# Patient Record
Sex: Male | Born: 1955 | Race: White | Hispanic: No | Marital: Married | Smoking: Former smoker
Health system: Southern US, Community
[De-identification: ages and names within clinical notes are randomized; demographics above are authoritative.]

## PROBLEM LIST (undated history)

## (undated) DIAGNOSIS — I10 Essential (primary) hypertension: Secondary | ICD-10-CM

## (undated) DIAGNOSIS — O99330 Smoking (tobacco) complicating pregnancy, unspecified trimester: Secondary | ICD-10-CM

## (undated) DIAGNOSIS — Z9911 Dependence on respirator [ventilator] status: Secondary | ICD-10-CM

## (undated) DIAGNOSIS — J9610 Chronic respiratory failure, unspecified whether with hypoxia or hypercapnia: Secondary | ICD-10-CM

## (undated) DIAGNOSIS — J151 Pneumonia due to Pseudomonas: Secondary | ICD-10-CM

## (undated) DIAGNOSIS — F32A Depression, unspecified: Secondary | ICD-10-CM

## (undated) DIAGNOSIS — M549 Dorsalgia, unspecified: Secondary | ICD-10-CM

## (undated) DIAGNOSIS — F329 Major depressive disorder, single episode, unspecified: Secondary | ICD-10-CM

## (undated) DIAGNOSIS — F419 Anxiety disorder, unspecified: Secondary | ICD-10-CM

## (undated) DIAGNOSIS — J449 Chronic obstructive pulmonary disease, unspecified: Secondary | ICD-10-CM

## (undated) DIAGNOSIS — K567 Ileus, unspecified: Secondary | ICD-10-CM

---
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
## 2019-10-20 ENCOUNTER — Emergency Department (HOSPITAL_COMMUNITY): Payer: Medicare Other

## 2019-10-20 ENCOUNTER — Inpatient Hospital Stay (HOSPITAL_COMMUNITY)
Admission: EM | Admit: 2019-10-20 | Discharge: 2019-12-02 | DRG: 853 | Disposition: E | Payer: Medicare Other | Attending: Student in an Organized Health Care Education/Training Program | Admitting: Student in an Organized Health Care Education/Training Program

## 2019-10-20 DIAGNOSIS — A419 Sepsis, unspecified organism: Principal | ICD-10-CM | POA: Diagnosis present

## 2019-10-20 DIAGNOSIS — I358 Other nonrheumatic aortic valve disorders: Secondary | ICD-10-CM | POA: Diagnosis present

## 2019-10-20 DIAGNOSIS — I480 Paroxysmal atrial fibrillation: Secondary | ICD-10-CM | POA: Diagnosis present

## 2019-10-20 DIAGNOSIS — I9589 Other hypotension: Secondary | ICD-10-CM | POA: Diagnosis present

## 2019-10-20 DIAGNOSIS — E871 Hypo-osmolality and hyponatremia: Secondary | ICD-10-CM | POA: Diagnosis present

## 2019-10-20 DIAGNOSIS — Z930 Tracheostomy status: Secondary | ICD-10-CM | POA: Diagnosis not present

## 2019-10-20 DIAGNOSIS — K8020 Calculus of gallbladder without cholecystitis without obstruction: Secondary | ICD-10-CM | POA: Diagnosis present

## 2019-10-20 DIAGNOSIS — J9620 Acute and chronic respiratory failure, unspecified whether with hypoxia or hypercapnia: Secondary | ICD-10-CM | POA: Diagnosis not present

## 2019-10-20 DIAGNOSIS — J9601 Acute respiratory failure with hypoxia: Secondary | ICD-10-CM

## 2019-10-20 DIAGNOSIS — J156 Pneumonia due to other aerobic Gram-negative bacteria: Secondary | ICD-10-CM | POA: Diagnosis not present

## 2019-10-20 DIAGNOSIS — J9611 Chronic respiratory failure with hypoxia: Secondary | ICD-10-CM | POA: Diagnosis not present

## 2019-10-20 DIAGNOSIS — I959 Hypotension, unspecified: Secondary | ICD-10-CM | POA: Diagnosis not present

## 2019-10-20 DIAGNOSIS — X58XXXA Exposure to other specified factors, initial encounter: Secondary | ICD-10-CM | POA: Diagnosis not present

## 2019-10-20 DIAGNOSIS — Z801 Family history of malignant neoplasm of trachea, bronchus and lung: Secondary | ICD-10-CM

## 2019-10-20 DIAGNOSIS — F3289 Other specified depressive episodes: Secondary | ICD-10-CM | POA: Diagnosis present

## 2019-10-20 DIAGNOSIS — E875 Hyperkalemia: Secondary | ICD-10-CM | POA: Diagnosis not present

## 2019-10-20 DIAGNOSIS — I493 Ventricular premature depolarization: Secondary | ICD-10-CM | POA: Diagnosis present

## 2019-10-20 DIAGNOSIS — R451 Restlessness and agitation: Secondary | ICD-10-CM | POA: Diagnosis not present

## 2019-10-20 DIAGNOSIS — E785 Hyperlipidemia, unspecified: Secondary | ICD-10-CM | POA: Diagnosis present

## 2019-10-20 DIAGNOSIS — E872 Acidosis, unspecified: Secondary | ICD-10-CM

## 2019-10-20 DIAGNOSIS — Z8701 Personal history of pneumonia (recurrent): Secondary | ICD-10-CM

## 2019-10-20 DIAGNOSIS — Z931 Gastrostomy status: Secondary | ICD-10-CM | POA: Diagnosis not present

## 2019-10-20 DIAGNOSIS — I700 Atherosclerosis of aorta: Secondary | ICD-10-CM | POA: Diagnosis present

## 2019-10-20 DIAGNOSIS — I10 Essential (primary) hypertension: Secondary | ICD-10-CM | POA: Diagnosis present

## 2019-10-20 DIAGNOSIS — K56609 Unspecified intestinal obstruction, unspecified as to partial versus complete obstruction: Secondary | ICD-10-CM | POA: Diagnosis not present

## 2019-10-20 DIAGNOSIS — J9809 Other diseases of bronchus, not elsewhere classified: Secondary | ICD-10-CM | POA: Diagnosis not present

## 2019-10-20 DIAGNOSIS — S80211A Abrasion, right knee, initial encounter: Secondary | ICD-10-CM | POA: Diagnosis not present

## 2019-10-20 DIAGNOSIS — E876 Hypokalemia: Secondary | ICD-10-CM | POA: Diagnosis present

## 2019-10-20 DIAGNOSIS — T17890A Other foreign object in other parts of respiratory tract causing asphyxiation, initial encounter: Secondary | ICD-10-CM | POA: Diagnosis not present

## 2019-10-20 DIAGNOSIS — E861 Hypovolemia: Secondary | ICD-10-CM | POA: Diagnosis present

## 2019-10-20 DIAGNOSIS — R140 Abdominal distension (gaseous): Secondary | ICD-10-CM | POA: Diagnosis present

## 2019-10-20 DIAGNOSIS — J9811 Atelectasis: Secondary | ICD-10-CM | POA: Diagnosis present

## 2019-10-20 DIAGNOSIS — K56600 Partial intestinal obstruction, unspecified as to cause: Secondary | ICD-10-CM | POA: Diagnosis not present

## 2019-10-20 DIAGNOSIS — Z9889 Other specified postprocedural states: Secondary | ICD-10-CM

## 2019-10-20 DIAGNOSIS — G8929 Other chronic pain: Secondary | ICD-10-CM | POA: Diagnosis present

## 2019-10-20 DIAGNOSIS — Z20822 Contact with and (suspected) exposure to covid-19: Secondary | ICD-10-CM | POA: Diagnosis not present

## 2019-10-20 DIAGNOSIS — Z9911 Dependence on respirator [ventilator] status: Secondary | ICD-10-CM | POA: Diagnosis not present

## 2019-10-20 DIAGNOSIS — F329 Major depressive disorder, single episode, unspecified: Secondary | ICD-10-CM | POA: Diagnosis not present

## 2019-10-20 DIAGNOSIS — J95851 Ventilator associated pneumonia: Secondary | ICD-10-CM | POA: Diagnosis not present

## 2019-10-20 DIAGNOSIS — E870 Hyperosmolality and hypernatremia: Secondary | ICD-10-CM | POA: Diagnosis not present

## 2019-10-20 DIAGNOSIS — J9690 Respiratory failure, unspecified, unspecified whether with hypoxia or hypercapnia: Secondary | ICD-10-CM | POA: Diagnosis not present

## 2019-10-20 DIAGNOSIS — S80219A Abrasion, unspecified knee, initial encounter: Secondary | ICD-10-CM | POA: Diagnosis not present

## 2019-10-20 DIAGNOSIS — E162 Hypoglycemia, unspecified: Secondary | ICD-10-CM | POA: Diagnosis present

## 2019-10-20 DIAGNOSIS — J440 Chronic obstructive pulmonary disease with acute lower respiratory infection: Secondary | ICD-10-CM | POA: Diagnosis not present

## 2019-10-20 DIAGNOSIS — J9622 Acute and chronic respiratory failure with hypercapnia: Secondary | ICD-10-CM | POA: Diagnosis present

## 2019-10-20 DIAGNOSIS — Z515 Encounter for palliative care: Secondary | ICD-10-CM | POA: Diagnosis not present

## 2019-10-20 DIAGNOSIS — Y848 Other medical procedures as the cause of abnormal reaction of the patient, or of later complication, without mention of misadventure at the time of the procedure: Secondary | ICD-10-CM | POA: Diagnosis present

## 2019-10-20 DIAGNOSIS — J690 Pneumonitis due to inhalation of food and vomit: Secondary | ICD-10-CM | POA: Diagnosis not present

## 2019-10-20 DIAGNOSIS — I4891 Unspecified atrial fibrillation: Secondary | ICD-10-CM | POA: Diagnosis not present

## 2019-10-20 DIAGNOSIS — I878 Other specified disorders of veins: Secondary | ICD-10-CM | POA: Diagnosis present

## 2019-10-20 DIAGNOSIS — J189 Pneumonia, unspecified organism: Secondary | ICD-10-CM

## 2019-10-20 DIAGNOSIS — J9600 Acute respiratory failure, unspecified whether with hypoxia or hypercapnia: Secondary | ICD-10-CM

## 2019-10-20 DIAGNOSIS — E860 Dehydration: Secondary | ICD-10-CM | POA: Diagnosis present

## 2019-10-20 DIAGNOSIS — R000 Tachycardia, unspecified: Secondary | ICD-10-CM | POA: Diagnosis not present

## 2019-10-20 DIAGNOSIS — L89151 Pressure ulcer of sacral region, stage 1: Secondary | ICD-10-CM | POA: Diagnosis not present

## 2019-10-20 DIAGNOSIS — M545 Low back pain: Secondary | ICD-10-CM | POA: Diagnosis present

## 2019-10-20 DIAGNOSIS — J9621 Acute and chronic respiratory failure with hypoxia: Secondary | ICD-10-CM | POA: Diagnosis not present

## 2019-10-20 DIAGNOSIS — F419 Anxiety disorder, unspecified: Secondary | ICD-10-CM | POA: Diagnosis not present

## 2019-10-20 DIAGNOSIS — K567 Ileus, unspecified: Secondary | ICD-10-CM

## 2019-10-20 DIAGNOSIS — Z8249 Family history of ischemic heart disease and other diseases of the circulatory system: Secondary | ICD-10-CM

## 2019-10-20 DIAGNOSIS — R0602 Shortness of breath: Secondary | ICD-10-CM

## 2019-10-20 DIAGNOSIS — J449 Chronic obstructive pulmonary disease, unspecified: Secondary | ICD-10-CM | POA: Diagnosis not present

## 2019-10-20 HISTORY — DX: Ileus, unspecified: K56.7

## 2019-10-20 HISTORY — DX: Essential (primary) hypertension: I10

## 2019-10-20 HISTORY — DX: Depression, unspecified: F32.A

## 2019-10-20 HISTORY — DX: Chronic obstructive pulmonary disease, unspecified: J44.9

## 2019-10-20 HISTORY — DX: Smoking (tobacco) complicating pregnancy, unspecified trimester: O99.330

## 2019-10-20 HISTORY — DX: Chronic respiratory failure, unspecified whether with hypoxia or hypercapnia: J96.10

## 2019-10-20 HISTORY — DX: Dependence on respirator (ventilator) status: Z99.11

## 2019-10-20 HISTORY — DX: Dorsalgia, unspecified: M54.9

## 2019-10-20 HISTORY — DX: Pneumonia due to Pseudomonas: J15.1

## 2019-10-20 HISTORY — DX: Anxiety disorder, unspecified: F41.9

## 2019-10-20 LAB — POCT I-STAT 7, (LYTES, BLD GAS, ICA,H+H)
Acid-Base Excess: 6 mmol/L — ABNORMAL HIGH (ref 0.0–2.0)
Bicarbonate: 29.1 mmol/L — ABNORMAL HIGH (ref 20.0–28.0)
Calcium, Ion: 1.11 mmol/L — ABNORMAL LOW (ref 1.15–1.40)
HCT: 42 % (ref 39.0–52.0)
Hemoglobin: 14.3 g/dL (ref 13.0–17.0)
O2 Saturation: 93 %
Patient temperature: 98.6
Potassium: 2.8 mmol/L — ABNORMAL LOW (ref 3.5–5.1)
Sodium: 123 mmol/L — ABNORMAL LOW (ref 135–145)
TCO2: 30 mmol/L (ref 22–32)
pCO2 arterial: 36.4 mmHg (ref 32.0–48.0)
pH, Arterial: 7.511 — ABNORMAL HIGH (ref 7.350–7.450)
pO2, Arterial: 59 mmHg — ABNORMAL LOW (ref 83.0–108.0)

## 2019-10-20 LAB — CBC WITH DIFFERENTIAL/PLATELET
Abs Immature Granulocytes: 0.06 10*3/uL (ref 0.00–0.07)
Basophils Absolute: 0.1 10*3/uL (ref 0.0–0.1)
Basophils Relative: 1 %
Eosinophils Absolute: 0.2 10*3/uL (ref 0.0–0.5)
Eosinophils Relative: 2 %
HCT: 43.5 % (ref 39.0–52.0)
Hemoglobin: 14.6 g/dL (ref 13.0–17.0)
Immature Granulocytes: 1 %
Lymphocytes Relative: 22 %
Lymphs Abs: 2.3 10*3/uL (ref 0.7–4.0)
MCH: 31.1 pg (ref 26.0–34.0)
MCHC: 33.6 g/dL (ref 30.0–36.0)
MCV: 92.8 fL (ref 80.0–100.0)
Monocytes Absolute: 1.2 10*3/uL — ABNORMAL HIGH (ref 0.1–1.0)
Monocytes Relative: 11 %
Neutro Abs: 6.6 10*3/uL (ref 1.7–7.7)
Neutrophils Relative %: 63 %
Platelets: 359 10*3/uL (ref 150–400)
RBC: 4.69 MIL/uL (ref 4.22–5.81)
RDW: 13 % (ref 11.5–15.5)
WBC: 10.3 10*3/uL (ref 4.0–10.5)
nRBC: 0 % (ref 0.0–0.2)

## 2019-10-20 LAB — COMPREHENSIVE METABOLIC PANEL
ALT: 29 U/L (ref 0–44)
AST: 27 U/L (ref 15–41)
Albumin: 2.9 g/dL — ABNORMAL LOW (ref 3.5–5.0)
Alkaline Phosphatase: 126 U/L (ref 38–126)
Anion gap: 16 — ABNORMAL HIGH (ref 5–15)
BUN: 48 mg/dL — ABNORMAL HIGH (ref 8–23)
CO2: 29 mmol/L (ref 22–32)
Calcium: 8.9 mg/dL (ref 8.9–10.3)
Chloride: 80 mmol/L — ABNORMAL LOW (ref 98–111)
Creatinine, Ser: 0.8 mg/dL (ref 0.61–1.24)
GFR calc Af Amer: >60 mL/min (ref >60)
GFR calc non Af Amer: >60 mL/min (ref >60)
Glucose, Bld: 53 mg/dL — ABNORMAL LOW (ref 70–99)
Potassium: 3.2 mmol/L — ABNORMAL LOW (ref 3.5–5.1)
Sodium: 125 mmol/L — ABNORMAL LOW (ref 135–145)
Total Bilirubin: 1.6 mg/dL — ABNORMAL HIGH (ref 0.3–1.2)
Total Protein: 7.7 g/dL (ref 6.5–8.1)

## 2019-10-20 LAB — BRAIN NATRIURETIC PEPTIDE: B Natriuretic Peptide: 403 pg/mL — ABNORMAL HIGH (ref 0.0–100.0)

## 2019-10-20 LAB — URINALYSIS, ROUTINE W REFLEX MICROSCOPIC
Bilirubin Urine: NEGATIVE
Glucose, UA: NEGATIVE mg/dL
Hgb urine dipstick: NEGATIVE
Ketones, ur: NEGATIVE mg/dL
Leukocytes,Ua: NEGATIVE
Nitrite: NEGATIVE
Protein, ur: NEGATIVE mg/dL
Specific Gravity, Urine: 1.006 (ref 1.005–1.030)
pH: 6 (ref 5.0–8.0)

## 2019-10-20 LAB — PROTIME-INR
INR: 1.1 (ref 0.8–1.2)
Prothrombin Time: 14.4 seconds (ref 11.4–15.2)

## 2019-10-20 LAB — LACTIC ACID, PLASMA
Lactic Acid, Venous: 4.2 mmol/L (ref 0.5–1.9)
Lactic Acid, Venous: 4.4 mmol/L (ref 0.5–1.9)

## 2019-10-20 LAB — TROPONIN I (HIGH SENSITIVITY): Troponin I (High Sensitivity): 20 ng/L — ABNORMAL HIGH (ref <18)

## 2019-10-20 LAB — CBG MONITORING, ED
Glucose-Capillary: 65 mg/dL — ABNORMAL LOW (ref 70–99)
Glucose-Capillary: 112 mg/dL — ABNORMAL HIGH (ref 70–99)

## 2019-10-20 LAB — APTT: aPTT: 35 seconds (ref 24–36)

## 2019-10-20 LAB — MAGNESIUM: Magnesium: 1.5 mg/dL — ABNORMAL LOW (ref 1.7–2.4)

## 2019-10-20 MED ORDER — LACTATED RINGERS IV BOLUS
1000.0000 mL | Freq: Once | INTRAVENOUS | Status: AC
  Administered 2019-10-20: 1000 mL via INTRAVENOUS

## 2019-10-20 MED ORDER — SODIUM CHLORIDE 0.9 % IV SOLN
2.0000 g | Freq: Three times a day (TID) | INTRAVENOUS | Status: DC
  Administered 2019-10-21 – 2019-10-24 (×10): 2 g via INTRAVENOUS
  Filled 2019-10-20 (×12): qty 2

## 2019-10-20 MED ORDER — SODIUM CHLORIDE 0.9 % IV SOLN
2.0000 g | Freq: Once | INTRAVENOUS | Status: AC
  Administered 2019-10-20: 2 g via INTRAVENOUS
  Filled 2019-10-20: qty 2

## 2019-10-20 MED ORDER — MAGNESIUM SULFATE 2 GM/50ML IV SOLN
2.0000 g | Freq: Once | INTRAVENOUS | Status: AC
  Administered 2019-10-20: 2 g via INTRAVENOUS
  Filled 2019-10-20: qty 50

## 2019-10-20 MED ORDER — FENTANYL CITRATE (PF) 100 MCG/2ML IJ SOLN
50.0000 ug | Freq: Once | INTRAMUSCULAR | Status: AC
  Administered 2019-10-20: 50 ug via INTRAVENOUS
  Filled 2019-10-20: qty 2

## 2019-10-20 MED ORDER — POTASSIUM CHLORIDE 10 MEQ/100ML IV SOLN
10.0000 meq | Freq: Once | INTRAVENOUS | Status: AC
  Administered 2019-10-20: 10 meq via INTRAVENOUS
  Filled 2019-10-20: qty 100

## 2019-10-20 MED ORDER — VANCOMYCIN HCL IN DEXTROSE 1-5 GM/200ML-% IV SOLN
1000.0000 mg | Freq: Once | INTRAVENOUS | Status: AC
  Administered 2019-10-20: 1000 mg via INTRAVENOUS
  Filled 2019-10-20: qty 200

## 2019-10-20 MED ORDER — VANCOMYCIN HCL 1250 MG/250ML IV SOLN
1250.0000 mg | Freq: Two times a day (BID) | INTRAVENOUS | Status: DC
  Administered 2019-10-21 – 2019-10-22 (×3): 1250 mg via INTRAVENOUS
  Filled 2019-10-20 (×6): qty 250

## 2019-10-20 MED ORDER — METRONIDAZOLE IN NACL 5-0.79 MG/ML-% IV SOLN
500.0000 mg | Freq: Once | INTRAVENOUS | Status: AC
  Administered 2019-10-20: 500 mg via INTRAVENOUS
  Filled 2019-10-20: qty 100

## 2019-10-20 MED ORDER — NOREPINEPHRINE 4 MG/250ML-% IV SOLN: 0.0000 ug/min | INTRAVENOUS | Status: DC

## 2019-10-20 MED ORDER — DEXTROSE 50 % IV SOLN
50.0000 mL | Freq: Once | INTRAVENOUS | Status: AC
  Administered 2019-10-20: 50 mL via INTRAVENOUS
  Filled 2019-10-20: qty 50

## 2019-10-20 NOTE — ED Triage Notes (Signed)
Pt arrives via [facility] from [facility] with levophed already started due to hypotension of 70 systolic. Per [facility], pt has been hypotensive all day despite 1L NS bolus that was given. Pt transferred to [facility] from [facility] after failing bipap for hypercapnia and hypoxia. Hx COPD. Pt was intubated, extubated, intubated and then trach with vent dependence. New onset afib rvr yesterday and given metoprolol to try to correct. Pt also with + ileus, NG tube placed.  HR 120 afib 94% vent 110/63 on levophed

## 2019-10-20 NOTE — Consult Note (Signed)
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  This improved with IV fluids.  Imaging right lower lobe consolidation.  Admit to progressive care with PCCM consult for vent management  History of present illness   Brady [name] is a 64 year old male who had Pseudomonas/Serratia pneumonia at [facility] in February 2021 and required tracheostomy.  He was transferred to [facility] and remains vent dependent.  Patient apparently developed an ileus over the last several days and then atrial fibrillation today.  He was given metoprolol (unclear dose) and developed hypotension.  He was given 1 L IV fluids, started on Levophed and transferred to the emergency department.   In the emergency department, he has not required any further vasopressors.  CT chest shows complete collapse of the right lower lobe with right upper lobe consolidation concerning for aspiration.  He was covered with with broad-spectrum antibiotics with vancomycin, cefepime and Flagyl.  CT abdomen pelvis shows ileus.  His overall status was stable, therefore internal medicine consulted for admission and PCCM consulted for vent management.  Covid-19 results are pending  Past Medical History  Spinal stenosis, hepatitis C, hypertension, chronic respiratory failure trach and vent dependent  Significant Hospital Events   3/19-admit to progressive care  Consults:  PCCM  Procedures:    Significant Diagnostic Tests:  3/19 CT abdomen pelvis>>Mildly dilated fluid-filled loops of proximal and mid small bowel with transition to decompressed distal and terminal ileum suggestive  of low-grade bowel obstruction 3/19 CT chest>>Complete collapse of the RLL secondary to aspirated debris within the RLL bronchus. Consolidation in the posterior segments of the RUL concerning for aspiration.Cholelithiasis with questionable gallbladder wall thickening.   Micro Data:  3/19 SARS-Covid-2>> pending 3/19 urine culture>>  3/19 blood cultures x2>>  Antimicrobials:  Cefepime 3/19- Flagyl 3/19- Vancomycin 3/19-  Interim history/subjective:  Patient's blood pressure improved, awake and alert, remains in atrial fibrillation without respiratory distress  Objective   Blood pressure 116/86, pulse (!) 113, temperature 98.8 F (37.1 C), resp. rate (!) 22, height 5\' 8"  (1.727 m), weight 103 kg, SpO2 93 %.    Vent Mode: PRVC FiO2 (%):  [35 %] 35 % Set Rate:  [12 bmp] 12 bmp Vt Set:  [500 mL] 500 mL PEEP:  [5 cmH20] 5 cmH20  No intake or output data in the 24 hours ending 11-06-19 2354 Filed Weights   11-06-19 2125  Weight: 103 kg   General: Chronically ill-appearing male in no acute distress HEENT: MM pink/moist, trach in place Neuro: Awake and responsive CV: s1s2 regular rate and tachycardic, no m/r/g PULM: Decreased air movement right upper and lower lobes GI: soft, bsx4 active  Extremities: warm/dry, no edema  Skin: no rashes or lesions   Resolved Hospital Problem list   Hypotension  Assessment & Plan:    Right lower lobe collapse, chronic trach and vent dependent -Likely mucous plugging and aspiration event, history of Serratia and Pseudomonas pneumonia -ABG 7.5/36.4/59/29.1 on baseline vent setting and 35% FiO2 P: -Agree with broad antibiotic coverage with Vanco and cefepime and blood cultures -Obtain respiratory culture, would likely benefit from bronchoscopy, will attempt to complete overnight -Pulmonary toilet -Increase FiO2 to 50% and repeat ABG  in the morning -titrate Vent setting to maintain SpO2 greater than or equal to 90%. -HOB elevated 30  degrees. -Plateau pressures less than 30 cm H20.  -Follow chest x-ray, ABG prn.   -Bronchial hygiene and RT/bronchodilator protocol.  Atrial fibrillation -Appears to be new onset, possibly secondary to developing sepsis in the setting of aspiration pneumonia -Unclear how much metoprolol patient was given prior to hypotension before arrival P: -Management per primary team, consider echo, TSH, cards consult -CHA2DS-VASc2 score is 1  -suggest low dose Metoprolol 6.25 mg twice daily or cardizem gtt if RVR uncontrolled and BP will tolerate   Ileus -Recommend NG tube to gravity -N.p.o. -Bowel regimen  Best practice:  Diet: N.p.o. Pain/Anxiety/Delirium protocol (if indicated): N/A VAP protocol (if indicated): Head of bed 30 degrees DVT prophylaxis: Lovenox GI prophylaxis: N/A Glucose control: N/A Mobility: Bedrest Code Status: Full code Family Communication: Per primary Disposition: Progressive care  Labs   CBC: Recent Labs  Lab 10/05/2019 1902 10/19/2019 1916  WBC  --  10.3  NEUTROABS  --  6.6  HGB 14.3 14.6  HCT 42.0 43.5  MCV  --  92.8  PLT  --  359    Basic Metabolic Panel: Recent Labs  Lab 10/18/2019 1902 10/16/2019 1916  NA 123* 125*  K 2.8* 3.2*  CL  --  80*  CO2  --  29  GLUCOSE  --  53*  BUN  --  48*  CREATININE  --  0.80  CALCIUM  --  8.9  MG  --  1.5*   GFR: Estimated Creatinine Clearance: 108.5 mL/min (by C-G formula based on SCr of 0.8 mg/dL). Recent Labs  Lab 10/07/2019 1916 10/09/2019 2112  WBC 10.3  --   LATICACIDVEN 4.2* 4.4*    Liver Function Tests: Recent Labs  Lab 10/30/2019 1916  AST 27  ALT 29  ALKPHOS 126  BILITOT 1.6*  PROT 7.7  ALBUMIN 2.9*   No results for input(s): LIPASE, AMYLASE in the last 168 hours. No results for input(s): AMMONIA in the last 168 hours.  ABG    Component Value Date/Time   PHART 7.511 (H) 10/13/2019 1902   PCO2ART 36.4 10/29/2019 1902   PO2ART 59.0 (L) 10/27/2019 1902   HCO3 29.1 (H) 10/21/2019 1902    TCO2 30 10/27/2019 1902   O2SAT 93.0 10/22/2019 1902     Coagulation Profile: Recent Labs  Lab 10/19/2019 1916  INR 1.1    Cardiac Enzymes: No results for input(s): CKTOTAL, CKMB, CKMBINDEX, TROPONINI in the last 168 hours.  HbA1C: No results found for: HGBA1C  CBG: Recent Labs  Lab 10/23/2019 2017 10/23/2019 2132  GLUCAP 65* 112*    Review of Systems:   Unable to obtain secondary to no Passy Marc Morgans  Past Medical History  He,  has no past medical history on file.   Surgical History     Social History      Family History   His family history is not on file.   Allergies Not on File   Home Medications  Prior to Admission medications   Not on File     Critical care time: 50 minutes     CRITICAL CARE Performed by: Darcella Gasman Denyce Harr   Total critical care time: 50 minutes  Critical care time was exclusive of separately billable procedures and treating other patients.  Critical care was necessary to treat or prevent imminent or life-threatening deterioration.  Critical care was time spent personally by me on the following  activities: development of treatment plan with patient and/or surrogate as well as nursing, discussions with consultants, evaluation of patient's response to treatment, examination of patient, obtaining history from patient or surrogate, ordering and performing treatments and interventions, ordering and review of laboratory studies, ordering and review of radiographic studies, pulse oximetry and re-evaluation of patient's condition.  Otilio Carpen Katherleen Folkes, PA-C

## 2019-10-20 NOTE — ED Provider Notes (Signed)
[facility] EMERGENCY DEPARTMENT Provider Note   CSN: [id number] Arrival date & time: 10/27/2019  1832  LEVEL 5 CAVEAT - NONVERBAL/TRACH  History Chief Complaint  Patient presents with  . Hypotension  . Atrial Fibrillation    Brady [name] is a 64 y.o. male.  HPI 64 year old male presents with atrial fibrillation from [facility].  Patient new to [facility] over the last month or so.  Over the last couple days he has developed an ileus and currently has his PEG tube and an NG set up to suction.  Apparently a lot of fluid has come out.  The patient developed A. fib yesterday according to discharge summary sent from [facility].  Was given metoprolol and fluids.  Developed hypotension that did not come up with fluids and so [facility] was called to transfer to the ER.  They placed on Levophed with good response.  He is vent dependent as he intermittently has apnea.  Further history is pretty limited.  No past medical history on file.  Patient Active Problem List   Diagnosis Date Noted  . Hypotension 10/19/2019         No family history on file.  Social History   Tobacco Use  . Smoking status: Not on file  Substance Use Topics  . Alcohol use: Not on file  . Drug use: Not on file    Home Medications Prior to Admission medications   Not on File    Allergies    Patient has no allergy information on record.  Review of Systems   Review of Systems  Unable to perform ROS: Patient nonverbal    Physical Exam Updated Vital Signs BP 120/84   Pulse (!) 130   Temp 98.8 F (37.1 C)   Resp (!) 22   Ht 5\' 8"  (1.727 m)   Wt 103 kg   SpO2 94%   BMI 34.53 kg/m   Physical Exam Vitals and nursing note reviewed.  Constitutional:      Appearance: He is well-developed.  HENT:     Head: Normocephalic and atraumatic.     Right Ear: External ear normal.     Left Ear: External ear normal.     Nose: Nose normal.  Eyes:     General:        Right eye: No discharge.         Left eye: No discharge.  Neck:     Comments: Tracheostomy in place Cardiovascular:     Rate and Rhythm: Tachycardia present. Rhythm irregular.     Heart sounds: Normal heart sounds.  Pulmonary:     Effort: Pulmonary effort is normal.     Breath sounds: Normal breath sounds.  Abdominal:     Palpations: Abdomen is soft.     Tenderness: There is no abdominal tenderness.  Musculoskeletal:     Cervical back: Neck supple.  Skin:    General: Skin is warm and dry.  Neurological:     Mental Status: He is alert.  Psychiatric:        Mood and Affect: Mood is not anxious.     ED Results / Procedures / Treatments   Labs (all labs ordered are listed, but only abnormal results are displayed) Labs Reviewed  LACTIC ACID, PLASMA - Abnormal; Notable for the following components:      Result Value   Lactic Acid, Venous 4.2 (*)    All other components within normal limits  LACTIC ACID, PLASMA - Abnormal; Notable for the following  components:   Lactic Acid, Venous 4.4 (*)    All other components within normal limits  COMPREHENSIVE METABOLIC PANEL - Abnormal; Notable for the following components:   Sodium 125 (*)    Potassium 3.2 (*)    Chloride 80 (*)    Glucose, Bld 53 (*)    BUN 48 (*)    Albumin 2.9 (*)    Total Bilirubin 1.6 (*)    Anion gap 16 (*)    All other components within normal limits  CBC WITH DIFFERENTIAL/PLATELET - Abnormal; Notable for the following components:   Monocytes Absolute 1.2 (*)    All other components within normal limits  MAGNESIUM - Abnormal; Notable for the following components:   Magnesium 1.5 (*)    All other components within normal limits  BRAIN NATRIURETIC PEPTIDE - Abnormal; Notable for the following components:   B Natriuretic Peptide 403.0 (*)    All other components within normal limits  POCT I-STAT 7, (LYTES, BLD GAS, ICA,H+H) - Abnormal; Notable for the following components:   pH, Arterial 7.511 (*)    pO2, Arterial 59.0 (*)     Bicarbonate 29.1 (*)    Acid-Base Excess 6.0 (*)    Sodium 123 (*)    Potassium 2.8 (*)    Calcium, Ion 1.11 (*)    All other components within normal limits  CBG MONITORING, ED - Abnormal; Notable for the following components:   Glucose-Capillary 65 (*)    All other components within normal limits  CBG MONITORING, ED - Abnormal; Notable for the following components:   Glucose-Capillary 112 (*)    All other components within normal limits  TROPONIN I (HIGH SENSITIVITY) - Abnormal; Notable for the following components:   Troponin I (High Sensitivity) 20 (*)    All other components within normal limits  CULTURE, BLOOD (ROUTINE X 2)  CULTURE, BLOOD (ROUTINE X 2)  URINE CULTURE  SARS CORONAVIRUS 2 (TAT 6-24 HRS)  APTT  PROTIME-INR  URINALYSIS, ROUTINE W REFLEX MICROSCOPIC  I-STAT ARTERIAL BLOOD GAS, ED  TROPONIN I (HIGH SENSITIVITY)    EKG EKG Interpretation  Date/Time:  Friday November 14, 2019 18:34:33 EDT Ventricular Rate:  115 PR Interval:    QRS Duration: 108 QT Interval:  342 QTC Calculation: 473 R Axis:   23 Text Interpretation: Atrial fibrillation Probable anteroseptal infarct, old Borderline repolarization abnormality No old tracing to compare Confirmed by Pricilla Loveless [phone]) on 11-14-2019 7:05:34 PM   Radiology CT ABDOMEN PELVIS WO CONTRAST  Result Date: 2019-11-14 CLINICAL DATA:  Nausea vomiting EXAM: CT ABDOMEN AND PELVIS WITHOUT CONTRAST TECHNIQUE: Multidetector CT imaging of the abdomen and pelvis was performed following the standard protocol without IV contrast. COMPARISON:  Chest x-ray 11/14/19 FINDINGS: Lower chest: Lung bases demonstrate consolidation within the right lower lobe. Normal heart size. Coronary vascular calcification Hepatobiliary: No focal hepatic abnormality. Slightly distended gallbladder with layering stones or sludge. No biliary dilatation Pancreas: Atrophic.  No inflammatory change Spleen: Normal in size without focal abnormality.  Adrenals/Urinary Tract: Adrenal glands are normal. Negative for hydronephrosis. The bladder is unremarkable Stomach/Bowel: Esophageal tube tip at the proximal duodenum. Gastrostomy tube in the distal stomach. Mildly dilated fluid-filled small bowel with gradual transition to decompressed distal small bowel and terminal ileum in the right lower quadrant of the abdomen, series 3, image number 44. No bowel wall thickening. Moderate stool in the colon. Negative appendix. Vascular/Lymphatic: Extensive aortic atherosclerosis. No aneurysm. No significant adenopathy Reproductive: Prostate is unremarkable. Other: Negative for free air or  free fluid. Musculoskeletal: Fixating screw at L5. Chronic appearing deformity at L3 with posterior wedging of the vertebral body. Advanced degenerative changes throughout the lumbar spine. Ghost tracks within the pedicles and vertebra from L1 to S1 consistent with prior surgical hardware and posterior decompression. Probable scar tissue within the posterior spinal region. Suspected 2 by 3.9 cm fluid collection within the posterior paraspinal soft tissues. IMPRESSION: 1. Consolidation in the right lower lobe which may be secondary to atelectasis, pneumonia, or aspiration. Central obstructing process is also possible as this is incompletely visualized. Consider dedicated chest CT for further evaluation. 2. Mildly dilated fluid-filled loops of proximal and mid small bowel with transition to decompressed distal and terminal ileum suggestive of low-grade bowel obstruction, less likely ileus. 3. Evidence of prior lumbar spine surgery and hardware removal. Soft tissue thickening within the posterior paraspinal soft tissues presumably due to postsurgical change; there is a 2 x 3.9 cm nonspecific fluid collection centrally within the soft tissue changes 4. Gallstones and or sludge. Electronically Signed   By: Jasmine PangKim  Fujinaga M.D.   On: 10/28/2019 20:31   CT Chest Wo Contrast  Result Date:  10/21/2019 CLINICAL DATA:  Shortness of breath.  Respiratory failure. EXAM: CT CHEST WITHOUT CONTRAST TECHNIQUE: Multidetector CT imaging of the chest was performed following the standard protocol without IV contrast. COMPARISON:  None. FINDINGS: Cardiovascular: The heart size is normal. Aortic calcifications are noted without evidence for thoracic aortic aneurysm. Advanced coronary artery calcifications are noted. There is no significant pericardial effusion. Mediastinum/Nodes: --No mediastinal or hilar lymphadenopathy. --No axillary lymphadenopathy. --No supraclavicular lymphadenopathy. --Normal thyroid gland. --there is an enteric tube that extends through the patient's esophagus and terminates in the region of the gastric pylorus. Lungs/Pleura: There is a tracheostomy tube in place that terminates above the carina. There is no pneumothorax. There is some atelectasis at the left lung base. There is essentially complete collapse of the right lower lobe which appears to be secondary to extensive endobronchial debris and plugging of the right lower lobe bronchus. There is some attenuation of the right middle lobe bronchus with persistent adequate aeration of the the right middle lobe. There is mild consolidation in the posterior segments of the right upper lobe, likely representing aspiration. Upper Abdomen: There is cholelithiasis with questionable gallbladder wall thickening. A gastrostomy tube is noted. Musculoskeletal: There is an old healed proximal right humerus fracture. There are advanced degenerative changes of the left glenohumeral joint. Review of the MIP images confirms the above findings. IMPRESSION: 1. Complete collapse of the right lower lobe secondary to aspirated debris within the right lower lobe bronchus. 2. Consolidation in the posterior segments of the right upper lobe concerning for aspiration. 3. Cholelithiasis with questionable gallbladder wall thickening. Correlation with ultrasound is  recommended. 4.  Aortic Atherosclerosis (ICD10-I70.0). Electronically Signed   By: Katherine Mantlehristopher  Green M.D.   On: 10/25/2019 22:45   DG Chest Port 1 View  Result Date: 10/29/2019 CLINICAL DATA:  Hypotension EXAM: PORTABLE CHEST 1 VIEW COMPARISON:  None. FINDINGS: The tracheostomy tube terminates above the carina. The tip of the enteric tube is not fully visualized on this study. The patient is rotated which significantly limits evaluation. There is suggestion of a right perihilar and right lower lung zone airspace opacity. There is no pneumothorax. There is mild vascular congestion. The cardiac silhouette appears to be at least mildly enlarged. Advanced degenerative changes are noted of the glenohumeral joints. IMPRESSION: 1. Limited study secondary to patient positioning. 2. Lines and tubes as  above. The tip of the enteric tube is not reliably identified on this study. 3. Possible right lower lobe and right perihilar airspace opacities. These would be better evaluated with a repeat radiograph with improved patient positioning. Hypotension Electronically Signed   By: Katherine Mantle M.D.   On: 11/03/2019 19:20    Procedures .Critical Care Performed by: Pricilla Loveless, MD Authorized by: Pricilla Loveless, MD   Critical care provider statement:    Critical care time (minutes):  45   Critical care time was exclusive of:  Separately billable procedures and treating other patients   Critical care was necessary to treat or prevent imminent or life-threatening deterioration of the following conditions:  Shock and dehydration   Critical care was time spent personally by me on the following activities:  Discussions with consultants, evaluation of patient's response to treatment, examination of patient, ordering and performing treatments and interventions, ordering and review of laboratory studies, ordering and review of radiographic studies, pulse oximetry, re-evaluation of patient's condition, obtaining  history from patient or surrogate and review of old charts   (including critical care time)  Medications Ordered in ED Medications  norepinephrine (LEVOPHED) 4mg  in premix infusion (0 mcg/min Intravenous Not Given 11/03/2019 2046)  ceFEPIme (MAXIPIME) 2 g in sodium chloride 0.9 % 100 mL IVPB (has no administration in time range)  vancomycin (VANCOREADY) IVPB 1250 mg/250 mL (has no administration in time range)  lactated ringers bolus 1,000 mL (0 mLs Intravenous Stopped 03-Nov-2019 2018)  ceFEPIme (MAXIPIME) 2 g in sodium chloride 0.9 % 100 mL IVPB (0 g Intravenous Stopped 11-03-19 2052)  metroNIDAZOLE (FLAGYL) IVPB 500 mg (0 mg Intravenous Stopped 2019-11-03 2018)  vancomycin (VANCOCIN) IVPB 1000 mg/200 mL premix (0 mg Intravenous Stopped 2019/11/03 2031)  potassium chloride 10 mEq in 100 mL IVPB (0 mEq Intravenous Stopped 11-03-19 2039)  vancomycin (VANCOCIN) IVPB 1000 mg/200 mL premix (0 mg Intravenous Stopped 11/03/2019 2130)  fentaNYL (SUBLIMAZE) injection 50 mcg (50 mcg Intravenous Given 11/03/19 2023)  magnesium sulfate IVPB 2 g 50 mL (0 g Intravenous Stopped 2019/11/03 2046)  lactated ringers bolus 1,000 mL (0 mLs Intravenous Stopped 11-03-19 2132)  dextrose 50 % solution 50 mL (50 mLs Intravenous Given November 03, 2019 2027)  potassium chloride 10 mEq in 100 mL IVPB (0 mEq Intravenous Stopped 03-Nov-2019 2234)  fentaNYL (SUBLIMAZE) injection 50 mcg (50 mcg Intravenous Given November 03, 2019 2223)  lactated ringers bolus 1,000 mL (0 mLs Intravenous Stopped Nov 03, 2019 2332)    ED Course  I have reviewed the triage vital signs and the nursing notes.  Pertinent labs & imaging results that were available during my care of the patient were reviewed by me and considered in my medical decision making (see chart for details).  Clinical Course as of Oct 20 17  Fri 2019-11-03  2110 D/w Dr. Oct 22, 2019. Pulm can manage vent, but otherwise would be fine for stepdown. Ask for more fluid (need weight - go to 30 cc/kg) and admission to  medicine. Feels it's probably more volume loss from ileus/SBO rather than sepsis.   [SG]    Clinical Course User Index [SG] Warrick Parisian, MD   MDM Rules/Calculators/A&P                      CT shows SBO. Given IVF. Given hypotension and unclear cause at initial onset, he was treated per sepsis protocol with broad antibiotics. When weight was placed he was given 3rd liter IVF. Quickly weaned of levophed prior to any  significant fluid administration.  CT chest added on given the abnormal chest findings.  I think he should keep the antibiotics given the possible pneumonia.  Otherwise, he is no longer hypotensive.  While he does have A. fib and this appears to be new, I do not think this is driving his hypotension and is likely a response to his hypokalemia and dehydration.  Internal medicine teaching service consulted and they will admit. Final Clinical Impression(s) / ED Diagnoses Final diagnoses:  Atrial fibrillation with RVR (HCC)  Lactic acidosis  Small bowel obstruction [facility])    Rx / DC Orders ED Discharge Orders    None       Sherwood Gambler, MD 10/21/19 [phone]

## 2019-10-20 NOTE — ED Notes (Signed)
Patient requested position change and to be adjusted in the bed. Multiple attempts made but patient remains unsatisfied. Had the secretary, Geraldine Contras order a bed with mattress for wounds.

## 2019-10-20 NOTE — ED Notes (Signed)
Admitting MD at bedside evaluating patient

## 2019-10-20 NOTE — Progress Notes (Signed)
Pharmacy Antibiotic Note  Brady [name] is a 64 y.o. male admitted on 10-22-19 with sepsis. Pharmacy has been consulted for vancomycin and cefepime dosing.  Pt is hypotensive and bradycardic on levophed from [facility].  WBC 10.3, Tmax 98.8, lactate 4.2  Plan: Vancomycin 2000mg  IV x1 loading dose then 1250mg  IV Q12h Goal trough 15-20 mcg/mL Goal AUC 400-550 Expected AUC: 533 SCr used: 0.8 Cefepime 2g IV Q8h F/u clinical progress, c/s, de-escalation, and LOT    Temp (24hrs), Avg:98.8 F (37.1 C), Min:98.8 F (37.1 C), Max:98.8 F (37.1 C)  Recent Labs  Lab 10/22/19 1916  WBC 10.3  CREATININE 0.80  LATICACIDVEN 4.2*    CrCl cannot be calculated (Unknown ideal weight.).    Not on File  Antimicrobials this admission: 3/19 vanc >>  3/19 cefepime >>  3/19 flagyl x1 >>  Dose adjustments this admission: N/A  Microbiology results: 3/19 BCx: sent 3/19 UCx: sent   Thank you for allowing pharmacy to be a part of this patient's care.  4/19, PharmD PGY1 Ambulatory Care Pharmacy Resident 22-Oct-2019 8:34 PM

## 2019-10-20 NOTE — ED Notes (Signed)
Troponin and BNP in process but haven't been clicked off

## 2019-10-20 NOTE — Progress Notes (Signed)
Transported to CT and back with RN at bedside. No complications.

## 2019-10-21 ENCOUNTER — Inpatient Hospital Stay (HOSPITAL_COMMUNITY): Payer: Medicare Other

## 2019-10-21 DIAGNOSIS — K56609 Unspecified intestinal obstruction, unspecified as to partial versus complete obstruction: Secondary | ICD-10-CM

## 2019-10-21 DIAGNOSIS — F329 Major depressive disorder, single episode, unspecified: Secondary | ICD-10-CM

## 2019-10-21 DIAGNOSIS — E872 Acidosis, unspecified: Secondary | ICD-10-CM

## 2019-10-21 DIAGNOSIS — J95851 Ventilator associated pneumonia: Secondary | ICD-10-CM

## 2019-10-21 DIAGNOSIS — E785 Hyperlipidemia, unspecified: Secondary | ICD-10-CM

## 2019-10-21 DIAGNOSIS — M545 Low back pain: Secondary | ICD-10-CM

## 2019-10-21 DIAGNOSIS — Z9911 Dependence on respirator [ventilator] status: Secondary | ICD-10-CM

## 2019-10-21 DIAGNOSIS — E861 Hypovolemia: Secondary | ICD-10-CM

## 2019-10-21 DIAGNOSIS — E876 Hypokalemia: Secondary | ICD-10-CM

## 2019-10-21 DIAGNOSIS — I10 Essential (primary) hypertension: Secondary | ICD-10-CM

## 2019-10-21 DIAGNOSIS — G8929 Other chronic pain: Secondary | ICD-10-CM

## 2019-10-21 DIAGNOSIS — J9811 Atelectasis: Secondary | ICD-10-CM

## 2019-10-21 DIAGNOSIS — Z87891 Personal history of nicotine dependence: Secondary | ICD-10-CM

## 2019-10-21 DIAGNOSIS — R196 Halitosis: Secondary | ICD-10-CM

## 2019-10-21 DIAGNOSIS — A419 Sepsis, unspecified organism: Principal | ICD-10-CM

## 2019-10-21 DIAGNOSIS — J9620 Acute and chronic respiratory failure, unspecified whether with hypoxia or hypercapnia: Secondary | ICD-10-CM

## 2019-10-21 DIAGNOSIS — K029 Dental caries, unspecified: Secondary | ICD-10-CM

## 2019-10-21 DIAGNOSIS — I4891 Unspecified atrial fibrillation: Secondary | ICD-10-CM

## 2019-10-21 DIAGNOSIS — E871 Hypo-osmolality and hyponatremia: Secondary | ICD-10-CM

## 2019-10-21 DIAGNOSIS — Z931 Gastrostomy status: Secondary | ICD-10-CM

## 2019-10-21 DIAGNOSIS — J449 Chronic obstructive pulmonary disease, unspecified: Secondary | ICD-10-CM

## 2019-10-21 DIAGNOSIS — E162 Hypoglycemia, unspecified: Secondary | ICD-10-CM

## 2019-10-21 LAB — BASIC METABOLIC PANEL
Anion gap: 17 — ABNORMAL HIGH (ref 5–15)
Anion gap: 11 (ref 5–15)
Anion gap: 15 (ref 5–15)
BUN: 33 mg/dL — ABNORMAL HIGH (ref 8–23)
BUN: 25 mg/dL — ABNORMAL HIGH (ref 8–23)
BUN: 24 mg/dL — ABNORMAL HIGH (ref 8–23)
CO2: 27 mmol/L (ref 22–32)
CO2: 32 mmol/L (ref 22–32)
CO2: 30 mmol/L (ref 22–32)
Calcium: 8.9 mg/dL (ref 8.9–10.3)
Calcium: 8.5 mg/dL — ABNORMAL LOW (ref 8.9–10.3)
Calcium: 8.7 mg/dL — ABNORMAL LOW (ref 8.9–10.3)
Chloride: 85 mmol/L — ABNORMAL LOW (ref 98–111)
Chloride: 90 mmol/L — ABNORMAL LOW (ref 98–111)
Chloride: 89 mmol/L — ABNORMAL LOW (ref 98–111)
Creatinine, Ser: 0.55 mg/dL — ABNORMAL LOW (ref 0.61–1.24)
Creatinine, Ser: 0.46 mg/dL — ABNORMAL LOW (ref 0.61–1.24)
Creatinine, Ser: 0.38 mg/dL — ABNORMAL LOW (ref 0.61–1.24)
GFR calc Af Amer: >60 mL/min (ref >60)
GFR calc Af Amer: >60 mL/min (ref >60)
GFR calc Af Amer: >60 mL/min (ref >60)
GFR calc non Af Amer: >60 mL/min (ref >60)
GFR calc non Af Amer: >60 mL/min (ref >60)
GFR calc non Af Amer: >60 mL/min (ref >60)
Glucose, Bld: 89 mg/dL (ref 70–99)
Glucose, Bld: 88 mg/dL (ref 70–99)
Glucose, Bld: 92 mg/dL (ref 70–99)
Potassium: 3.5 mmol/L (ref 3.5–5.1)
Potassium: 3.2 mmol/L — ABNORMAL LOW (ref 3.5–5.1)
Potassium: 3.1 mmol/L — ABNORMAL LOW (ref 3.5–5.1)
Sodium: 129 mmol/L — ABNORMAL LOW (ref 135–145)
Sodium: 133 mmol/L — ABNORMAL LOW (ref 135–145)
Sodium: 134 mmol/L — ABNORMAL LOW (ref 135–145)

## 2019-10-21 LAB — POCT I-STAT 7, (LYTES, BLD GAS, ICA,H+H)
Acid-Base Excess: 7 mmol/L — ABNORMAL HIGH (ref 0.0–2.0)
Bicarbonate: 30.5 mmol/L — ABNORMAL HIGH (ref 20.0–28.0)
Calcium, Ion: 1.14 mmol/L — ABNORMAL LOW (ref 1.15–1.40)
HCT: 39 % (ref 39.0–52.0)
Hemoglobin: 13.3 g/dL (ref 13.0–17.0)
O2 Saturation: 96 %
Patient temperature: 98.8
Potassium: 2.6 mmol/L — CL (ref 3.5–5.1)
Sodium: 129 mmol/L — ABNORMAL LOW (ref 135–145)
TCO2: 32 mmol/L (ref 22–32)
pCO2 arterial: 38.7 mmHg (ref 32.0–48.0)
pH, Arterial: 7.505 — ABNORMAL HIGH (ref 7.350–7.450)
pO2, Arterial: 77 mmHg — ABNORMAL LOW (ref 83.0–108.0)

## 2019-10-21 LAB — SODIUM, URINE, RANDOM: Sodium, Ur: <10 mmol/L

## 2019-10-21 LAB — TROPONIN I (HIGH SENSITIVITY): Troponin I (High Sensitivity): 18 ng/L — ABNORMAL HIGH (ref <18)

## 2019-10-21 LAB — MAGNESIUM: Magnesium: 1.7 mg/dL (ref 1.7–2.4)

## 2019-10-21 LAB — GLUCOSE, CAPILLARY
Glucose-Capillary: 81 mg/dL (ref 70–99)
Glucose-Capillary: 78 mg/dL (ref 70–99)

## 2019-10-21 LAB — SARS CORONAVIRUS 2 (TAT 6-24 HRS): SARS Coronavirus 2: NEGATIVE

## 2019-10-21 LAB — MRSA PCR SCREENING: MRSA by PCR: NEGATIVE

## 2019-10-21 LAB — OSMOLALITY: Osmolality: 277 mOsm/kg (ref 275–295)

## 2019-10-21 LAB — LACTIC ACID, PLASMA
Lactic Acid, Venous: 2.6 mmol/L (ref 0.5–1.9)
Lactic Acid, Venous: 2.5 mmol/L (ref 0.5–1.9)

## 2019-10-21 LAB — OSMOLALITY, URINE: Osmolality, Ur: 228 mOsm/kg — ABNORMAL LOW (ref 300–900)

## 2019-10-21 MED ORDER — FENTANYL CITRATE (PF) 100 MCG/2ML IJ SOLN
50.0000 ug | INTRAMUSCULAR | Status: DC | PRN
  Administered 2019-10-21 – 2019-10-22 (×2): 50 ug via INTRAVENOUS
  Filled 2019-10-21 (×5): qty 2

## 2019-10-21 MED ORDER — METOPROLOL SUCCINATE ER 25 MG PO TB24: 25.0000 mg | ORAL_TABLET | Freq: Every day | ORAL | Status: DC

## 2019-10-21 MED ORDER — FENTANYL CITRATE (PF) 100 MCG/2ML IJ SOLN: 50.0000 ug | INTRAMUSCULAR | Status: DC | PRN

## 2019-10-21 MED ORDER — FENTANYL CITRATE (PF) 100 MCG/2ML IJ SOLN
50.0000 ug | INTRAMUSCULAR | Status: DC | PRN
  Administered 2019-10-22 – 2019-10-23 (×20): 100 ug via INTRAVENOUS
  Filled 2019-10-21 (×18): qty 2

## 2019-10-21 MED ORDER — DEXTROSE IN LACTATED RINGERS 5 % IV SOLN: INTRAVENOUS | Status: DC

## 2019-10-21 MED ORDER — DEXTROSE 5 % IV SOLN: INTRAVENOUS | Status: AC

## 2019-10-21 MED ORDER — POTASSIUM CHLORIDE 10 MEQ/50ML IV SOLN
INTRAVENOUS | Status: AC
  Filled 2019-10-21: qty 50

## 2019-10-21 MED ORDER — CHLORHEXIDINE GLUCONATE CLOTH 2 % EX PADS
6.0000 | MEDICATED_PAD | Freq: Every day | CUTANEOUS | Status: DC
  Administered 2019-10-21 – 2019-10-23 (×2): 6 via TOPICAL

## 2019-10-21 MED ORDER — MIDAZOLAM HCL 2 MG/2ML IJ SOLN: 1.0000 mg | INTRAMUSCULAR | Status: DC | PRN

## 2019-10-21 MED ORDER — MAGNESIUM SULFATE IN D5W 1-5 GM/100ML-% IV SOLN
1.0000 g | Freq: Once | INTRAVENOUS | Status: AC
  Administered 2019-10-21: 1 g via INTRAVENOUS
  Filled 2019-10-21: qty 100

## 2019-10-21 MED ORDER — POTASSIUM CHLORIDE 10 MEQ/100ML IV SOLN
10.0000 meq | INTRAVENOUS | Status: AC
  Administered 2019-10-21 (×5): 10 meq via INTRAVENOUS
  Filled 2019-10-21 (×4): qty 100

## 2019-10-21 MED ORDER — METOPROLOL TARTRATE 5 MG/5ML IV SOLN
5.0000 mg | Freq: Once | INTRAVENOUS | Status: AC
  Administered 2019-10-21: 5 mg via INTRAVENOUS
  Filled 2019-10-21: qty 5

## 2019-10-21 MED ORDER — DOCUSATE SODIUM 50 MG/5ML PO LIQD
100.0000 mg | Freq: Two times a day (BID) | ORAL | Status: DC | PRN
  Administered 2019-11-12 (×2): 100 mg
  Filled 2019-10-21 (×2): qty 10

## 2019-10-21 MED ORDER — LACTATED RINGERS IV SOLN: INTRAVENOUS | Status: AC

## 2019-10-21 MED ORDER — FENTANYL CITRATE (PF) 100 MCG/2ML IJ SOLN
INTRAMUSCULAR | Status: AC
  Administered 2019-10-21: 100 ug
  Filled 2019-10-21: qty 2

## 2019-10-21 MED ORDER — DEXMEDETOMIDINE HCL IN NACL 400 MCG/100ML IV SOLN
0.0000 ug/kg/h | INTRAVENOUS | Status: DC
  Administered 2019-10-21: 0.2 ug/kg/h via INTRAVENOUS
  Filled 2019-10-21: qty 100

## 2019-10-21 MED ORDER — MIDAZOLAM HCL 2 MG/2ML IJ SOLN: 2.0000 mg | INTRAMUSCULAR | Status: DC | PRN

## 2019-10-21 MED ORDER — DIATRIZOATE MEGLUMINE & SODIUM 66-10 % PO SOLN
90.0000 mL | Freq: Once | ORAL | Status: AC
  Administered 2019-10-21: 90 mL via NASOGASTRIC
  Filled 2019-10-21: qty 90

## 2019-10-21 MED ORDER — MIDAZOLAM HCL 2 MG/2ML IJ SOLN
INTRAMUSCULAR | Status: AC
  Administered 2019-10-21: 0.5 mg
  Filled 2019-10-21: qty 2

## 2019-10-21 MED ORDER — LIDOCAINE HCL (PF) 2 % IJ SOLN
5.0000 mL | Freq: Once | INTRAMUSCULAR | Status: AC
  Administered 2019-10-21: 5 mL
  Filled 2019-10-21: qty 10

## 2019-10-21 MED ORDER — LACTATED RINGERS IV SOLN: INTRAVENOUS | Status: DC

## 2019-10-21 MED ORDER — NOREPINEPHRINE 4 MG/250ML-% IV SOLN
INTRAVENOUS | Status: AC
  Filled 2019-10-21: qty 250

## 2019-10-21 MED ORDER — FUROSEMIDE 10 MG/ML IJ SOLN
20.0000 mg | Freq: Once | INTRAMUSCULAR | Status: AC
  Administered 2019-10-21: 20 mg via INTRAVENOUS
  Filled 2019-10-21: qty 2

## 2019-10-21 MED ORDER — POTASSIUM CHLORIDE 10 MEQ/100ML IV SOLN
INTRAVENOUS | Status: AC
  Filled 2019-10-21: qty 100

## 2019-10-21 NOTE — ED Notes (Signed)
NG placed back to low intermittent suction

## 2019-10-21 NOTE — Progress Notes (Signed)
   Subjective:   Brady [name] states he is feeling fine, just tired. He denies abdominal pain. He states he is passing gas. He also endorses bowel movement but patient's nurse is not aware of BM overnight. He denies shortness of breath or cough and feels that he is breathing better after lavage.   Objective:  Vital signs in last 24 hours: Vitals:   10/21/19 1245 10/21/19 1330 10/21/19 1345 10/21/19 1400  BP: 119/63 (!) 103/55 104/65 (!) 104/57  Pulse: 86 82  89  Resp:      Temp:      SpO2: 100% 100%  100%  Weight:      Height:       Constitution: NAD, appears stated age HENT: NG tube and trach, Brady [name] Cardio: irregular rate & rhythm, no m/r/g, no LE edema  Respiratory: rhonchi bilaterally, trach in place on vent Abdominal: NTTP, soft, distended, +BS, peg tube in place MSK: moving all extremities Neuro: normal affect, a&ox3 Skin: peg site without erythema or purulent drainage  Assessment/Plan:  Active Problems:   Hypotension  64yo male w PMH vent dependent respiratory failure, HLD, atypical depression, COPD, chronic low back pain, and new PEG tube placement 10/05/2019 presenting from [facility] with SBO and new onset atrial fibrillation.    Small Bowel Obstruction Hypokalemia  CT findings suggestive of low grade bowel obstruction. NG tube placed and general surgery consulted.   - undergoing gastrograffin today  - otherwise SBO protocol with NGT to low intermittent wall suction and PEG to gravity - goal K > 4 and Mg > 2 - repleting   Hypovolemic Hyponatremia  Symptoms and urine studies consistent with hypovolemic hyponatremia initially requiring 3L of LR. Na 129 > 133.   - cont. LR 100 cc/hr - repeat afternoon Na  New Onset Atrial Fibrillation with RVR Rate controlled after 5mg  IV metop. Cont. Telemetry. Hold po metop for now with soft blood pressure.   Aspiration Pneumonia Vent Dependent Respiratory Failure   Underwent bronchoscopy and lavage with PCCM this morning with  improvement in respiratory status.  - cont. Vanc/cefepime - f/u post bronch cell count, culture, AFB, fungus  Metabolic Acidosis Likely secondary to dehydration. Improved with IVF.    VTE: SCDs IVF: LR Diet: NPO  Code: full   Dispo: Anticipated discharge pending clinical improvement.   , DO 10/21/2019, 2:34 PM Pager: [phone]

## 2019-10-21 NOTE — Progress Notes (Signed)
  Date: 10/21/2019  Patient name: [name]  Medical record number: [id number]  Date of birth: [birth year]        I have seen and evaluated this patient and I have discussed the plan of care with the house staff. Please see Dr. Al Decant note for complete details. I concur with her findings and plan.    Inez Catalina, MD 10/21/2019, 7:33 PM

## 2019-10-21 NOTE — Progress Notes (Signed)
Third LA came down to 2.6. Patient received orders per Code Sepsis protocol.

## 2019-10-21 NOTE — H&P (Addendum)
Date: 2019/11/02               Patient Name:  Brady [name] MRN: [id number]  DOB: [birth year] Age / Sex: 64 y.o., male   PCP: Lum Keas, Lovell Sheehan, MD         Medical Service: Internal Medicine Teaching Service         Attending Physician: Dr. Inez Catalina, MD    First Contact: Dr. Yetta Barre Pager: [phone]  Second Contact: Dr. Aviva Kluver Pager: [phone]       After Hours (After 5p/  First Contact Pager: [phone]  weekends / holidays): Second Contact Pager: [phone]   Chief Complaint: Abdominal Distension   History of Present Illness: Mr. Brady [name] is a 64 y/o male, with a PMH respiratoy failure with vent dependence, HTN, HLD, atypical depression, COPD, and chronic low back pain, who presents to the [facility] after concerns for an ileus from his LTAC, [facility]. Patient states that he feels comfortable at this time, but does state that his abdomen feels full, but is unable to pass gas or stool. He states that he was brought here because [facility] was worried about his abdominal distension, but did state that there was a change in his heart rhythm when he was being transferred. He states that he additionally has not had nutrition through his PEG tube or NGT for a week. Patient denies headaches, vision changes, nausea, vomiting, chest pain, palpitations, abdominal pain, or leg swelling. He does endorse back pain that is chronic in nature.  During the patient's ED course it was found that he had a low grade bowel obstruction vs ileus, lactic acidosis of 4.2->4.4, hypomagnesia of 1.5, hypokalemia of 3.2, hypoglycemia (65). He was treated with metoprolol for his A. Fib, NG tube placed.    Meds: No outpatient medications have been marked as taking for the 2019/11/02 encounter [facility] Encounter).     Allergies: Allergies as of 11-02-2019  . (Not on File)   No past medical history on file.  Family History: noncontributory   Social History:  Patient is married - Nonsmoker - Denies  ETOH use - Denies illicit drugs  Review of Systems: A complete ROS was negative except as per HPI.  Physical Exam: Blood pressure 127/79, pulse (!) 109, temperature 98.8 F (37.1 C), resp. rate (!) 24, height 5\' 8"  (1.727 m), weight 103 kg, SpO2 98 %. Physical Exam Vitals and nursing note reviewed.  Constitutional:      General: He is not in acute distress.    Appearance: He is obese. He is not ill-appearing or toxic-appearing.     Comments: Patient laying bed, able to answer questions, no acute distress.   HENT:     Head: Normocephalic and atraumatic.     Mouth/Throat:     Mouth: Mucous membranes are moist.     Comments: Multiple dental carries, halitosis  Tracheostomy intact.  Eyes:     General:        Right eye: No discharge.        Left eye: No discharge.     Conjunctiva/sclera: Conjunctivae normal.  Cardiovascular:     Rate and Rhythm: Regular rhythm. Tachycardia present.     Pulses: Normal pulses.     Heart sounds: Normal heart sounds. No murmur. No friction rub. No gallop.   Pulmonary:     Breath sounds: No wheezing, rhonchi or rales.     Comments: tachypneic Abdominal:     Palpations: Abdomen is soft.  Tenderness: There is abdominal tenderness. There is no guarding.     Comments: Diminished bowel sounds in all quadrants.  Peg tube in place with no signs of purulence, erythema, or erosion.  Tenderness to palpation in the LLQ and LUQ  Musculoskeletal:     Right lower leg: No edema.     Left lower leg: No edema.  Skin:    General: Skin is warm and dry.  Neurological:     Mental Status: He is alert and oriented to person, place, and time.  Psychiatric:        Behavior: Behavior normal.     EKG: personally reviewed my interpretation is Atrial Fibrillation   CXR: personally reviewed my interpretation is right lower lobe and R perihilar airspace opacities. Limited due to patient positioning.   CT Chest WO Contrast:  IMPRESSION: 1. Complete collapse of the  right lower lobe secondary to aspirated debris within the right lower lobe bronchus. 2. Consolidation in the posterior segments of the right upper lobe concerning for aspiration. 3. Cholelithiasis with questionable gallbladder wall thickening. Correlation with ultrasound is recommended. 4.  Aortic Atherosclerosis (ICD10-I70.0).  CT Abdomen Pelvis w/o Contrast:  1. Consolidation in the right lower lobe which may be secondary to atelectasis, pneumonia, or aspiration. Central obstructing process is also possible as this is incompletely visualized. Consider dedicated chest CT for further evaluation. 2. Mildly dilated fluid-filled loops of proximal and mid small bowel with transition to decompressed distal and terminal ileum suggestive of low-grade bowel obstruction, less likely ileus. 3. Evidence of prior lumbar spine surgery and hardware removal. Soft tissue thickening within the posterior paraspinal soft tissues presumably due to postsurgical change; there is a 2 x 3.9 cm nonspecific fluid collection centrally within the soft tissue changes 4. Gallstones and or sludge.  Assessment & Plan by Problem: Active Problems:   Hypotension Mr. Brady [name] is a 64 y/o male with a PMH of vent dependence due to respiratory failure, HLD,  atypical depression, COPD, and chronic low back pain, who presents to [facility] with Atrial fibrillation and a small bowel obstruction.   Patient presents to [facility] with a small bowel obstruction. Patient was recently placed with a PEG tube on 10/05/2019 and has not used his tube for the past week. While uncommon, recent PEG tube placement can lead to ileus formation, but his imaging seems to point more towards small bowel obstruction. While this may be the case for Mr. Brady [name], malignancies may also be considered in the differential.   Patient also comes to the ED with hypotension and A. Fibrillation. Patient has not been receiving nutrition, and was subsequently given 3L  of LR with resolution of his hypotension. Subsequently, patient found to have new onset A. Fib. Patient does have risk factors associated with A. Fib, including age over >62, COPD, and recent pneumonia. He additionally has electrolyte abnormalities like hypokalemia which can contribute to his A. Fib.   Small Bowel Obstruction:  Patient with recent PEG tube placement, presenting to the ED with SBO.  - NG Tube placed - Trending Lactic Acid - LR infusion 100 mL/hr - NPO for bowel rest - Correct electrolyte abnormalities as they occur - Blood Cultures  Hyponatremia:  Likely 2/2 to hypovolemia - Q4H BMP - LR infusion - Urine studies ordered.   Atrial Fibrillation:  CHADVASC: 1 - Metoprolol 25 oral   - Replenish Potassium  - Continue Telemetry   Aspiration Pneumonia: Findings concerning for Aspiration pneumonia on CT scan   - CT  imaging with concerns for aspiration pneumonia - Respiratory Culture - Continue Cefepime  - Continue Vancomycin 2g IV - Pulmonary Toilet   Vent Dependent Respiratory Failure:  - PCCM following for vent management.   Dispo: Admit patient to Inpatient with expected length of stay greater than 2 midnights.  Signed: Maudie Mercury, MD 2019-11-02, 10:25 PM

## 2019-10-21 NOTE — ED Notes (Signed)
Patient transferred to a hospital airbed for comfort , tracheostomy intact connected to ventilator ,  NGT/Peg tube intact , IV sites unremarkable , stool/urinary incontinence care provided , complete linen changed .

## 2019-10-21 NOTE — Progress Notes (Signed)
RT note: RT and RN transported vent patient from ED to 2H bed 16. Vital signs stable through out.

## 2019-10-21 NOTE — Consult Note (Addendum)
Brady [name] 1956/02/10  [id number].    Requesting MD: Dr. Sande Brothers Chief Complaint/Reason for Consult: SBO  HPI: Brady [name] is a 64 y.o. male with a history of HTN, HLD, COPD, chronic lower back pain, respiratory failure with vent dependent who presented to [facility] from [facility], for abdominal distention and new A. Fib.  Patient reports that over the last 1 week his abdomen has felt full and distended.  He has intermittently been able to pass flatus and did have a small BM yesterday morning. He denies any current nausea or abdominal pain.  He does have a PEG tube present that he states was placed approximately 3 weeks ago.  This is currently clamped.  He also has a NG tube in place with small amount of bilious output.  He denies any other previous abdominal surgeries then PEG as mentioned above.  During work-up in the ED he was found to have ileus versus low-grade bowel obstruction with dilated proximal/mid small bowel with transition to decompressed distal/terminal ileum.  Patient was also noted to have new onset A. fib that was treated with metoprolol.  He was found to be severely dehydrated requiring several boluses of fluid.  His CT chest showed right mainstem occlusion for which she underwent a bronchoscopy earlier this morning for.  General surgery was asked to see for SBO.  ROS: Review of Systems  Constitutional: Negative for chills and fever.  Respiratory: Positive for shortness of breath.   Cardiovascular: Negative for chest pain.  Gastrointestinal: Positive for constipation. Negative for abdominal pain, blood in stool, nausea and vomiting.  All other systems reviewed and are negative.   No family history on file.  No past medical history on file.  Surgical Hx  Hx of back surgery PEG tube placement   Social History:  has no history on file for tobacco, alcohol, and drug.  Allergies: Not on File  (Not in a hospital admission)  Vent Mode: PRVC FiO2 (%):  [35  %-50 %] 50 % Set Rate:  [12 bmp] 12 bmp Vt Set:  [500 mL] 500 mL PEEP:  [5 cmH20] 5 cmH20 Plateau Pressure:  [12 cmH20-22 cmH20] 12 cmH20   Physical Exam: Blood pressure 103/64, pulse 92, temperature 98.8 F (37.1 C), resp. rate (!) 21, height 5\' 8"  (1.727 m), weight 103 kg, SpO2 100 %. General: WD white male who is laying in bed in NAD HEENT: head is normocephalic, atraumatic.  Sclera are noninjected.  PERRL.  Ears and nose without any masses or lesions.  Mouth is pink and moist. Dentition poor. NGT in place with 100-200cc bilious output in cannister.  Heart: Irregular rate with rhythm. No obvious murmurs, gallops, or rubs noted.  Palpable pedal pulses bilaterally  Lungs: Distant breath sounds on the right. Clear breath sounds on left. Trach present on vent with settings as noted above.  Respiratory effort nonlabored Abd: Soft, NT/ND, +BS, no masses, hernias, or organomegaly. PEG tube in place and clamped.  MS: no BUE/BLE edema, calves soft and nontender Skin: warm and dry with no masses, lesions, or rashes Psych: A&Ox4 with an appropriate affect Neuro: cranial nerves grossly intact, moves upper extremities. Limited movement of lower extremities he reports from prior back surgery that is chronic, able to mouth words, though process intact   Results for orders placed or performed during the hospital encounter of 11/09/2019 (from the past 48 hour(s))  Blood Culture (routine x 2)     Status: None (Preliminary result)   Collection  Time: 16-Nov-2019  7:00 PM   Specimen: BLOOD RIGHT HAND  Result Value Ref Range   Specimen Description BLOOD RIGHT HAND    Special Requests      BOTTLES DRAWN AEROBIC AND ANAEROBIC Blood Culture results may not be optimal due to an inadequate volume of blood received in culture bottles   Culture      NO GROWTH < 12 HOURS Performed at [facility], [address] [place], [place] [address]    Report Status PENDING   I-STAT 7, (LYTES, BLD GAS, ICA, H+H)      Status: Abnormal   Collection Time: 11/16/2019  7:02 PM  Result Value Ref Range   pH, Arterial 7.511 (H) 7.350 - 7.450   pCO2 arterial 36.4 32.0 - 48.0 mmHg   pO2, Arterial 59.0 (L) 83.0 - 108.0 mmHg   Bicarbonate 29.1 (H) 20.0 - 28.0 mmol/L   TCO2 30 22 - 32 mmol/L   O2 Saturation 93.0 %   Acid-Base Excess 6.0 (H) 0.0 - 2.0 mmol/L   Sodium 123 (L) 135 - 145 mmol/L   Potassium 2.8 (L) 3.5 - 5.1 mmol/L   Calcium, Ion 1.11 (L) 1.15 - 1.40 mmol/L   HCT 42.0 39.0 - 52.0 %   Hemoglobin 14.3 13.0 - 17.0 g/dL   Patient temperature 43.3 F    Collection site RADIAL, ALLEN'S TEST ACCEPTABLE    Drawn by Operator    Sample type ARTERIAL   Lactic acid, plasma     Status: Abnormal   Collection Time: 11-16-19  7:16 PM  Result Value Ref Range   Lactic Acid, Venous 4.2 (HH) 0.5 - 1.9 mmol/L    Comment: CRITICAL RESULT CALLED TO, READ BACK BY AND VERIFIED WITHJudene Companion 1955 2019-11-16 D BRADLEY Performed at [facility], [address], [place], [place] [address]   Comprehensive metabolic panel     Status: Abnormal   Collection Time: November 16, 2019  7:16 PM  Result Value Ref Range   Sodium 125 (L) 135 - 145 mmol/L   Potassium 3.2 (L) 3.5 - 5.1 mmol/L   Chloride 80 (L) 98 - 111 mmol/L   CO2 29 22 - 32 mmol/L   Glucose, Bld 53 (L) 70 - 99 mg/dL    Comment: Glucose reference range applies only to samples taken after fasting for at least 8 hours.   BUN 48 (H) 8 - 23 mg/dL   Creatinine, Ser 8.41 0.61 - 1.24 mg/dL   Calcium 8.9 8.9 - 66.0 mg/dL   Total Protein 7.7 6.5 - 8.1 g/dL   Albumin 2.9 (L) 3.5 - 5.0 g/dL   AST 27 15 - 41 U/L   ALT 29 0 - 44 U/L   Alkaline Phosphatase 126 38 - 126 U/L   Total Bilirubin 1.6 (H) 0.3 - 1.2 mg/dL   GFR calc non Af Amer >60 >60 mL/min   GFR calc Af Amer >60 >60 mL/min   Anion gap 16 (H) 5 - 15    Comment: Performed at [facility], [address] [place], [place] [address]  CBC WITH DIFFERENTIAL     Status: Abnormal   Collection Time: November 16, 2019   7:16 PM  Result Value Ref Range   WBC 10.3 4.0 - 10.5 K/uL   RBC 4.69 4.22 - 5.81 MIL/uL   Hemoglobin 14.6 13.0 - 17.0 g/dL   HCT 01.0 93.2 - 35.5 %   MCV 92.8 80.0 - 100.0 fL   MCH 31.1 26.0 - 34.0  pg   MCHC 33.6 30.0 - 36.0 g/dL   RDW 16.1 09.6 - 04.5 %   Platelets 359 150 - 400 K/uL   nRBC 0.0 0.0 - 0.2 %   Neutrophils Relative % 63 %   Neutro Abs 6.6 1.7 - 7.7 K/uL   Lymphocytes Relative 22 %   Lymphs Abs 2.3 0.7 - 4.0 K/uL   Monocytes Relative 11 %   Monocytes Absolute 1.2 (H) 0.1 - 1.0 K/uL   Eosinophils Relative 2 %   Eosinophils Absolute 0.2 0.0 - 0.5 K/uL   Basophils Relative 1 %   Basophils Absolute 0.1 0.0 - 0.1 K/uL   Immature Granulocytes 1 %   Abs Immature Granulocytes 0.06 0.00 - 0.07 K/uL    Comment: Performed at [facility], [address] [place], [place] [address]  APTT     Status: None   Collection Time: 10-31-2019  7:16 PM  Result Value Ref Range   aPTT 35 24 - 36 seconds    Comment: Performed at [facility], [address], [place], [place] [address]  Protime-INR     Status: None   Collection Time: 2019/10/31  7:16 PM  Result Value Ref Range   Prothrombin Time 14.4 11.4 - 15.2 seconds   INR 1.1 0.8 - 1.2    Comment: (NOTE) INR goal varies based on device and disease states. Performed at [facility], [address], [place], [place] [address]   Magnesium     Status: Abnormal   Collection Time: October 31, 2019  7:16 PM  Result Value Ref Range   Magnesium 1.5 (L) 1.7 - 2.4 mg/dL    Comment: Performed at [facility], [address] [place], [place] [address]  Blood Culture (routine x 2)     Status: None (Preliminary result)   Collection Time: 10/31/19  7:17 PM   Specimen: BLOOD  Result Value Ref Range   Specimen Description BLOOD LEFT ANTECUBITAL    Special Requests      BOTTLES DRAWN AEROBIC AND ANAEROBIC Blood Culture adequate volume   Culture      NO GROWTH < 12 HOURS Performed at [facility], [address],  [place], [place] [address]    Report Status PENDING   SARS CORONAVIRUS 2 (TAT 6-24 HRS)     Status: None   Collection Time: 10-31-19  8:14 PM  Result Value Ref Range   SARS Coronavirus 2 NEGATIVE NEGATIVE    Comment: (NOTE) SARS-CoV-2 target nucleic acids are NOT DETECTED. The SARS-CoV-2 RNA is generally detectable in upper and lower respiratory specimens during the acute phase of infection. Negative results do not preclude SARS-CoV-2 infection, do not rule out co-infections with other pathogens, and should not be used as the sole basis for treatment or other patient management decisions. Negative results must be combined with clinical observations, patient history, and epidemiological information. The expected result is Negative. Fact Sheet for Patients: HairSlick.no Fact Sheet for Healthcare Providers: quierodirigir.com This test is not yet approved or cleared by the [place] FDA and  has been authorized for detection and/or diagnosis of SARS-CoV-2 by FDA under an Emergency Use Authorization (EUA). This EUA will remain  in effect (meaning this test can be used) for the duration of the COVID-19 declaration under Section 56 4(b)(1) of the Act, 21 U.S.C. section 360bbb-3(b)(1), unless the authorization is terminated or revoked sooner. Performed at [facility], [address] [place], [place] [address]  CBG monitoring, ED     Status: Abnormal   Collection Time: 12-Nov-2019  8:17 PM  Result Value Ref Range   Glucose-Capillary 65 (L) 70 - 99 mg/dL    Comment: Glucose reference range applies only to samples taken after fasting for at least 8 hours.  Lactic acid, plasma     Status: Abnormal   Collection Time: 12-Nov-2019  9:12 PM  Result Value Ref Range   Lactic Acid, Venous 4.4 (HH) 0.5 - 1.9 mmol/L    Comment: CRITICAL VALUE NOTED.  VALUE IS CONSISTENT WITH PREVIOUSLY REPORTED AND CALLED VALUE. Performed at [facility], [address], [place], [place] [address]   CBG monitoring, ED     Status: Abnormal   Collection Time: Nov 12, 2019  9:32 PM  Result Value Ref Range   Glucose-Capillary 112 (H) 70 - 99 mg/dL    Comment: Glucose reference range applies only to samples taken after fasting for at least 8 hours.  Urinalysis, Routine w reflex microscopic     Status: None   Collection Time: Nov 12, 2019  9:33 PM  Result Value Ref Range   Color, Urine YELLOW YELLOW   APPearance CLEAR CLEAR   Specific Gravity, Urine 1.006 1.005 - 1.030   pH 6.0 5.0 - 8.0   Glucose, UA NEGATIVE NEGATIVE mg/dL   Hgb urine dipstick NEGATIVE NEGATIVE   Bilirubin Urine NEGATIVE NEGATIVE   Ketones, ur NEGATIVE NEGATIVE mg/dL   Protein, ur NEGATIVE NEGATIVE mg/dL   Nitrite NEGATIVE NEGATIVE   Leukocytes,Ua NEGATIVE NEGATIVE    Comment: Performed at [facility], [address], [place], [place] [address]  Troponin I (High Sensitivity)     Status: Abnormal   Collection Time: 12-Nov-2019 10:35 PM  Result Value Ref Range   Troponin I (High Sensitivity) 20 (H) <18 ng/L    Comment: (NOTE) Elevated high sensitivity troponin I (hsTnI) values and significant  changes across serial measurements may suggest ACS but many other  chronic and acute conditions are known to elevate hsTnI results.  Refer to the "Links" section for chest pain algorithms and additional  guidance. Performed at [facility], [address] [place], [place] [address]   Brain natriuretic peptide     Status: Abnormal   Collection Time: 12-Nov-2019 10:35 PM  Result Value Ref Range   B Natriuretic Peptide 403.0 (H) 0.0 - 100.0 pg/mL    Comment: Performed at [facility] Lab, [address] [place], [place] [address]  Troponin I (High Sensitivity)     Status: Abnormal   Collection Time: 10/21/19 12:18 AM  Result Value Ref Range   Troponin I (High Sensitivity) 18 (H) <18 ng/L    Comment: (NOTE) Elevated high sensitivity troponin I (hsTnI) values and  significant  changes across serial measurements may suggest ACS but many other  chronic and acute conditions are known to elevate hsTnI results.  Refer to the "Links" section for chest pain algorithms and additional  guidance. Performed at [facility], [address], [place], [place] [address]   Lactic acid, plasma     Status: Abnormal   Collection Time: 10/21/19  1:25 AM  Result Value Ref Range   Lactic Acid, Venous 2.6 (HH) 0.5 - 1.9 mmol/L    Comment: CRITICAL VALUE NOTED.  VALUE IS CONSISTENT WITH PREVIOUSLY REPORTED AND CALLED VALUE. Performed at [facility], [address] [place], [place] [address]   Sodium, urine, random     Status: None  Collection Time: 10/21/19  2:46 AM  Result Value Ref Range   Sodium, Ur <10 mmol/L    Comment: REPEATED TO VERIFY Performed at [facility], [address] [place], [place] [address]   Basic metabolic panel     Status: Abnormal   Collection Time: 10/21/19  3:01 AM  Result Value Ref Range   Sodium 129 (L) 135 - 145 mmol/L   Potassium 3.5 3.5 - 5.1 mmol/L   Chloride 85 (L) 98 - 111 mmol/L   CO2 27 22 - 32 mmol/L   Glucose, Bld 89 70 - 99 mg/dL    Comment: Glucose reference range applies only to samples taken after fasting for at least 8 hours.   BUN 33 (H) 8 - 23 mg/dL   Creatinine, Ser 6.040.55 (L) 0.61 - 1.24 mg/dL   Calcium 8.9 8.9 - 54.010.3 mg/dL   GFR calc non Af Amer >60 >60 mL/min   GFR calc Af Amer >60 >60 mL/min   Anion gap 17 (H) 5 - 15    Comment: Performed at [facility], [address] [place], [place] [address]  Lactic acid, plasma     Status: Abnormal   Collection Time: 10/21/19  4:17 AM  Result Value Ref Range   Lactic Acid, Venous 2.5 (HH) 0.5 - 1.9 mmol/L    Comment: CRITICAL VALUE NOTED.  VALUE IS CONSISTENT WITH PREVIOUSLY REPORTED AND CALLED VALUE. Performed at [facility], [address] [place], [place] [address]   I-STAT 7, (LYTES, BLD GAS, ICA, H+H)     Status: Abnormal    Collection Time: 10/21/19  5:19 AM  Result Value Ref Range   pH, Arterial 7.505 (H) 7.350 - 7.450   pCO2 arterial 38.7 32.0 - 48.0 mmHg   pO2, Arterial 77.0 (L) 83.0 - 108.0 mmHg   Bicarbonate 30.5 (H) 20.0 - 28.0 mmol/L   TCO2 32 22 - 32 mmol/L   O2 Saturation 96.0 %   Acid-Base Excess 7.0 (H) 0.0 - 2.0 mmol/L   Sodium 129 (L) 135 - 145 mmol/L   Potassium 2.6 (LL) 3.5 - 5.1 mmol/L   Calcium, Ion 1.14 (L) 1.15 - 1.40 mmol/L   HCT 39.0 39.0 - 52.0 %   Hemoglobin 13.3 13.0 - 17.0 g/dL   Patient temperature 95.698.8 F    Collection site RADIAL, ALLEN'S TEST ACCEPTABLE    Drawn by RT    Sample type ARTERIAL    Comment NOTIFIED PHYSICIAN    CT ABDOMEN PELVIS WO CONTRAST  Result Date: 10/11/2019 CLINICAL DATA:  Nausea vomiting EXAM: CT ABDOMEN AND PELVIS WITHOUT CONTRAST TECHNIQUE: Multidetector CT imaging of the abdomen and pelvis was performed following the standard protocol without IV contrast. COMPARISON:  Chest x-ray 10/11/2019 FINDINGS: Lower chest: Lung bases demonstrate consolidation within the right lower lobe. Normal heart size. Coronary vascular calcification Hepatobiliary: No focal hepatic abnormality. Slightly distended gallbladder with layering stones or sludge. No biliary dilatation Pancreas: Atrophic.  No inflammatory change Spleen: Normal in size without focal abnormality. Adrenals/Urinary Tract: Adrenal glands are normal. Negative for hydronephrosis. The bladder is unremarkable Stomach/Bowel: Esophageal tube tip at the proximal duodenum. Gastrostomy tube in the distal stomach. Mildly dilated fluid-filled small bowel with gradual transition to decompressed distal small bowel and terminal ileum in the right lower quadrant of the abdomen, series 3, image number 44. No bowel wall thickening. Moderate stool in the colon. Negative appendix. Vascular/Lymphatic: Extensive aortic atherosclerosis. No aneurysm. No significant adenopathy Reproductive: Prostate is unremarkable.  Other: Negative for  free air or free fluid. Musculoskeletal: Fixating screw at L5. Chronic appearing deformity at L3 with posterior wedging of the vertebral body. Advanced degenerative changes throughout the lumbar spine. Ghost tracks within the pedicles and vertebra from L1 to S1 consistent with prior surgical hardware and posterior decompression. Probable scar tissue within the posterior spinal region. Suspected 2 by 3.9 cm fluid collection within the posterior paraspinal soft tissues. IMPRESSION: 1. Consolidation in the right lower lobe which may be secondary to atelectasis, pneumonia, or aspiration. Central obstructing process is also possible as this is incompletely visualized. Consider dedicated chest CT for further evaluation. 2. Mildly dilated fluid-filled loops of proximal and mid small bowel with transition to decompressed distal and terminal ileum suggestive of low-grade bowel obstruction, less likely ileus. 3. Evidence of prior lumbar spine surgery and hardware removal. Soft tissue thickening within the posterior paraspinal soft tissues presumably due to postsurgical change; there is a 2 x 3.9 cm nonspecific fluid collection centrally within the soft tissue changes 4. Gallstones and or sludge. Electronically Signed   By: Jasmine Pang M.D.   On: 2019/10/21 20:31   CT Chest Wo Contrast  Result Date: 10-21-2019 CLINICAL DATA:  Shortness of breath.  Respiratory failure. EXAM: CT CHEST WITHOUT CONTRAST TECHNIQUE: Multidetector CT imaging of the chest was performed following the standard protocol without IV contrast. COMPARISON:  None. FINDINGS: Cardiovascular: The heart size is normal. Aortic calcifications are noted without evidence for thoracic aortic aneurysm. Advanced coronary artery calcifications are noted. There is no significant pericardial effusion. Mediastinum/Nodes: --No mediastinal or hilar lymphadenopathy. --No axillary lymphadenopathy. --No supraclavicular lymphadenopathy. --Normal thyroid gland. --there is an  enteric tube that extends through the patient's esophagus and terminates in the region of the gastric pylorus. Lungs/Pleura: There is a tracheostomy tube in place that terminates above the carina. There is no pneumothorax. There is some atelectasis at the left lung base. There is essentially complete collapse of the right lower lobe which appears to be secondary to extensive endobronchial debris and plugging of the right lower lobe bronchus. There is some attenuation of the right middle lobe bronchus with persistent adequate aeration of the the right middle lobe. There is mild consolidation in the posterior segments of the right upper lobe, likely representing aspiration. Upper Abdomen: There is cholelithiasis with questionable gallbladder wall thickening. A gastrostomy tube is noted. Musculoskeletal: There is an old healed proximal right humerus fracture. There are advanced degenerative changes of the left glenohumeral joint. Review of the MIP images confirms the above findings. IMPRESSION: 1. Complete collapse of the right lower lobe secondary to aspirated debris within the right lower lobe bronchus. 2. Consolidation in the posterior segments of the right upper lobe concerning for aspiration. 3. Cholelithiasis with questionable gallbladder wall thickening. Correlation with ultrasound is recommended. 4.  Aortic Atherosclerosis (ICD10-I70.0). Electronically Signed   By: Katherine Mantle M.D.   On: October 21, 2019 22:45   DG CHEST PORT 1 VIEW  Result Date: 10/21/2019 CLINICAL DATA:  Status post bronchoscopy. EXAM: PORTABLE CHEST 1 VIEW COMPARISON:  CT chest 10-21-2019, chest radiograph 10/21/2019 FINDINGS: Unchanged position of a tracheostomy tube. An enteric tube passes below the level of the left hemidiaphragm with tip excluded from the field of view. Overlying cardiac monitoring leads. Cardiomediastinal silhouette unchanged. Persistent opacity within the right mid to lower lung field which may reflect a  combination of previously demonstrated right lower lobe collapse and right upper lobe consolidation. The left lung is clear. No evidence of pneumothorax. IMPRESSION:  Persistent opacity throughout the right mid to lower lung field which may reflect combination of previously demonstrated right lower lobe collapse and right upper lobe consolidation. Support apparatus as described. Electronically Signed   By: Kellie Simmering DO   On: 10/21/2019 07:10   DG Chest Port 1 View  Result Date: 11/04/19 CLINICAL DATA:  Hypotension EXAM: PORTABLE CHEST 1 VIEW COMPARISON:  None. FINDINGS: The tracheostomy tube terminates above the carina. The tip of the enteric tube is not fully visualized on this study. The patient is rotated which significantly limits evaluation. There is suggestion of a right perihilar and right lower lung zone airspace opacity. There is no pneumothorax. There is mild vascular congestion. The cardiac silhouette appears to be at least mildly enlarged. Advanced degenerative changes are noted of the glenohumeral joints. IMPRESSION: 1. Limited study secondary to patient positioning. 2. Lines and tubes as above. The tip of the enteric tube is not reliably identified on this study. 3. Possible right lower lobe and right perihilar airspace opacities. These would be better evaluated with a repeat radiograph with improved patient positioning. Hypotension Electronically Signed   By: Constance Holster M.D.   On: November 04, 2019 19:20    Anti-infectives (From admission, onward)   Start     Dose/Rate Route Frequency Ordered Stop   10/21/19 0800  vancomycin (VANCOREADY) IVPB 1250 mg/250 mL     1,250 mg 166.7 mL/hr over 90 Minutes Intravenous Every 12 hours Nov 04, 2019 2033     10/21/19 0430  ceFEPIme (MAXIPIME) 2 g in sodium chloride 0.9 % 100 mL IVPB     2 g 200 mL/hr over 30 Minutes Intravenous Every 8 hours 11-04-2019 2033     04-Nov-2019 2030  vancomycin (VANCOCIN) IVPB 1000 mg/200 mL premix     1,000 mg 200 mL/hr  over 60 Minutes Intravenous  Once 11/04/2019 1921 11/04/2019 2130   Nov 04, 2019 1845  ceFEPIme (MAXIPIME) 2 g in sodium chloride 0.9 % 100 mL IVPB     2 g 200 mL/hr over 30 Minutes Intravenous  Once 04-Nov-2019 1842 2019/11/04 2052   11/04/2019 1845  metroNIDAZOLE (FLAGYL) IVPB 500 mg     500 mg 100 mL/hr over 60 Minutes Intravenous  Once November 04, 2019 1842 11-04-19 2018   November 04, 2019 1845  vancomycin (VANCOCIN) IVPB 1000 mg/200 mL premix     1,000 mg 200 mL/hr over 60 Minutes Intravenous  Once 11/04/19 1842 2019/11/04 2031       Assessment/Plan Hyponatremia Hypokalemia A fib Aspiration PNA w/ right mainstem occlusion s/p Bronch VDRF Gallstones - No RUQ tenderness  SBO - CT w/ mildly dilated fluid-filled loops of proximal and mid small bowel with transition to decompressed distal and terminal ileum suggestive of low-grade bowel obstruction - PEG and NGT appear in good position on CT - Will start SBO protocol. Spoke with RN about this. 2 hours after gastrografin administration, place NGT back to LIWS and PEG tube to gravity.  - Keep K > 4 and Mg > 2 for bowel function - We will follow along   FEN - NPO, IVF VTE - SCDs, Per IM/CCM ID -Per IM/CCM  Jillyn Ledger, [facility] Surgery 10/21/2019, 8:19 AM Please see Amion for pager number during day hours 7:00am-4:30pm

## 2019-10-21 NOTE — Procedures (Signed)
Bronchoscopy Procedure Note Renji Berwick [id number] 03/04/1956  Procedure: Bronchoscopy Indications: Diagnostic evaluation of the airways, Obtain specimens for culture and/or other diagnostic studies, Remove secretions and right main bronchus occlusion  Procedure Details Consent: Risks of procedure as well as the alternatives and risks of each were explained to the (patient/caregiver).  Consent for procedure obtained. Time Out: Verified patient identification, verified procedure, site/side was marked, verified correct patient position, special equipment/implants available, medications/allergies/relevent history reviewed, required imaging and test results available.  Performed  In preparation for procedure, patient was given 100% FiO2, bronchoscope lubricated and 60 mg lidocaine given via trach. Sedation: fentanyl and versed  Airway entered and the following bronchi were examined: RUL, RML, RLL, LUL, LLL and Bronchi. There were chronic bronchitic changes noted primarily over lower lobe mucosa.  Procedures performed: BAL in RML with 30 cc saline instilled and 15cc cloudy secretions returned, therapeutic aspiration of thick, purulent secretions and 9m x 559mchipped tooth suctioned from RLL   Bronchoscope removed, Patient placed back on 100% FiO2 at conclusion of procedure.    Evaluation Hemodynamic Status: BP stable throughout; O2 sats: stable throughout Patient's Current Condition: stable Specimens:  Sent  Complications: No apparent complications Patient did tolerate procedure well.   NaMaryjane Hurter/20/2021

## 2019-10-21 NOTE — ED Notes (Signed)
NG clamped AT 1000

## 2019-10-22 ENCOUNTER — Inpatient Hospital Stay (HOSPITAL_COMMUNITY): Payer: Medicare Other

## 2019-10-22 DIAGNOSIS — Z9889 Other specified postprocedural states: Secondary | ICD-10-CM

## 2019-10-22 LAB — COMPREHENSIVE METABOLIC PANEL
ALT: 26 U/L (ref 0–44)
AST: 20 U/L (ref 15–41)
Albumin: 2.5 g/dL — ABNORMAL LOW (ref 3.5–5.0)
Alkaline Phosphatase: 107 U/L (ref 38–126)
Anion gap: 12 (ref 5–15)
BUN: 19 mg/dL (ref 8–23)
CO2: 27 mmol/L (ref 22–32)
Calcium: 8.5 mg/dL — ABNORMAL LOW (ref 8.9–10.3)
Chloride: 95 mmol/L — ABNORMAL LOW (ref 98–111)
Creatinine, Ser: 0.32 mg/dL — ABNORMAL LOW (ref 0.61–1.24)
GFR calc Af Amer: >60 mL/min (ref >60)
GFR calc non Af Amer: >60 mL/min (ref >60)
Glucose, Bld: 97 mg/dL (ref 70–99)
Potassium: 3.4 mmol/L — ABNORMAL LOW (ref 3.5–5.1)
Sodium: 134 mmol/L — ABNORMAL LOW (ref 135–145)
Total Bilirubin: 1.7 mg/dL — ABNORMAL HIGH (ref 0.3–1.2)
Total Protein: 6.5 g/dL (ref 6.5–8.1)

## 2019-10-22 LAB — BASIC METABOLIC PANEL
Anion gap: 10 (ref 5–15)
BUN: 13 mg/dL (ref 8–23)
CO2: 27 mmol/L (ref 22–32)
Calcium: 8.5 mg/dL — ABNORMAL LOW (ref 8.9–10.3)
Chloride: 95 mmol/L — ABNORMAL LOW (ref 98–111)
Creatinine, Ser: 0.33 mg/dL — ABNORMAL LOW (ref 0.61–1.24)
GFR calc Af Amer: >60 mL/min (ref >60)
GFR calc non Af Amer: >60 mL/min (ref >60)
Glucose, Bld: 102 mg/dL — ABNORMAL HIGH (ref 70–99)
Potassium: 3.2 mmol/L — ABNORMAL LOW (ref 3.5–5.1)
Sodium: 132 mmol/L — ABNORMAL LOW (ref 135–145)

## 2019-10-22 LAB — CBC WITH DIFFERENTIAL/PLATELET
Abs Immature Granulocytes: 0.1 10*3/uL — ABNORMAL HIGH (ref 0.00–0.07)
Basophils Absolute: 0.1 10*3/uL (ref 0.0–0.1)
Basophils Relative: 1 %
Eosinophils Absolute: 0.3 10*3/uL (ref 0.0–0.5)
Eosinophils Relative: 2 %
HCT: 39.7 % (ref 39.0–52.0)
Hemoglobin: 13.4 g/dL (ref 13.0–17.0)
Immature Granulocytes: 1 %
Lymphocytes Relative: 13 %
Lymphs Abs: 1.8 10*3/uL (ref 0.7–4.0)
MCH: 31.5 pg (ref 26.0–34.0)
MCHC: 33.8 g/dL (ref 30.0–36.0)
MCV: 93.2 fL (ref 80.0–100.0)
Monocytes Absolute: 1.2 10*3/uL — ABNORMAL HIGH (ref 0.1–1.0)
Monocytes Relative: 9 %
Neutro Abs: 10.1 10*3/uL — ABNORMAL HIGH (ref 1.7–7.7)
Neutrophils Relative %: 74 %
Platelets: 266 10*3/uL (ref 150–400)
RBC: 4.26 MIL/uL (ref 4.22–5.81)
RDW: 12.8 % (ref 11.5–15.5)
WBC: 13.6 10*3/uL — ABNORMAL HIGH (ref 4.0–10.5)
nRBC: 0 % (ref 0.0–0.2)

## 2019-10-22 LAB — GLUCOSE, CAPILLARY
Glucose-Capillary: 88 mg/dL (ref 70–99)
Glucose-Capillary: 90 mg/dL (ref 70–99)

## 2019-10-22 LAB — MAGNESIUM: Magnesium: 1.8 mg/dL (ref 1.7–2.4)

## 2019-10-22 MED ORDER — SODIUM CHLORIDE 0.9 % IV BOLUS
500.0000 mL | Freq: Once | INTRAVENOUS | Status: AC
  Administered 2019-10-22: 500 mL via INTRAVENOUS

## 2019-10-22 MED ORDER — DEXTROSE-NACL 5-0.9 % IV SOLN: INTRAVENOUS | Status: DC

## 2019-10-22 MED ORDER — POTASSIUM CHLORIDE 10 MEQ/50ML IV SOLN
INTRAVENOUS | Status: AC
  Administered 2019-10-22: 10 meq via INTRAVENOUS
  Filled 2019-10-22: qty 50

## 2019-10-22 MED ORDER — POTASSIUM CHLORIDE 10 MEQ/50ML IV SOLN
10.0000 meq | INTRAVENOUS | Status: AC
  Administered 2019-10-22: 10 meq via INTRAVENOUS
  Filled 2019-10-22 (×2): qty 50

## 2019-10-22 MED ORDER — ORAL CARE MOUTH RINSE
15.0000 mL | OROMUCOSAL | Status: DC
  Administered 2019-10-22 – 2019-11-19 (×174): 15 mL via OROMUCOSAL

## 2019-10-22 MED ORDER — CHLORHEXIDINE GLUCONATE 0.12% ORAL RINSE (MEDLINE KIT)
15.0000 mL | Freq: Two times a day (BID) | OROMUCOSAL | Status: DC
  Administered 2019-10-22 – 2019-11-18 (×49): 15 mL via OROMUCOSAL

## 2019-10-22 MED ORDER — MAGNESIUM SULFATE 2 GM/50ML IV SOLN
2.0000 g | Freq: Once | INTRAVENOUS | Status: AC
  Administered 2019-10-22: 2 g via INTRAVENOUS
  Filled 2019-10-22: qty 50

## 2019-10-22 NOTE — Progress Notes (Signed)
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  This improved with IV fluids.  Imaging right lower lobe consolidation.  Admit to progressive care with PCCM consult for vent management  History of present illness   Brady [name] is a 64 year old male who had Pseudomonas/Serratia pneumonia at [facility] in February 2021 and required tracheostomy.  He was transferred to [facility] and remains vent dependent.  Patient apparently developed an ileus over the last several days and then atrial fibrillation today.  He was given metoprolol (unclear dose) and developed hypotension.  He was given 1 L IV fluids, started on Levophed and transferred to the emergency department.   In the emergency department, he has not required any further vasopressors.  CT chest shows complete collapse of the right lower lobe with right upper lobe consolidation concerning for aspiration.  He was covered with with broad-spectrum antibiotics with vancomycin, cefepime and Flagyl.  CT abdomen pelvis shows ileus.  His overall status was stable, therefore internal medicine consulted for admission and PCCM consulted for vent management.  Covid-19 results are pending  Past Medical History  Spinal stenosis, hepatitis C, hypertension, chronic respiratory failure trach and vent dependent  Significant Hospital Events   3/19-admit to progressive care  Consults:  PCCM  Procedures:    Significant Diagnostic Tests:  3/19 CT abdomen pelvis>>Mildly dilated fluid-filled loops of proximal and mid small bowel with transition to decompressed distal and terminal ileum suggestive  of low-grade bowel obstruction 3/19 CT chest>>Complete collapse of the RLL secondary to aspirated debris within the RLL bronchus. Consolidation in the posterior segments of the RUL concerning for aspiration.Cholelithiasis with questionable gallbladder wall thickening.   Micro Data:  3/19 SARS-Covid-2>> pending 3/19 urine culture>>  3/19 blood cultures x2>>  Antimicrobials:  Cefepime 3/19- Flagyl 3/19- Vancomycin 3/19-  Interim history/subjective:  Patient's blood pressure improved, awake and alert, remains in atrial fibrillation without respiratory distress  Objective   Blood pressure 106/62, pulse 84, temperature 98.9 F (37.2 C), temperature source Oral, resp. rate (!) 28, height _0  (1.727 m), weight 130.6 kg, SpO2 97 %.    Vent Mode: PRVC FiO2 (%):  [40 %-50 %] 40 % Set Rate:  [12 bmp-28 bmp] 28 bmp Vt Set:  [500 mL] 500 mL PEEP:  [5 cmH20] 5 cmH20 Plateau Pressure:  [15 cmH20-19 cmH20] 15 cmH20   Intake/Output Summary (Last 24 hours) at 10/22/2019 1226 Last data filed at 10/22/2019 1000 Gross per 24 hour  Intake 3568.67 ml  Output 1236 ml  Net 2332.67 ml   Filed Weights   10/06/2019 2125 10/22/19 0500  Weight: 103 kg 130.6 kg   General: Chronically ill-appearing male in no acute distress HEENT: MM pink/moist, trach in place Neuro: Awake and responsive CV: s1s2 regular rate and tachycardic, no m/r/g PULM: Decreased air movement right upper and lower lobes GI: soft, bsx4 active  Extremities: warm/dry, no edema  Skin: no rashes or lesions   Resolved Hospital Problem list   Hypotension  Assessment & Plan:    Right lower lobe collapse, chronic trach and vent dependent -Likely mucous plugging and aspiration event, history of Serratia and Pseudomonas pneumonia -ABG 7.5/36.4/59/29.1 on baseline vent setting and  35% FiO2 P: -Agree with broad antibiotic coverage with Vanco and cefepime and blood cultures -follow up bronch with BAL cx;  -Pulmonary toilet; will  transition to chest PT, BID lavage -FiO2 at 50% and sat acceptable -titrate Vent setting to maintain SpO2 greater than or equal to 90%. -HOB elevated 30 degrees. -Plateau pressures less than 30 cm H20.  -Follow chest x-ray, ABG prn. CXR today (3/21) pending.  -Bronchial hygiene and RT/bronchodilator protocol.  Atrial fibrillation -Appears to be new onset, possibly secondary to developing sepsis in the setting of aspiration pneumonia -Unclear how much metoprolol patient was given prior to hypotension before arrival P: -Management per primary team, consider echo, TSH, cards consult -CHA2DS-VASc2 score is 1  -suggest low dose Metoprolol 6.25 mg twice daily or cardizem gtt if RVR uncontrolled and BP will tolerate   Ileus -Recommend NG tube to gravity -N.p.o. -Bowel regimen  Best practice:  Diet: N.p.o. Pain/Anxiety/Delirium protocol (if indicated): N/A VAP protocol (if indicated): Head of bed 30 degrees DVT prophylaxis: Lovenox GI prophylaxis: N/A Glucose control: N/A Mobility: Bedrest Code Status: Full code Family Communication: Per primary Disposition: Progressive care  Labs   CBC: Recent Labs  Lab 10/19/2019 1902 10/31/2019 1916 10/21/19 0519 10/22/19 0048  WBC  --  10.3  --  13.6*  NEUTROABS  --  6.6  --  10.1*  HGB 14.3 14.6 13.3 13.4  HCT 42.0 43.5 39.0 39.7  MCV  --  92.8  --  93.2  PLT  --  359  --  440    Basic Metabolic Panel: Recent Labs  Lab 10/15/2019 1916 10/04/2019 1916 10/21/19 0301 10/21/19 0519 10/21/19 0956 10/21/19 1614 10/22/19 0048  NA 125*   < > 129* 129* 133* 134* 134*  K 3.2*   < > 3.5 2.6* 3.2* 3.1* 3.4*  CL 80*  --  85*  --  90* 89* 95*  CO2 29  --  27  --  32 30 27  GLUCOSE 53*  --  89  --  88 92 97  BUN 48*  --  33*  --  25* 24* 19  CREATININE 0.80  --  0.55*  --  0.46* 0.38* 0.32*  CALCIUM 8.9  --  8.9  --  8.5* 8.7* 8.5*  MG 1.5*  --   --   --   --  1.7 1.8   < > = values in this interval not displayed.   GFR: Estimated  Creatinine Clearance: 123.1 mL/min (A) (by C-G formula based on SCr of 0.32 mg/dL (L)). Recent Labs  Lab 10/27/2019 1916 10/21/2019 2112 10/21/19 0125 10/21/19 0417 10/22/19 0048  WBC 10.3  --   --   --  13.6*  LATICACIDVEN 4.2* 4.4* 2.6* 2.5*  --     Liver Function Tests: Recent Labs  Lab 10/04/2019 1916 10/22/19 0048  AST 27 20  ALT 29 26  ALKPHOS 126 107  BILITOT 1.6* 1.7*  PROT 7.7 6.5  ALBUMIN 2.9* 2.5*   No results for input(s): LIPASE, AMYLASE in the last 168 hours. No results for input(s): AMMONIA in the last 168 hours.  ABG    Component Value Date/Time   PHART 7.505 (H) 10/21/2019 0519   PCO2ART 38.7 10/21/2019 0519   PO2ART 77.0 (L) 10/21/2019 0519   HCO3 30.5 (H) 10/21/2019 0519   TCO2 32 10/21/2019 0519   O2SAT 96.0 10/21/2019 0519     Coagulation Profile: Recent Labs  Lab 10/29/2019 1916  INR 1.1    Cardiac  Enzymes: No results for input(s): CKTOTAL, CKMB, CKMBINDEX, TROPONINI in the last 168 hours.  HbA1C: No results found for: HGBA1C  CBG: Recent Labs  Lab 10/05/2019 2017 10/25/2019 2132 10/21/19 1945 10/21/19 2313 10/22/19 0302  GLUCAP 65* 112* 81 78 88    Review of Systems:   Unable to obtain secondary to no [employer]  Past Medical History  He,  has no past medical history on file.   Surgical History     Social History      Family History   His family history is not on file.   Allergies Not on File   Home Medications  Prior to Admission medications   Not on File     CRITICAL CARE Performed by: Bonna Gains   Total critical care time: 32 minutes  Critical care time was exclusive of separately billable procedures and treating other patients.  Critical care was necessary to treat or prevent imminent or life-threatening deterioration.  Critical care was time spent personally by me on the following activities: development of treatment plan with patient and/or surrogate as well as nursing, discussions with  consultants, evaluation of patient's response to treatment, examination of patient, obtaining history from patient or surrogate, ordering and performing treatments and interventions, ordering and review of laboratory studies, ordering and review of radiographic studies, pulse oximetry and re-evaluation of patient's condition.  Bonna Gains, MD  10/22/19 12:34 PM

## 2019-10-22 NOTE — Progress Notes (Signed)
eLink Physician-Brief Progress Note Patient Name: Saket Hellstrom DOB: 06-13-1956 MRN: [id number]   Date of Service  10/22/2019  HPI/Events of Note  K+ 3.4, Mg 1.8  eICU Interventions  Adult electrolyte replacement protocol ordered for K+ and Mg.        Thomasene Lot Avyana Puffenbarger 10/22/2019, 4:01 AM

## 2019-10-22 NOTE — Progress Notes (Signed)
X-ray from today reviewed.  Increased aeration of the right upper lobes.  Still persistent atelectasis, or collapse of the right lower lobes.  Bronch Cultures pending.  Coverage for presumptive Serratia in place from pneumonia in Jan, urine micro now.  Instituting more aggressive chest PT, etc.   Gwynne Edinger MD, PhD. 4:47 PM   .

## 2019-10-22 NOTE — Progress Notes (Signed)
   Subjective:  Pt seen at the bedside this morning. Awake and alert. Denies abdominal pain. Endorsing bowel movements.   Objective:  Vital signs in last 24 hours: Vitals:   10/22/19 0400 10/22/19 0500 10/22/19 0600 10/22/19 0700  BP: 114/70 (!) 91/57 (!) 79/54 (!) 81/51  Pulse: 89 76 68 (!) 55  Resp: 18 (!) 22 15 14   Temp: 98.6 F (37 C)     TempSrc: Oral     SpO2: 94% 91% 97% 95%  Weight:  130.6 kg    Height:       Physical Exam Vitals and nursing note reviewed.  Constitutional:      Comments: Chronically ill appearing, trach in place.  Abdominal:     Comments: + bowel sounds, non tender, non distended, dull to percussion  Neurological:     Mental Status: He is alert. Mental status is at baseline.     Comments: Mouthing words over his trach    Assessment/Plan:  Active Problems:   Hypotension   Lactic acidosis   Small bowel obstruction (HCC)   VAP (ventilator-associated pneumonia) (HCC)   Acute on chronic respiratory failure (HCC)  64yo male w PMH vent dependent respiratory failure, HLD, atypical depression, COPD, chronic low back pain, and new PEG tube placement 10/05/2019 presenting from [facility] with SBO and new onset atrial fibrillation.    Small Bowel Obstruction Hypokalemia  CT findings suggestive of low grade bowel obstruction.  - abdominal x-ray today with contrast now in colon, persistent mildly dilated loops of small bowel seen in L abdomen (possible partial bowel obstruction) General surgery consulted - pt having BM and contrast in colon - remove NG today - clamp PEG, possible trickle tube feeds tomorrow - goal K > 4 and Mg > 2  Aspiration Pneumonia Vent Dependent Respiratory Failure   Underwent bronchoscopy and lavage on 3/20 with improvement in respiratory status. - respiratory culture with rare serratia marcescens - will d/c vanc and continue cefepime - continue to follow up cell count, AFB, and fungus culture - O2 100% on 40% FiO2 - CXR today  with persistent RLL collapse, slight improved aeration of RUL, and clear left lung - PCCM following  New Onset Atrial Fibrillation with RVR Likely secondary to aspiration pneumonia and SBO. HR controlled in 80s today. Rate controlled after 5mg  IV metop. BP remains soft with SBP 90s. - CHA2DS- VACs2 of 1  Hypovolemic Hyponatremia - improving Symptoms and urine studies consistent with hypovolemic hyponatremia. Na 125 on admission. Na 125 > 129 > 133 > 134 - Na 134 this AM - 500cc NS bolus  Prior to Admission Living Arrangement: [facility] Anticipated Discharge Location: ?[facility] Barriers to Discharge: pt expressing wishes to not return to his prior facility, will consult TOC for placement Dispo: Anticipated discharge pending clinical course  4/20, MD 10/22/2019, 7:18 AM Pager: [phone]

## 2019-10-22 NOTE — Progress Notes (Signed)
Spoke with pts wife [place]. Updated on pt status. All questions answered.

## 2019-10-22 NOTE — Progress Notes (Signed)
Patient transported from 2H16 to 2H02 without complications.

## 2019-10-22 NOTE — Plan of Care (Signed)
  Problem: Education: Goal: Knowledge of General Education information will improve Description: Including pain rating scale, medication(s)/side effects and non-pharmacologic comfort measures Outcome: Progressing   Problem: Clinical Measurements: Goal: Ability to maintain clinical measurements within normal limits will improve Outcome: Progressing Goal: Will remain free from infection Outcome: Progressing Goal: Diagnostic test results will improve Outcome: Progressing Goal: Respiratory complications will improve Outcome: Progressing Goal: Cardiovascular complication will be avoided Outcome: Progressing   Problem: Coping: Goal: Level of anxiety will decrease Outcome: Progressing   Problem: Elimination: Goal: Will not experience complications related to bowel motility Outcome: Progressing Goal: Will not experience complications related to urinary retention Outcome: Progressing   Problem: Pain Managment: Goal: General experience of comfort will improve Outcome: Progressing   Problem: Safety: Goal: Ability to remain free from injury will improve Outcome: Progressing   Problem: Skin Integrity: Goal: Risk for impaired skin integrity will decrease Outcome: Progressing   

## 2019-10-22 NOTE — Progress Notes (Signed)
Subjective: CC: Doing well. He reports no abdominal pain or nausea. He notes 3 BM's in the last 24 hours. Contrast noted in colon on xray this am.   Objective: Vital signs in last 24 hours: Temp:  [97 F (36.1 C)-98.6 F (37 C)] 97 F (36.1 C) (03/21 0730) Pulse Rate:  [29-96] 76 (03/21 0900) Resp:  [14-30] 20 (03/21 0900) BP: (78-119)/(34-98) 97/60 (03/21 0900) SpO2:  [89 %-100 %] 97 % (03/21 0900) FiO2 (%):  [40 %-50 %] 40 % (03/21 0834) Weight:  [130.6 kg] 130.6 kg (03/21 0500) Last BM Date: 10/21/19  Intake/Output from previous day: 03/20 0701 - 03/21 0700 In: 2767.5 [I.V.:1163.7; IV Piggyback:1603.8] Out: 811 [Urine:410; Stool:401] Intake/Output this shift: Total I/O In: 569.1 [I.V.:114.2; IV Piggyback:454.9] Out: -   PE: Gen: Awake and alert, on vent HEENT: NGT in place with minimal bilious output  Abd: Soft, ND, NT, +BS, PEG tube in place   Lab Results:  Recent Labs    10/22/2019 1916 10/15/2019 1916 10/21/19 0519 10/22/19 0048  WBC 10.3  --   --  13.6*  HGB 14.6   < > 13.3 13.4  HCT 43.5   < > 39.0 39.7  PLT 359  --   --  266   < > = values in this interval not displayed.   BMET Recent Labs    10/21/19 1614 10/22/19 0048  NA 134* 134*  K 3.1* 3.4*  CL 89* 95*  CO2 30 27  GLUCOSE 92 97  BUN 24* 19  CREATININE 0.38* 0.32*  CALCIUM 8.7* 8.5*   PT/INR Recent Labs    10/09/2019 1916  LABPROT 14.4  INR 1.1   CMP     Component Value Date/Time   NA 134 (L) 10/22/2019 0048   K 3.4 (L) 10/22/2019 0048   CL 95 (L) 10/22/2019 0048   CO2 27 10/22/2019 0048   GLUCOSE 97 10/22/2019 0048   BUN 19 10/22/2019 0048   CREATININE 0.32 (L) 10/22/2019 0048   CALCIUM 8.5 (L) 10/22/2019 0048   PROT 6.5 10/22/2019 0048   ALBUMIN 2.5 (L) 10/22/2019 0048   AST 20 10/22/2019 0048   ALT 26 10/22/2019 0048   ALKPHOS 107 10/22/2019 0048   BILITOT 1.7 (H) 10/22/2019 0048   GFRNONAA >60 10/22/2019 0048   GFRAA >60 10/22/2019 0048   Lipase  No results  found for: LIPASE     Studies/Results: CT ABDOMEN PELVIS WO CONTRAST  Result Date: 10/08/2019 CLINICAL DATA:  Nausea vomiting EXAM: CT ABDOMEN AND PELVIS WITHOUT CONTRAST TECHNIQUE: Multidetector CT imaging of the abdomen and pelvis was performed following the standard protocol without IV contrast. COMPARISON:  Chest x-ray 10/29/2019 FINDINGS: Lower chest: Lung bases demonstrate consolidation within the right lower lobe. Normal heart size. Coronary vascular calcification Hepatobiliary: No focal hepatic abnormality. Slightly distended gallbladder with layering stones or sludge. No biliary dilatation Pancreas: Atrophic.  No inflammatory change Spleen: Normal in size without focal abnormality. Adrenals/Urinary Tract: Adrenal glands are normal. Negative for hydronephrosis. The bladder is unremarkable Stomach/Bowel: Esophageal tube tip at the proximal duodenum. Gastrostomy tube in the distal stomach. Mildly dilated fluid-filled small bowel with gradual transition to decompressed distal small bowel and terminal ileum in the right lower quadrant of the abdomen, series 3, image number 44. No bowel wall thickening. Moderate stool in the colon. Negative appendix. Vascular/Lymphatic: Extensive aortic atherosclerosis. No aneurysm. No significant adenopathy Reproductive: Prostate is unremarkable. Other: Negative for free air or free fluid. Musculoskeletal: Fixating  screw at L5. Chronic appearing deformity at L3 with posterior wedging of the vertebral body. Advanced degenerative changes throughout the lumbar spine. Ghost tracks within the pedicles and vertebra from L1 to S1 consistent with prior surgical hardware and posterior decompression. Probable scar tissue within the posterior spinal region. Suspected 2 by 3.9 cm fluid collection within the posterior paraspinal soft tissues. IMPRESSION: 1. Consolidation in the right lower lobe which may be secondary to atelectasis, pneumonia, or aspiration. Central obstructing  process is also possible as this is incompletely visualized. Consider dedicated chest CT for further evaluation. 2. Mildly dilated fluid-filled loops of proximal and mid small bowel with transition to decompressed distal and terminal ileum suggestive of low-grade bowel obstruction, less likely ileus. 3. Evidence of prior lumbar spine surgery and hardware removal. Soft tissue thickening within the posterior paraspinal soft tissues presumably due to postsurgical change; there is a 2 x 3.9 cm nonspecific fluid collection centrally within the soft tissue changes 4. Gallstones and or sludge. Electronically Signed   By: Donavan Foil M.D.   On: 2019-11-01 20:31   CT Chest Wo Contrast  Result Date: 11-01-19 CLINICAL DATA:  Shortness of breath.  Respiratory failure. EXAM: CT CHEST WITHOUT CONTRAST TECHNIQUE: Multidetector CT imaging of the chest was performed following the standard protocol without IV contrast. COMPARISON:  None. FINDINGS: Cardiovascular: The heart size is normal. Aortic calcifications are noted without evidence for thoracic aortic aneurysm. Advanced coronary artery calcifications are noted. There is no significant pericardial effusion. Mediastinum/Nodes: --No mediastinal or hilar lymphadenopathy. --No axillary lymphadenopathy. --No supraclavicular lymphadenopathy. --Normal thyroid gland. --there is an enteric tube that extends through the patient's esophagus and terminates in the region of the gastric pylorus. Lungs/Pleura: There is a tracheostomy tube in place that terminates above the carina. There is no pneumothorax. There is some atelectasis at the left lung base. There is essentially complete collapse of the right lower lobe which appears to be secondary to extensive endobronchial debris and plugging of the right lower lobe bronchus. There is some attenuation of the right middle lobe bronchus with persistent adequate aeration of the the right middle lobe. There is mild consolidation in the  posterior segments of the right upper lobe, likely representing aspiration. Upper Abdomen: There is cholelithiasis with questionable gallbladder wall thickening. A gastrostomy tube is noted. Musculoskeletal: There is an old healed proximal right humerus fracture. There are advanced degenerative changes of the left glenohumeral joint. Review of the MIP images confirms the above findings. IMPRESSION: 1. Complete collapse of the right lower lobe secondary to aspirated debris within the right lower lobe bronchus. 2. Consolidation in the posterior segments of the right upper lobe concerning for aspiration. 3. Cholelithiasis with questionable gallbladder wall thickening. Correlation with ultrasound is recommended. 4.  Aortic Atherosclerosis (ICD10-I70.0). Electronically Signed   By: Constance Holster M.D.   On: 01-Nov-2019 22:45   DG CHEST PORT 1 VIEW  Result Date: 10/21/2019 CLINICAL DATA:  Status post bronchoscopy. EXAM: PORTABLE CHEST 1 VIEW COMPARISON:  CT chest Nov 01, 2019, chest radiograph 2019/11/01 FINDINGS: Unchanged position of a tracheostomy tube. An enteric tube passes below the level of the left hemidiaphragm with tip excluded from the field of view. Overlying cardiac monitoring leads. Cardiomediastinal silhouette unchanged. Persistent opacity within the right mid to lower lung field which may reflect a combination of previously demonstrated right lower lobe collapse and right upper lobe consolidation. The left lung is clear. No evidence of pneumothorax. IMPRESSION: Persistent opacity throughout the right mid to lower lung field which  may reflect combination of previously demonstrated right lower lobe collapse and right upper lobe consolidation. Support apparatus as described. Electronically Signed   By: Jackey Loge DO   On: 10/21/2019 07:10   DG Chest Port 1 View  Result Date: 11/04/2019 CLINICAL DATA:  Hypotension EXAM: PORTABLE CHEST 1 VIEW COMPARISON:  None. FINDINGS: The tracheostomy tube  terminates above the carina. The tip of the enteric tube is not fully visualized on this study. The patient is rotated which significantly limits evaluation. There is suggestion of a right perihilar and right lower lung zone airspace opacity. There is no pneumothorax. There is mild vascular congestion. The cardiac silhouette appears to be at least mildly enlarged. Advanced degenerative changes are noted of the glenohumeral joints. IMPRESSION: 1. Limited study secondary to patient positioning. 2. Lines and tubes as above. The tip of the enteric tube is not reliably identified on this study. 3. Possible right lower lobe and right perihilar airspace opacities. These would be better evaluated with a repeat radiograph with improved patient positioning. Hypotension Electronically Signed   By: Katherine Mantle M.D.   On: 11/04/2019 19:20   DG Abd Portable 1V-Small Bowel Obstruction Protocol-24 hr delay  Result Date: 10/22/2019 CLINICAL DATA:  Small bowel obstruction.  24 hour delay. EXAM: PORTABLE ABDOMEN - 1 VIEW COMPARISON:  October 21, 2019 FINDINGS: There is now contrast in the colon to the level the sigmoid colon. Contrast persists in the left-sided loop of small bowel measuring 3.4 cm. A few other mildly prominent air-filled loops of small bowel are identified. No other acute interval changes. IMPRESSION: 1. Contrast is now seen in the colon to the level the sigmoid colon. Persistent mildly dilated loops of small bowel are seen, particularly in the left abdomen, worrisome for partial small bowel obstruction. Electronically Signed   By: Gerome Sam III M.D   On: 10/22/2019 07:22   DG Abd Portable 1V-Small Bowel Obstruction Protocol-initial, 8 hr delay  Result Date: 10/21/2019 CLINICAL DATA:  Bowel obstruction. EXAM: PORTABLE ABDOMEN - 1 VIEW COMPARISON:  CT dated 11-04-2019 FINDINGS: The enteric tube projects over the gastric pylorus. There is a small amount of oral contrast remaining in the stomach.  The majority of the patient's oral contrast remains within the small bowel with a small amount of oral contrast likely residing within the colon. There are persistent dilated loops of small bowel measuring up to approximately 4.7 cm. IMPRESSION: 1. The majority of the oral contrast remains within the small bowel. There is likely a small amount of oral contrast within the colon. 2. Persistent dilated loops of small bowel. 3. Enteric tube as above. Electronically Signed   By: Katherine Mantle M.D.   On: 10/21/2019 18:18    Anti-infectives: Anti-infectives (From admission, onward)   Start     Dose/Rate Route Frequency Ordered Stop   10/21/19 0800  vancomycin (VANCOREADY) IVPB 1250 mg/250 mL     1,250 mg 166.7 mL/hr over 90 Minutes Intravenous Every 12 hours Nov 04, 2019 2033     10/21/19 0430  ceFEPIme (MAXIPIME) 2 g in sodium chloride 0.9 % 100 mL IVPB     2 g 200 mL/hr over 30 Minutes Intravenous Every 8 hours 2019/11/04 2033     11/04/19 2030  vancomycin (VANCOCIN) IVPB 1000 mg/200 mL premix     1,000 mg 200 mL/hr over 60 Minutes Intravenous  Once 11-04-2019 1921 2019/11/04 2130   11-04-19 1845  ceFEPIme (MAXIPIME) 2 g in sodium chloride 0.9 % 100 mL IVPB  2 g 200 mL/hr over 30 Minutes Intravenous  Once 10/05/2019 1842 10/27/2019 2052   10/13/2019 1845  metroNIDAZOLE (FLAGYL) IVPB 500 mg     500 mg 100 mL/hr over 60 Minutes Intravenous  Once 10/17/2019 1842 11/01/2019 2018   10/12/2019 1845  vancomycin (VANCOCIN) IVPB 1000 mg/200 mL premix     1,000 mg 200 mL/hr over 60 Minutes Intravenous  Once 10/25/2019 1842 10/08/2019 2031       Assessment/Plan A fib Aspiration PNA w/ right mainstem occlusion s/p Bronch VDRF Gallstones - No RUQ tenderness  SBO - CT w/ mildly dilated fluid-filled loops of proximal and mid small bowel with transition to decompressed distal and terminal ileum suggestive of low-grade bowel obstruction - PEG and NGT appear in good position on CT - Started on SBO protocol 3/20.  Contrast noted in colon on xray this AM. He has had 3 BM's, minimal NGt output and is without nausea. Clamp NGT and PEG. Possible removal of NGT later today and start of trickle tube feeds tomorrow.  - Keep K > 4 and Mg > 2 for bowel function - We will follow along   FEN - NPO, IVF, clamp NGT and PEG VTE - SCDs, Per IM/CCM ID -Per IM/CCM    LOS: 2 days    Jacinto Halim , [facility] 10/22/2019, 9:46 AM Please see Amion for pager number during day hours 7:00am-4:30pm

## 2019-10-22 NOTE — Progress Notes (Signed)
pts wife at beside visiting.

## 2019-10-22 NOTE — Progress Notes (Signed)
  Date: 10/22/2019  Patient name: Brady [name]  Medical record number: [id number]  Date of birth: 01/11/1956   This patient's plan of care was discussed with the house staff. Please see Dr. Yetta Barre' note for complete details. I concur with her findings.   Inez Catalina, MD 10/22/2019, 8:39 PM

## 2019-10-23 ENCOUNTER — Inpatient Hospital Stay (HOSPITAL_COMMUNITY): Payer: Medicare Other

## 2019-10-23 DIAGNOSIS — J9690 Respiratory failure, unspecified, unspecified whether with hypoxia or hypercapnia: Secondary | ICD-10-CM

## 2019-10-23 DIAGNOSIS — I4891 Unspecified atrial fibrillation: Secondary | ICD-10-CM

## 2019-10-23 DIAGNOSIS — J690 Pneumonitis due to inhalation of food and vomit: Secondary | ICD-10-CM

## 2019-10-23 LAB — BASIC METABOLIC PANEL
Anion gap: 11 (ref 5–15)
BUN: 9 mg/dL (ref 8–23)
CO2: 25 mmol/L (ref 22–32)
Calcium: 8.3 mg/dL — ABNORMAL LOW (ref 8.9–10.3)
Chloride: 98 mmol/L (ref 98–111)
Creatinine, Ser: 0.33 mg/dL — ABNORMAL LOW (ref 0.61–1.24)
GFR calc Af Amer: >60 mL/min (ref >60)
GFR calc non Af Amer: >60 mL/min (ref >60)
Glucose, Bld: 98 mg/dL (ref 70–99)
Potassium: 3.1 mmol/L — ABNORMAL LOW (ref 3.5–5.1)
Sodium: 134 mmol/L — ABNORMAL LOW (ref 135–145)

## 2019-10-23 LAB — URINE CULTURE: Culture: 10000 — AB

## 2019-10-23 LAB — GLUCOSE, CAPILLARY
Glucose-Capillary: 92 mg/dL (ref 70–99)
Glucose-Capillary: 93 mg/dL (ref 70–99)
Glucose-Capillary: 95 mg/dL (ref 70–99)
Glucose-Capillary: 96 mg/dL (ref 70–99)

## 2019-10-23 LAB — CULTURE, RESPIRATORY W GRAM STAIN

## 2019-10-23 LAB — ECHOCARDIOGRAM COMPLETE
Height: 68 in
Weight: 4608 oz

## 2019-10-23 LAB — MAGNESIUM: Magnesium: 1.6 mg/dL — ABNORMAL LOW (ref 1.7–2.4)

## 2019-10-23 MED ORDER — LORAZEPAM 2 MG/ML IJ SOLN
0.5000 mg | Freq: Once | INTRAMUSCULAR | Status: AC
  Administered 2019-10-23: 0.5 mg via INTRAVENOUS
  Filled 2019-10-23: qty 1

## 2019-10-23 MED ORDER — BISACODYL 10 MG RE SUPP
10.0000 mg | Freq: Two times a day (BID) | RECTAL | Status: AC
  Administered 2019-10-23 (×2): 10 mg via RECTAL
  Filled 2019-10-23 (×2): qty 1

## 2019-10-23 MED ORDER — ATORVASTATIN CALCIUM 10 MG PO TABS
20.0000 mg | ORAL_TABLET | Freq: Every day | ORAL | Status: DC
  Administered 2019-10-23 – 2019-11-18 (×27): 20 mg
  Filled 2019-10-23 (×27): qty 2

## 2019-10-23 MED ORDER — MAGNESIUM SULFATE 4 GM/100ML IV SOLN
4.0000 g | Freq: Once | INTRAVENOUS | Status: AC
Start: 2019-10-23 — End: 2019-10-24
  Administered 2019-10-23: 18:00:00 4 g via INTRAVENOUS
  Filled 2019-10-23: qty 100

## 2019-10-23 MED ORDER — POLYETHYLENE GLYCOL 3350 17 GM/SCOOP PO POWD
1.0000 | Freq: Once | ORAL | Status: AC
  Administered 2019-10-23: 11:00:00 255 g via ORAL
  Filled 2019-10-23: qty 255

## 2019-10-23 MED ORDER — POTASSIUM CHLORIDE 10 MEQ/100ML IV SOLN
10.0000 meq | INTRAVENOUS | Status: AC
  Administered 2019-10-23 (×6): 10 meq via INTRAVENOUS
  Filled 2019-10-23 (×5): qty 100

## 2019-10-23 MED ORDER — ACETAMINOPHEN 10 MG/ML IV SOLN
1000.0000 mg | Freq: Once | INTRAVENOUS | Status: AC
  Administered 2019-10-23: 1000 mg via INTRAVENOUS
  Filled 2019-10-23: qty 100

## 2019-10-23 MED ORDER — ACETAMINOPHEN 160 MG/5ML PO SOLN
650.0000 mg | Freq: Four times a day (QID) | ORAL | Status: DC | PRN
  Administered 2019-10-24 – 2019-11-15 (×21): 650 mg
  Filled 2019-10-23 (×22): qty 20.3

## 2019-10-23 MED ORDER — VITAL HIGH PROTEIN PO LIQD
1000.0000 mL | ORAL | Status: DC
  Administered 2019-10-23: 1000 mL

## 2019-10-23 NOTE — Evaluation (Addendum)
Physical Therapy Evaluation Patient Details Name: Brady [name] MRN: [id number] DOB: Jul 21, 1956 Today's Date: 10/23/2019   History of Present Illness  Pt is a 64 yo male s/p ileus/SBO. Pt vented with trach since Jan 2021. PMHx; pt reports 12 spincal surgeries, Spinal stenosis, hepatitis C, hypertension, chronic respiratory failure trach and vent dependent.  Clinical Impression  Pt presents to PT with several of months of being bed bound at hospital and [facility]. Pt wants to go home and talks about having or getting equipment for home. Pt states he isn't going back to [facility] and wants to go home when off of the vent. Will continue to recommend LTACH/SNF at dc unless receive information that family able to care for pt at such a low level.     Follow Up Recommendations SNF;LTACH    Equipment Recommendations  Other (comment)(To be determined)    Recommendations for Other Services       Precautions / Restrictions Precautions Precautions: Fall;Other (comment) Precaution Comments: trach, peg tube Restrictions Weight Bearing Restrictions: No      Mobility  Bed Mobility Overal bed mobility: Needs Assistance             General bed mobility comments: deferred due to pain; pt's AROM, MMT tested. Pt just had bath prior to OT/PT arrival.  Transfers                 General transfer comment: deferred as pt has not been OOB at [facility] since Jan 2021.   Ambulation/Gait                Stairs            Wheelchair Mobility    Modified Rankin (Stroke Patients Only)       Balance                                             Pertinent Vitals/Pain Pain Assessment: Faces Faces Pain Scale: Hurts even more Pain Location: L LE Pain Descriptors / Indicators: Discomfort;Sore;Throbbing Pain Intervention(s): Limited activity within patient's tolerance;Monitored during session;Repositioned    Home Living Family/patient expects to be discharged to::  Unsure Living Arrangements: Spouse/significant other               Additional Comments: Pt  from [facility] since January. Pt reports ambulating with crutches prior to Jan 2021 and fell. Pt reports 12 back surgeries in lifetime and BLEs progressively weaker. Pt vent dependent since Jan 2021 at [facility].  Pt reports his spouse is available to assist, but is 5'' 100#. Pt reports a hoyer lift is being installed in home?    Prior Function Level of Independence: Needs assistance   Gait / Transfers Assistance Needed: Bedbound since Jan 2021  ADL's / Homemaking Assistance Needed: Pt's BUEs weak and pt requiring assist for all ADL. Pt unable to reach BLEs.        Hand Dominance   Dominant Hand: (ambidextrous)    Extremity/Trunk Assessment      Lower Extremity Assessment Lower Extremity Assessment: RLE deficits/detail;LLE deficits/detail RLE Deficits / Details: <2/5 strength hip/knee with assist required for all movment. 0/5 ankle LLE Deficits / Details: <2/5 strength for hip/knee. Ankle 0/5 with pt unable to tolerate touch/pressure to ankle/foot. Active assist for any movment.       Communication   Communication: Tracheostomy  Cognition Arousal/Alertness: Awake/alert Behavior During Therapy: [facility]  for tasks assessed/performed Overall Cognitive Status: Difficult to assess                                 General Comments: Pt with trach in and mouthing words for PLOF. Pt unable to express when he last walked or sat at EOB exactly.      General Comments General comments (skin integrity, edema, etc.): VSS with pt on vent    Exercises     Assessment/Plan    PT Assessment Patient needs continued PT services  PT Problem List Decreased strength;Decreased range of motion;Decreased activity tolerance;Decreased balance;Decreased mobility;Obesity;Pain       PT Treatment Interventions Functional mobility training;Therapeutic activities;Therapeutic exercise;Balance  training;Patient/family education;Neuromuscular re-education    PT Goals (Current goals can be found in the Care Plan section)  Acute Rehab PT Goals Patient Stated Goal: pt wants to go home PT Goal Formulation: With patient Time For Goal Achievement: 11/06/19 Potential to Achieve Goals: Fair    Frequency Min 2X/week   Barriers to discharge        Co-evaluation PT/OT/SLP Co-Evaluation/Treatment: Yes Reason for Co-Treatment: Complexity of the patient's impairments (multi-system involvement) PT goals addressed during session: Strengthening/ROM OT goals addressed during session: ADL's and self-care       AM-PAC PT "6 Clicks" Mobility  Outcome Measure Help needed turning from your back to your side while in a flat bed without using bedrails?: Total Help needed moving from lying on your back to sitting on the side of a flat bed without using bedrails?: Total Help needed moving to and from a bed to a chair (including a wheelchair)?: Total Help needed standing up from a chair using your arms (e.g., wheelchair or bedside chair)?: Total Help needed to walk in hospital room?: Total Help needed climbing 3-5 steps with a railing? : Total 6 Click Score: 6    End of Session   Activity Tolerance: Patient limited by pain Patient left: in bed;with call bell/phone within reach   PT Visit Diagnosis: Other abnormalities of gait and mobility (R26.89)    Time: 1250-1310 PT Time Calculation (min) (ACUTE ONLY): 20 min   Charges:   PT Evaluation $PT Eval Moderate Complexity: 1 Mod          [place] Pager [phone] Office [place] 10/23/2019, 4:24 PM

## 2019-10-23 NOTE — Progress Notes (Signed)
Pharmacy Antibiotic Note  Brady [name] is a 64 y.o. male admitted on 10/11/2019 with sepsis. Pharmacy has been consulted for vancomycin and cefepime dosing with concern for aspiration. Vancomycin off, Cr stable.  Plan: Continue cefepime 2g IV q8h  Height: 5\' 8"  (172.7 cm) Weight: 288 lb (130.6 kg)(not accurate weight) IBW/kg (Calculated) : 68.4  Temp (24hrs), Avg:98.5 F (36.9 C), Min:98 F (36.7 C), Max:98.9 F (37.2 C)  Recent Labs  Lab 10/06/2019 1916 10/28/2019 2112 10/21/19 0125 10/21/19 0301 10/21/19 0417 10/21/19 0956 10/21/19 1614 10/22/19 0048 10/22/19 1803 10/23/19 0710  WBC 10.3  --   --   --   --   --   --  13.6*  --   --   CREATININE 0.80  --   --    < >  --  0.46* 0.38* 0.32* 0.33* 0.33*  LATICACIDVEN 4.2* 4.4* 2.6*  --  2.5*  --   --   --   --   --    < > = values in this interval not displayed.    Estimated Creatinine Clearance: 123.1 mL/min (A) (by C-G formula based on SCr of 0.33 mg/dL (L)).    Not on File  Antimicrobials this admission: 3/19 vancomycin >> 3/21 3/19 cefepime >>  3/19 flagyl x1    Microbiology results: 3/20 Tracheal aspirate: rare Serratia 3/20 MRSA PCR: negative 3/19 BCx: NGTD 3/19 UCx: 10k Enterococcus faecalis  Thank you for allowing pharmacy to be a part of this patient's care.  4/19, PharmD, BCPS Clinical Pharmacist [phone] Please check AMION for all [facility] Pharmacy numbers 10/23/2019

## 2019-10-23 NOTE — Progress Notes (Signed)
   Subjective:   Pt seen at the bedside this morning. He is feeling well. No abdominal pain, nausea, or shortness of breath. Continues to have bowel movements. Explains he was constipated while at [facility] and thinks this led to his SBO.   Objective:  Vital signs in last 24 hours: Vitals:   10/23/19 0328 10/23/19 0400 10/23/19 0500 10/23/19 0600  BP:  112/62 122/64 118/68  Pulse:  88 92 96  Resp:  20 13 (!) 24  Temp:  98.6 F (37 C)    TempSrc:  Oral    SpO2: 96% 98% 99% 99%  Weight:      Height:       Constitution: NAD, supine in bed, chronically ill appearing HENT: trach in place, [place]/AT  Cardio: irregular rate & rhythm, no m/r/g  Abdominal: +BS, NTTP, soft  Assessment/Plan:  Active Problems:   Hypotension   Lactic acidosis   Small bowel obstruction (HCC)   VAP (ventilator-associated pneumonia) (HCC)   Acute on chronic respiratory failure (HCC)   S/P bronchoscopy  64yo male w PMH vent dependent respiratory failure, HLD, atypical depression, COPD, chronic low back pain, and new PEG tube placement 10/05/2019 presentingfrom [facility] with SBO and new onset atrial fibrillation.   Small Bowel Obstruction Hypokalemia  CT findings suggestive of low grade bowel obstruction. Repeat abdominal x-ray on 3/21 with contrast now in colon, persistent mildly dilated loops of small bowel seen in L abdomen (possible partial bowel obstruction). - continue aggressive bowel regimen with miralax, dulcolax, and colace  General surgery consulted - removed NG on 3/21 - begin trickle feeds - dietitian and SLP consulted - goal K > 4 and Mg > 2  Aspiration Pneumonia Vent Dependent Respiratory Failure  Underwent bronchoscopy and lavage on 3/20 with improvement in respiratory status. Respiratory culture with rare serratia marcescens. CXR on 3/21 with persistent RLL collapse, slight improved aeration of RUL, and clear left lung. - serratia sensitive to all antibiotics tested except cefazolin -  continue cefepime only - continue to follow cell count, AFB, and fungus culture  PCCM consulted - RLL collapse likely mucous plugging and aspiration event - PSV ventilation today, transition to trach collar trials if tolerates 2h  New Onset Atrial Fibrillationwith RVR Likely secondary to aspiration pneumonia and SBO. HR remains controlled. No history of a fib per faxed paperwork from [facility] - CHA2DS- VACs2 of 1 - will get TSH and echo to begin work-up  HypovolemicHyponatremia - resolved Symptoms and urine studies consistent with hypovolemic hyponatremia. Na 125 on admission.  - Na 134 this AM - will d/c D5NS infusion as pt is started on trickle feeds  Prior to Admission Living Arrangement: [facility] Anticipated Discharge Location: other LTAC vs ?[facility] vs SNF Barriers to Discharge: clinical improvement and facility options Dispo: Anticipated discharge pending clinical course  Thom Chimes, MD 10/23/2019, 6:45 AM Pager: [phone]

## 2019-10-23 NOTE — Progress Notes (Signed)
Patient refuse CPT vest at this time. Patient wants to rest he states

## 2019-10-23 NOTE — Progress Notes (Signed)
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  This improved with IV fluids.  Imaging right lower lobe consolidation.  Admit to progressive care with PCCM consult for vent management  History of present illness   Mr. Brady [name] is a 64 year old male who had Pseudomonas/Serratia pneumonia at [facility] in February 2021 and required tracheostomy.  He was transferred to [facility] and remains vent dependent.  Patient apparently developed an ileus over the last several days and then atrial fibrillation today.  He was given metoprolol (unclear dose) and developed hypotension.  He was given 1 L IV fluids, started on Levophed and transferred to the emergency department.   In the emergency department, he has not required any further vasopressors.  CT chest shows complete collapse of the right lower lobe with right upper lobe consolidation concerning for aspiration.  He was covered with with broad-spectrum antibiotics with vancomycin, cefepime and Flagyl.  CT abdomen pelvis shows ileus.  His overall status was stable, therefore internal medicine consulted for admission and PCCM consulted for vent management.  Covid-19 results are pending  Past Medical History  Spinal stenosis, hepatitis C, hypertension, chronic respiratory failure trach and vent dependent  Significant Hospital Events   3/19-admit to progressive care  Consults:  PCCM  Procedures:    Significant Diagnostic Tests:  3/19 CT abdomen pelvis>>Mildly dilated fluid-filled loops of proximal and mid small bowel with transition to decompressed distal and terminal ileum suggestive  of low-grade bowel obstruction 3/19 CT chest>>Complete collapse of the RLL secondary to aspirated debris within the RLL bronchus. Consolidation in the posterior segments of the RUL concerning for aspiration.Cholelithiasis with questionable gallbladder wall thickening.  3/19-3/20: significant stool burden on CT and KUB (personal interpretation)  Micro Data:  3/19 SARS-Covid-2>> pending 3/19 urine culture>>  3/19 blood cultures x2>>  Antimicrobials:  Cefepime 3/19- Flagyl 3/19- Vancomycin 3/19-  Interim history/subjective:  Awake and alert. Reports no bowel movements at [facility].   Objective   Blood pressure 109/63, pulse 84, temperature 98.6 F (37 C), temperature source Oral, resp. rate 13, height 5\' 8"  (1.727 m), weight 130.6 kg, SpO2 99 %.    Vent Mode: PRVC FiO2 (%):  [40 %] 40 % Set Rate:  [12 bmp-28 bmp] 12 bmp Vt Set:  [500 mL] 500 mL PEEP:  [5 cmH20] 5 cmH20 Plateau Pressure:  [10 cmH20-18 cmH20] 10 cmH20   Intake/Output Summary (Last 24 hours) at 10/23/2019 1022 Last data filed at 10/23/2019 1000 Gross per 24 hour  Intake 2215.77 ml  Output 585 ml  Net 1630.77 ml   Filed Weights   2019/10/29 2125 10/22/19 0500  Weight: 103 kg 130.6 kg   General: Chronically ill-appearing male in no acute distress HEENT: MM pink/moist, trach in place. Poor dentition.  Neuro: Awake and responsive. Legs weak.  CV: s1s2 regular rate and tachycardic, no m/r/g PULM: Decreased air movement right lower lobe GI: soft, bsx4 active  Extremities: warm/dry, no edema  Skin: no rashes or lesions   Resolved Hospital Problem list   Hypotension  Assessment & Plan:    Right lower lobe collapse, chronic trach and vent dependent Likely mucous plugging and aspiration event, history of Serratia and Pseudomonas  pneumonia Chronic ventilation may also impair LL expansion. - PSV ventilation today and transition to trach collar trials if tolerates 2h.   Atrial fibrillation, now back in  SR. -Management per primary team, consider echo, TSH, cards consult -CHA2DS-VASc2 score is 1  -suggest low dose Metoprolol 6.25 mg twice daily or cardizem gtt if RVR uncontrolled and BP will tolerate  Ileus secondary to severe constipation with overflow diarrhea.  -Trickle feeds. -Aggressive bowel regimen until clear.   Best practice:  Diet: N.p.o. Pain/Anxiety/Delirium protocol (if indicated): N/A VAP protocol (if indicated): Head of bed 30 degrees DVT prophylaxis: Lovenox GI prophylaxis: N/A Glucose control: N/A Mobility: Bedrest Code Status: Full code Family Communication: Per primary Disposition: Progressive care  Labs   CBC: Recent Labs  Lab 10/25/2019 1902 10/05/2019 1916 10/21/19 0519 10/22/19 0048  WBC  --  10.3  --  13.6*  NEUTROABS  --  6.6  --  10.1*  HGB 14.3 14.6 13.3 13.4  HCT 42.0 43.5 39.0 39.7  MCV  --  92.8  --  93.2  PLT  --  359  --  266    Basic Metabolic Panel: Recent Labs  Lab 10/27/2019 1916 10/21/19 0301 10/21/19 0956 10/21/19 1614 10/22/19 0048 10/22/19 1803 10/23/19 0710  NA 125*   < > 133* 134* 134* 132* 134*  K 3.2*   < > 3.2* 3.1* 3.4* 3.2* 3.1*  CL 80*   < > 90* 89* 95* 95* 98  CO2 29   < > 32 30 27 27 25   GLUCOSE 53*   < > 88 92 97 102* 98  BUN 48*   < > 25* 24* 19 13 9   CREATININE 0.80   < > 0.46* 0.38* 0.32* 0.33* 0.33*  CALCIUM 8.9   < > 8.5* 8.7* 8.5* 8.5* 8.3*  MG 1.5*  --   --  1.7 1.8  --   --    < > = values in this interval not displayed.   GFR: Estimated Creatinine Clearance: 123.1 mL/min (A) (by C-G formula based on SCr of 0.33 mg/dL (L)). Recent Labs  Lab 10/19/2019 1916 10/18/2019 2112 10/21/19 0125 10/21/19 0417 10/22/19 0048  WBC 10.3  --   --   --  13.6*  LATICACIDVEN 4.2* 4.4* 2.6* 2.5*  --     Liver Function Tests: Recent Labs  Lab 10/05/2019 1916 10/22/19 0048  AST 27 20  ALT 29 26  ALKPHOS 126 107  BILITOT 1.6* 1.7*  PROT 7.7 6.5  ALBUMIN 2.9* 2.5*   No results for input(s): LIPASE, AMYLASE in  the last 168 hours. No results for input(s): AMMONIA in the last 168 hours.  ABG    Component Value Date/Time   PHART 7.505 (H) 10/21/2019 0519   PCO2ART 38.7 10/21/2019 0519   PO2ART 77.0 (L) 10/21/2019 0519   HCO3 30.5 (H) 10/21/2019 0519   TCO2 32 10/21/2019 0519   O2SAT 96.0 10/21/2019 0519     Coagulation Profile: Recent Labs  Lab 10/28/2019 1916  INR 1.1    Cardiac Enzymes: No results for input(s): CKTOTAL, CKMB, CKMBINDEX, TROPONINI in the last 168 hours.  HbA1C: No results found for: HGBA1C  CBG: Recent Labs  Lab 10/21/19 1945 10/21/19 2313 10/22/19 0302 10/22/19 2334 10/23/19 0500  GLUCAP 81 78 88 90 93    10/24/19, MD  10/23/19 10:22 AM

## 2019-10-23 NOTE — Progress Notes (Signed)
Subjective: CC: Noted hypotension overnight. BP now improved and stable. Patient denies any nausea or emesis since ngt removed and peg tube clamped. He denies any abdominal distension or pain. He is passing flatus and had some loose stools yesterday.   Objective: Vital signs in last 24 hours: Temp:  [98 F (36.7 C)-98.9 F (37.2 C)] 98.6 F (37 C) (03/22 0400) Pulse Rate:  [65-103] 89 (03/22 0700) Resp:  [13-30] 22 (03/22 0700) BP: (56-122)/(39-82) 56/39 (03/22 0700) SpO2:  [90 %-100 %] 98 % (03/22 0700) FiO2 (%):  [40 %] 40 % (03/22 0400) Last BM Date: 10/22/19  Intake/Output from previous day: 03/21 0701 - 03/22 0700 In: 2491.7 [I.V.:1495.7; IV Piggyback:995.9] Out: 1010 [Urine:610; Stool:400] Intake/Output this shift: No intake/output data recorded.  Vent Mode: PRVC FiO2 (%):  [40 %] 40 % Set Rate:  [12 bmp-28 bmp] 12 bmp Vt Set:  [500 mL] 500 mL PEEP:  [5 cmH20] 5 cmH20 Plateau Pressure:  [15 cmH20-19 cmH20] 18 cmH20   PE: Gen: Awake and alert, on vent Lungs: Trached on vent with settings as above Abd: Soft, ND, NT, +BS, PEG tube in place and capped. Scar noted infra-umbilicial from anterior approach back surgery in the past that is well healed.    Lab Results:  Recent Labs    2019-11-04 1916 2019/11/04 1916 10/21/19 0519 10/22/19 0048  WBC 10.3  --   --  13.6*  HGB 14.6   < > 13.3 13.4  HCT 43.5   < > 39.0 39.7  PLT 359  --   --  266   < > = values in this interval not displayed.   BMET Recent Labs    10/22/19 1803 10/23/19 0710  NA 132* 134*  K 3.2* 3.1*  CL 95* 98  CO2 27 25  GLUCOSE 102* 98  BUN 13 9  CREATININE 0.33* 0.33*  CALCIUM 8.5* 8.3*   PT/INR Recent Labs    11-04-19 1916  LABPROT 14.4  INR 1.1   CMP     Component Value Date/Time   NA 134 (L) 10/23/2019 0710   K 3.1 (L) 10/23/2019 0710   CL 98 10/23/2019 0710   CO2 25 10/23/2019 0710   GLUCOSE 98 10/23/2019 0710   BUN 9 10/23/2019 0710   CREATININE 0.33 (L)  10/23/2019 0710   CALCIUM 8.3 (L) 10/23/2019 0710   PROT 6.5 10/22/2019 0048   ALBUMIN 2.5 (L) 10/22/2019 0048   AST 20 10/22/2019 0048   ALT 26 10/22/2019 0048   ALKPHOS 107 10/22/2019 0048   BILITOT 1.7 (H) 10/22/2019 0048   GFRNONAA >60 10/23/2019 0710   GFRAA >60 10/23/2019 0710   Lipase  No results found for: LIPASE     Studies/Results: DG CHEST PORT 1 VIEW  Result Date: 10/22/2019 CLINICAL DATA:  Pneumonia EXAM: PORTABLE CHEST 1 VIEW COMPARISON:  10/21/2019 FINDINGS: Tracheostomy tube, unchanged. Interval removal of enteric tube. Stable cardiomediastinal contours. Persistent right basilar opacity compatible with right lower lobe collapse. Slightly improved aeration of the right upper lobe. Left lung is clear. No pneumothorax. IMPRESSION: Persistent right lower lobe collapse. Slightly improved aeration of the right upper lobe. Left lung remains clear. Electronically Signed   By: Duanne Guess D.O.   On: 10/22/2019 12:50   DG Abd Portable 1V-Small Bowel Obstruction Protocol-24 hr delay  Result Date: 10/22/2019 CLINICAL DATA:  Small bowel obstruction.  24 hour delay. EXAM: PORTABLE ABDOMEN - 1 VIEW COMPARISON:  October 21, 2019 FINDINGS: There  is now contrast in the colon to the level the sigmoid colon. Contrast persists in the left-sided loop of small bowel measuring 3.4 cm. A few other mildly prominent air-filled loops of small bowel are identified. No other acute interval changes. IMPRESSION: 1. Contrast is now seen in the colon to the level the sigmoid colon. Persistent mildly dilated loops of small bowel are seen, particularly in the left abdomen, worrisome for partial small bowel obstruction. Electronically Signed   By: Dorise Bullion III M.D   On: 10/22/2019 07:22   DG Abd Portable 1V-Small Bowel Obstruction Protocol-initial, 8 hr delay  Result Date: 10/21/2019 CLINICAL DATA:  Bowel obstruction. EXAM: PORTABLE ABDOMEN - 1 VIEW COMPARISON:  CT dated October 20, 2019 FINDINGS: The  enteric tube projects over the gastric pylorus. There is a small amount of oral contrast remaining in the stomach. The majority of the patient's oral contrast remains within the small bowel with a small amount of oral contrast likely residing within the colon. There are persistent dilated loops of small bowel measuring up to approximately 4.7 cm. IMPRESSION: 1. The majority of the oral contrast remains within the small bowel. There is likely a small amount of oral contrast within the colon. 2. Persistent dilated loops of small bowel. 3. Enteric tube as above. Electronically Signed   By: Constance Holster M.D.   On: 10/21/2019 18:18    Anti-infectives: Anti-infectives (From admission, onward)   Start     Dose/Rate Route Frequency Ordered Stop   10/21/19 0800  vancomycin (VANCOREADY) IVPB 1250 mg/250 mL  Status:  Discontinued     1,250 mg 166.7 mL/hr over 90 Minutes Intravenous Every 12 hours 10/19/2019 2033 10/22/19 1301   10/21/19 0430  ceFEPIme (MAXIPIME) 2 g in sodium chloride 0.9 % 100 mL IVPB     2 g 200 mL/hr over 30 Minutes Intravenous Every 8 hours 10/25/2019 2033     10/14/2019 2030  vancomycin (VANCOCIN) IVPB 1000 mg/200 mL premix     1,000 mg 200 mL/hr over 60 Minutes Intravenous  Once 10/22/2019 1921 10/19/2019 2130   10/10/2019 1845  ceFEPIme (MAXIPIME) 2 g in sodium chloride 0.9 % 100 mL IVPB     2 g 200 mL/hr over 30 Minutes Intravenous  Once 10/14/2019 1842 10/03/2019 2052   10/23/2019 1845  metroNIDAZOLE (FLAGYL) IVPB 500 mg     500 mg 100 mL/hr over 60 Minutes Intravenous  Once 10/31/2019 1842 10/10/2019 2018   11/01/2019 1845  vancomycin (VANCOCIN) IVPB 1000 mg/200 mL premix     1,000 mg 200 mL/hr over 60 Minutes Intravenous  Once 10/28/2019 1842 10/22/2019 2031       Assessment/Plan A fib Aspiration PNA w/ right mainstem occlusion s/p Bronch VDRF Gallstones - No RUQ tenderness  SBO - Contrast noted in colon on SBO protocol xrays. Tolerated PEG clamping and NGT removal - PEG appears in  good position on CT. Okay to start trickle tube feeds today - SLP eval - Electrolytes pending on am labs. Keep K > 4 and Mg > 2 for bowel function - We will follow along  FEN -Start Trickle TF's. SLP eval VTE -SCDs, Per IM/CCM ID - Cefepime, Per IM/CCM   LOS: 3 days    Jillyn Ledger , [facility] 10/23/2019, 8:05 AM Please see Amion for pager number during day hours 7:00am-4:30pm

## 2019-10-23 NOTE — Progress Notes (Signed)
Echocardiogram 2D Echocardiogram has been performed.  Brady [name] 10/23/2019, 12:24 PM

## 2019-10-23 NOTE — Progress Notes (Signed)
Initial Nutrition Assessment  DOCUMENTATION CODES:   Morbid obesity  INTERVENTION:   Tube Feeding via PEG tube:  Begin Vital High Protein at 20 ml/hr today Goal Regimen: Vital High Protein at 65 ml/hr with Pro-Stat 30 mL BID Provides 1760 kcals, 167 g of protein and 1310 mL of free water  Recommend additional free water flush of 200 mL q 6 hours: total free water 2110 mL  NUTRITION DIAGNOSIS:   Inadequate oral intake related to inability to eat as evidenced by NPO status.   GOAL:   Patient will meet greater than or equal to 90% of their needs   MONITOR:   TF tolerance, Diet advancement, Labs, Weight trends, Skin  REASON FOR ASSESSMENT:   Consult Enteral/tube feeding initiation and management  ASSESSMENT:   64 yo male admitted with RLL collapse with aspiration and mucous plugging, ileus secondary to severe constipation. PMH includes spinal stenosis, hepatitis C, HTN, chronic respiratory failure with trach on vent support with PEG tube since 09/2019  3/19 Admitted, CT abdomen suggestive of low grade bowel obstruction 3/20 Bronch: chipped tooth (3 mm x 5 mm) removed from RLL, thick, purulent secretions removed from RML  Pt remains on vent support via trach, plan to begin TC trials  +flatus, some loose stools yesterday No nausea with NG removed and PEG tube clamped, per surgery ok to trial trickle TF today  Current weight 130.6 kg; admit weight 103 kg. No previous weight encounters. Net + 3.4 L. Non-pitting edema in b/l LE.   No pressure injuries noted per RN skin assessment  Labs: sodium 134 (L), potassium 3.1 (L) Meds: D5-NS at 75 ml/hr, KCl    Diet Order:   Diet Order            Diet NPO time specified  Diet effective now              EDUCATION NEEDS:   Not appropriate for education at this time  Skin:  Skin Assessment: Skin Integrity Issues: Skin Integrity Issues:: Other (Comment) Other: MASD to bilateral buttock  Last BM:  3/22  Height:    Ht Readings from Last 1 Encounters:  10/10/2019 5\' 8"  (1.727 m)    Weight:   Wt Readings from Last 1 Encounters:  10/22/19 130.6 kg    Ideal Body Weight:  70 kg  BMI:  Body mass index is 43.79 kg/m.  Estimated Nutritional Needs:   Kcal:  1550-1750 kcals  Protein:  140-175  Fluid:  >/= 2 L    10/24/19 MS, RDN, LDN, CNSC RD Pager Number and Weekend/On-Call After Hours Pager Located in [place]

## 2019-10-23 NOTE — Progress Notes (Signed)
Pt reports feeling full and requesting to stop tube feeds for a bit. Tube feeds paused by this RN. Abdomen soft, no complaint of abdominal pain, pt still passing gas and stool. Discussed resuming feeds with pt when he is ready to do so.

## 2019-10-23 NOTE — Evaluation (Signed)
Occupational Therapy Evaluation Patient Details Name: Brady [name] MRN: [id number] DOB: 1956/02/02 Today's Date: 10/23/2019    History of Present Illness Pt is a 64 yo male s/p ileus/SBO. Pt vented with trach since Jan 2021. PMHx; pt reports 12 spincal surgeries, Spinal stenosis, hepatitis C, hypertension, chronic respiratory failure trach and vent dependent.   Clinical Impression   Pt PTA: Pt Pt D/C from [facility] since January. Pt reports ambulating with crutches prior to Jan 2021 and fell. Pt reports 12 back surgeries in lifetime and BLEs progressively weaker. Pt with trach and vent dependent since Jan 2021 at [facility].  Pt reports his spouse is available to assist, but is 5'' 100#. Pt reports a hoyer lift is being installed in home? Pt currently severely limited by decreased ability to care for self, severe debility due to bedbound for 2 months and decreaed actiivity tolerance. Pt totalA for ADL nearly. Pt would greatly benefit from continued OT skilled services for ADL, mobility and energy conservation. OT following acutely.    Follow Up Recommendations  [facility]    Equipment Recommendations  Hospital bed(to be determined)    Recommendations for Other Services       Precautions / Restrictions Precautions Precautions: Fall;Other (comment) Precaution Comments: trach, peg tube Restrictions Weight Bearing Restrictions: No      Mobility Bed Mobility Overal bed mobility: Needs Assistance             General bed mobility comments: deferred due to pain; pt's AROM, MMT tested. Pt just had bath prior to OT/PT arrival.  Transfers                 General transfer comment: deferred as pt has not been OOB at [facility] since Jan 2021.     Balance                                           ADL either performed or assessed with clinical judgement   ADL Overall ADL's : Needs assistance/impaired Eating/Feeding: Total assistance;Bed level   Grooming:  Moderate assistance;Bed level   Upper Body Bathing: Maximal assistance;Total assistance;Bed level   Lower Body Bathing: Total assistance;Bed level   Upper Body Dressing : Maximal assistance;Bed level   Lower Body Dressing: Total assistance;Bed level   Toilet Transfer: Total assistance;+2 for physical assistance;+2 for safety/equipment Toilet Transfer Details (indicate cue type and reason): must use lift Toileting- Clothing Manipulation and Hygiene: Total assistance;Bed level       Functional mobility during ADLs: Total assistance;+2 for physical assistance General ADL Comments: Pt limited by decreased ability to care for self, severe debility due to bedbound for 2 months and decreaed actiivity tolerance.     Vision Baseline Vision/History: Wears glasses Wears Glasses: At all times Patient Visual Report: No change from baseline Vision Assessment?: No apparent visual deficits     Perception     Praxis      Pertinent Vitals/Pain Pain Assessment: Faces Faces Pain Scale: Hurts even more Pain Location: L LE Pain Descriptors / Indicators: Discomfort;Sore;Throbbing Pain Intervention(s): Monitored during session;Premedicated before session     Hand Dominance (ambidextrous)   Extremity/Trunk Assessment Upper Extremity Assessment Upper Extremity Assessment: Generalized weakness;RUE deficits/detail;LUE deficits/detail RUE Deficits / Details: 3+/5 hand squeeze;  poor mobility at shoulder RUE Coordination: decreased gross motor LUE Deficits / Details: 3+/5 hand squeeze; minimal above shoulder movements LUE Coordination: decreased gross motor  Lower Extremity Assessment Lower Extremity Assessment: Defer to PT evaluation;LLE deficits/detail LLE Deficits / Details: L hip pain       Communication Communication Communication: Tracheostomy   Cognition Arousal/Alertness: Awake/alert Behavior During Therapy: WFL for tasks assessed/performed Overall Cognitive Status: Difficult to  assess                                 General Comments: Pt with trach in and mouthing words for PLOF. Pt unable to express when he last walked or sat at EOB exactly.   General Comments  Pt on trach collar, vented; BP 97/59, O2 >95%, 91 BPM.    Exercises     Shoulder Instructions      Home Living Family/patient expects to be discharged to:: Unsure Living Arrangements: Spouse/significant other                               Additional Comments: Pt D/C from [facility] since January. Pt reports ambulating with crutches prior to Jan 2021 and fell. Pt reports 12 back surgeries in lifetime and BLEs progressively weaker. Pt vent dependent since Jan 2021 at [facility].  Pt reports his spouse is available to assist, but is 5'' 100#. Pt reports a hoyer lift is being installed in home?      Prior Functioning/Environment Level of Independence: Needs assistance  Gait / Transfers Assistance Needed: Bedbound since Jan 2021 ADL's / Homemaking Assistance Needed: Pt's BUEs weak and pt requiring assist for all ADL. Pt unable to reach BLEs. Communication / Swallowing Assistance Needed: trach          OT Problem List:        OT Treatment/Interventions: Self-care/ADL training;Therapeutic exercise;Energy conservation;Therapeutic activities;Patient/family education;Balance training;DME and/or AE instruction    OT Goals(Current goals can be found in the care plan section) Acute Rehab OT Goals Patient Stated Goal: to sit at EOB OT Goal Formulation: With patient Time For Goal Achievement: 11/06/19 Potential to Achieve Goals: Good ADL Goals Pt Will Perform Grooming: with min guard assist;sitting Pt Will Perform Upper Body Dressing: with min assist;sitting Pt/caregiver will Perform Home Exercise Program: Increased strength;Right Upper extremity;Left upper extremity;With Supervision Additional ADL Goal #1: Pt will increase to modA for bed mobility as precursor for OOB ADL  tasks. Additional ADL Goal #2: Pt will tolerate sitting EOB with maxA overall x3 mins in prep for OOB ADL.  OT Frequency: Min 2X/week   Barriers to D/C: Decreased caregiver support  decreased CG support at home       Co-evaluation PT/OT/SLP Co-Evaluation/Treatment: Yes Reason for Co-Treatment: Complexity of the patient's impairments (multi-system involvement);To address functional/ADL transfers   OT goals addressed during session: ADL's and self-care      AM-PAC OT "6 Clicks" Daily Activity     Outcome Measure Help from another person eating meals?: Total Help from another person taking care of personal grooming?: Total Help from another person toileting, which includes using toliet, bedpan, or urinal?: Total Help from another person bathing (including washing, rinsing, drying)?: Total Help from another person to put on and taking off regular upper body clothing?: A Little Help from another person to put on and taking off regular lower body clothing?: Total 6 Click Score: 8   End of Session Equipment Utilized During Treatment: Oxygen Nurse Communication: Mobility status  Activity Tolerance: Patient limited by pain;Treatment limited secondary to medical complications (Comment) Patient left:  in bed;with call bell/phone within reach  OT Visit Diagnosis: Muscle weakness (generalized) (M62.81);Pain Pain - Right/Left: Left Pain - part of body: Leg                Time: 1250-1311 OT Time Calculation (min): 21 min Charges:  OT General Charges $OT Visit: 1 Visit OT Evaluation $OT Eval Moderate Complexity: 1 Mod  Flora Lipps, OTR/L Acute Rehabilitation Services Pager: [phone] Office: [phone]   Denajah Farias C 10/23/2019, 4:02 PM

## 2019-10-23 NOTE — Progress Notes (Signed)
SLP Cancellation Note  Patient Details Name: Brady [name] MRN: [id number] DOB: 08-19-1955   Cancelled treatment:   SLP order received. Pt is currently still on the vent. Will follow up for evaluation once he has transitioned to ATC.         Edie Vallandingham L. Samson Frederic, MA CCC/SLP Acute Rehabilitation Services Office number [phone] Pager [phone]  Carolan Shiver 10/23/2019, 12:11 PM

## 2019-10-24 ENCOUNTER — Encounter (HOSPITAL_COMMUNITY): Payer: Self-pay | Admitting: Internal Medicine

## 2019-10-24 LAB — GLUCOSE, CAPILLARY
Glucose-Capillary: 73 mg/dL (ref 70–99)
Glucose-Capillary: 78 mg/dL (ref 70–99)
Glucose-Capillary: 79 mg/dL (ref 70–99)
Glucose-Capillary: 82 mg/dL (ref 70–99)
Glucose-Capillary: 97 mg/dL (ref 70–99)
Glucose-Capillary: 97 mg/dL (ref 70–99)

## 2019-10-24 LAB — BASIC METABOLIC PANEL
Anion gap: 12 (ref 5–15)
BUN: 6 mg/dL — ABNORMAL LOW (ref 8–23)
CO2: 23 mmol/L (ref 22–32)
Calcium: 8.5 mg/dL — ABNORMAL LOW (ref 8.9–10.3)
Chloride: 100 mmol/L (ref 98–111)
Creatinine, Ser: <0.3 mg/dL — ABNORMAL LOW (ref 0.61–1.24)
Glucose, Bld: 83 mg/dL (ref 70–99)
Potassium: 3.1 mmol/L — ABNORMAL LOW (ref 3.5–5.1)
Sodium: 135 mmol/L (ref 135–145)

## 2019-10-24 LAB — PHOSPHORUS: Phosphorus: 3.8 mg/dL (ref 2.5–4.6)

## 2019-10-24 LAB — MAGNESIUM: Magnesium: 1.7 mg/dL (ref 1.7–2.4)

## 2019-10-24 MED ORDER — SORBITOL 70 % SOLN
960.0000 mL | TOPICAL_OIL | Freq: Two times a day (BID) | ORAL | Status: AC
  Administered 2019-10-24: 960 mL via RECTAL
  Filled 2019-10-24 (×2): qty 473

## 2019-10-24 MED ORDER — SODIUM CHLORIDE 0.9 % IV SOLN
2.0000 g | Freq: Three times a day (TID) | INTRAVENOUS | Status: DC
  Administered 2019-10-24 – 2019-10-27 (×9): 2 g via INTRAVENOUS
  Filled 2019-10-24 (×12): qty 2

## 2019-10-24 MED ORDER — METOPROLOL TARTRATE 12.5 MG HALF TABLET: 12.5000 mg | ORAL_TABLET | Freq: Two times a day (BID) | ORAL | Status: DC

## 2019-10-24 MED ORDER — LORAZEPAM 2 MG/ML IJ SOLN
0.5000 mg | Freq: Once | INTRAMUSCULAR | Status: AC
  Administered 2019-10-24: 0.5 mg via INTRAVENOUS
  Filled 2019-10-24: qty 1

## 2019-10-24 MED ORDER — CHLORHEXIDINE GLUCONATE CLOTH 2 % EX PADS
6.0000 | MEDICATED_PAD | Freq: Every day | CUTANEOUS | Status: DC
  Administered 2019-10-24 – 2019-11-05 (×14): 6 via TOPICAL

## 2019-10-24 MED ORDER — SODIUM CHLORIDE 0.9 % IV SOLN: 1.0000 g | INTRAVENOUS | Status: DC

## 2019-10-24 MED ORDER — VITAL HIGH PROTEIN PO LIQD
1000.0000 mL | ORAL | Status: DC
  Administered 2019-10-25 – 2019-10-29 (×5): 1000 mL

## 2019-10-24 MED ORDER — BUSPIRONE HCL 10 MG PO TABS
5.0000 mg | ORAL_TABLET | Freq: Three times a day (TID) | ORAL | Status: DC
  Administered 2019-10-24 – 2019-11-18 (×78): 5 mg
  Filled 2019-10-24 (×78): qty 1

## 2019-10-24 MED ORDER — QUETIAPINE FUMARATE 25 MG PO TABS
25.0000 mg | ORAL_TABLET | Freq: Two times a day (BID) | ORAL | Status: DC
  Administered 2019-10-24 – 2019-11-17 (×49): 25 mg
  Filled 2019-10-24 (×49): qty 1

## 2019-10-24 MED ORDER — METOCLOPRAMIDE HCL 5 MG/ML IJ SOLN
10.0000 mg | Freq: Four times a day (QID) | INTRAMUSCULAR | Status: AC
  Administered 2019-10-24 – 2019-10-26 (×8): 10 mg via INTRAVENOUS
  Filled 2019-10-24 (×8): qty 2

## 2019-10-24 MED ORDER — PRO-STAT SUGAR FREE PO LIQD
30.0000 mL | Freq: Two times a day (BID) | ORAL | Status: DC
  Administered 2019-10-24 – 2019-10-30 (×14): 30 mL
  Filled 2019-10-24 (×13): qty 30

## 2019-10-24 NOTE — Progress Notes (Signed)
SLP Cancellation Note  Patient Details Name: Edu On MRN: [id number] DOB: 1955-10-20   Cancelled assessment:      Orders received for swallow assessment per Surgery.  Discussed status with Selena Batten, RT - pt with minimal air movement during ATC trials, placed back on PSV. When appropriate, pt will benefit from Passy-Muir Valve evaluation prior to moving forward with swallow assessment.  If ATC trials are not tolerated, inline PMV may be more appropriate. Have requested PMV orders from attending.  Marcianna Daily L. Samson Frederic, MA CCC/SLP Acute Rehabilitation Services Office number [phone] Pager [phone]    Blenda Mounts Laurice 10/24/2019, 10:49 AM

## 2019-10-24 NOTE — Progress Notes (Signed)
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  This improved with IV fluids.  Imaging right lower lobe consolidation.  Admit to progressive care with PCCM consult for vent management  Past Medical History  Spinal stenosis, hepatitis C, hypertension, chronic respiratory failure trach and vent dependent  Significant Hospital Events   3/19-admit to progressive care  Consults:  PCCM General surgery  Procedures:   Significant Diagnostic Tests:  3/19 CT abdomen pelvis>>Mildly dilated fluid-filled loops of proximal and mid small bowel with transition to decompressed distal and terminal ileum suggestive of low-grade bowel obstruction 3/19 CT chest>>Complete collapse of the RLL secondary to aspirated debris within the RLL bronchus. Consolidation in the posterior segments of the RUL concerning for aspiration.Cholelithiasis with questionable gallbladder wall thickening.  3/19-3/20: significant stool burden on CT and KUB (personal interpretation) 3/23> Echo: LVEF 55-60% without RWMA and with trivial mitral valve regurgitation  Micro Data:  3/19 SARS-Covid-2>> negative 3/19 urine culture>> E. Faecalis  3/19 blood cultures x2>> negative x4 days 3/20 respiratory culture >> Serratia marcescens, R-cefazolin  Antimicrobials:  Cefepime 3/19>> Flagyl 3/19 Vancomycin 3/19>3/21  Interim history/subjective:  Awake and alert. No acute distress. He notes feeling well over night  Objective   Blood pressure 116/71, pulse 84, temperature 98 F (36.7 C), resp. rate 15, height 5\' 8"  (1.727 m), weight 134.3 kg, SpO2 (!) 86 %.     Vent Mode: PRVC FiO2 (%):  [40 %] 40 % Set Rate:  [12 bmp] 12 bmp Vt Set:  [500 mL] 500 mL PEEP:  [5 cmH20] 5 cmH20 Pressure Support:  [10 cmH20] 10 cmH20 Plateau Pressure:  [10 cmH20-12 cmH20] 12 cmH20   Intake/Output Summary (Last 24 hours) at 10/24/2019 0835 Last data filed at 10/24/2019 10/26/2019 Gross per 24 hour  Intake 1598.98 ml  Output 900 ml  Net 698.98 ml   Filed Weights   10/22/2019 2125 10/22/19 0500 10/24/19 0500  Weight: 103 kg 130.6 kg 134.3 kg   General: Chronically ill-appearing male in no acute distress HEENT: MM pink/moist, trach in place. Poor dentition.  Neuro: Awake and responsive. No obvious cranial nerve deficits; sensation grossly intact in bilateral upper and lower extremities; strength 1/5 in bilateral lower extremities CV: s1s2 present, RRR, no m/r/g PULM: Decreased air movement right lower lobe GI: nondistended, nontender, soft, bsx4 active  Extremities: no edema appreciated; RLE cool to touch compared with LLE Skin: chronic venous stasis changes of bilateral lower extremities  Resolved Hospital Problem list   Hypotension  Assessment & Plan:   Right lower lobe collapse, chronic trach and vent dependent Likely mucous plugging and aspiration event, history of Serratia and Pseudomonas pneumonia. Chronic ventilation may also impair LL expansion. Patient not able to tolerate extended trach collar yesterday due to fatigue.  - PSV ventilation today  - Chest physiotherapy - Trach collar if tolerating at least 2 hours of trial  Atrial fibrillation, now back in SR. CHA2DS-VASc2 score is 1. Patient is on metoprolol 12.5mg  bid and currently in sinus rhythm. Echo w/LVEF 55-60% without any RWMA and no apparent valvular abnormalities.  -TSH pending - Continue management per primary team   Ileus secondary to severe constipation with overflow diarrhea.  Patient admitted for SBO, treated with aggressive bowel regimen. He has been tolerating trickle feeds. Patient  having BM's and passing flatus.  - Continue bowel regimen - Advance tube feeds as tolerated  Best practice:  Diet: Advance tube feeds as tolerated Pain/Anxiety/Delirium protocol (if indicated): N/A VAP protocol (if indicated): Head of bed 30 degrees DVT prophylaxis: Lovenox GI prophylaxis: N/A Glucose control: N/A Mobility: Bedrest Code Status: Full code Family Communication: Per primary Disposition: Progressive care  Labs   CBC: Recent Labs  Lab 11/01/2019 1902 11/01/2019 1916 10/21/19 0519 10/22/19 0048  WBC  --  10.3  --  13.6*  NEUTROABS  --  6.6  --  10.1*  HGB 14.3 14.6 13.3 13.4  HCT 42.0 43.5 39.0 39.7  MCV  --  92.8  --  93.2  PLT  --  359  --  811    Basic Metabolic Panel: Recent Labs  Lab 10/04/2019 1916 10/21/19 0301 10/21/19 0956 10/21/19 1614 10/22/19 0048 10/22/19 1803 10/23/19 0710  NA 125*   < > 133* 134* 134* 132* 134*  K 3.2*   < > 3.2* 3.1* 3.4* 3.2* 3.1*  CL 80*   < > 90* 89* 95* 95* 98  CO2 29   < > 32 30 27 27 25   GLUCOSE 53*   < > 88 92 97 102* 98  BUN 48*   < > 25* 24* 19 13 9   CREATININE 0.80   < > 0.46* 0.38* 0.32* 0.33* 0.33*  CALCIUM 8.9   < > 8.5* 8.7* 8.5* 8.5* 8.3*  MG 1.5*  --   --  1.7 1.8  --  1.6*   < > = values in this interval not displayed.   GFR: Estimated Creatinine Clearance: 125.1 mL/min (A) (by C-G formula based on SCr of 0.33 mg/dL (L)). Recent Labs  Lab 10/11/2019 1916 10/26/2019 2112 10/21/19 0125 10/21/19 0417 10/22/19 0048  WBC 10.3  --   --   --  13.6*  LATICACIDVEN 4.2* 4.4* 2.6* 2.5*  --     Liver Function Tests: Recent Labs  Lab 10/12/2019 1916 10/22/19 0048  AST 27 20  ALT 29 26  ALKPHOS 126 107  BILITOT 1.6* 1.7*  PROT 7.7 6.5  ALBUMIN 2.9* 2.5*   No results for input(s): LIPASE, AMYLASE in the last 168 hours. No results for input(s): AMMONIA in the last 168 hours.  ABG    Component Value Date/Time   PHART 7.505 (H) 10/21/2019 0519   PCO2ART 38.7 10/21/2019 0519   PO2ART 77.0 (L)  10/21/2019 0519   HCO3 30.5 (H) 10/21/2019 0519   TCO2 32 10/21/2019 0519   O2SAT 96.0 10/21/2019 0519     Coagulation Profile: Recent Labs  Lab 10/06/2019 1916  INR 1.1    Cardiac Enzymes: No results for input(s): CKTOTAL, CKMB, CKMBINDEX, TROPONINI in the last 168 hours.  HbA1C: No results found for: HGBA1C  CBG: Recent Labs  Lab 10/23/19 1529 10/23/19 1952 10/23/19 2343 10/24/19 0415 10/24/19 0721  GLUCAP 92 95 96 78 73     Critical care time: 35 minutes  Harvie Heck, MD  Internal Medicine, PGY-1 10/24/19 8:35 AM

## 2019-10-24 NOTE — Final Consult Note (Signed)
Consultant Final Sign-Off Note    Assessment/Final recommendations  Brady [name] is a 64 y.o. male followed by me for SBO  - CT 3/19 w/ mildly dilated fluid-filled loops of proximal and mid small bowel with transition to decompressed distal and terminal ileum suggestive of low-grade bowel obstruction - Contrast noted in colon on SBO protocol xrays 3/21.  - Tolerated trickle tube feeds 3/22 - Okay to adv tube feeds to goal  Thank you for allowing Brady [name] to participate in the care of your patient.  Please consult Brady [name] again if you have further needs for your patient.   Wound care (if applicable):    Diet at discharge: per primary team    Activity at discharge: per primary team   Follow-up appointment: PRN   Pending results:  Unresulted Labs (From admission, onward)    Start     Ordered   10/24/19 0272  Basic metabolic panel  Daily,   R    Question:  Specimen collection method  Answer:  Unit=Unit collect   10/24/19 0605   10/24/19 0606  Magnesium  Daily,   R    Question:  Specimen collection method  Answer:  Unit=Unit collect   10/24/19 0605   10/23/19 1142  TSH  Once,   R    Question:  Specimen collection method  Answer:  Lab=Lab collect   10/23/19 1141   10/21/19 0631  Body fluid cell count with differential  Once,   STAT    Question Answer Comment  Are there also cytology or pathology orders on this specimen? No   Release to patient Immediate      10/21/19 0631   10/21/19 0631  Culture, bal-quantitative  Once,   R    Question Answer Comment  Are there also cytology or pathology orders on this specimen? No   Patient immune status Normal      10/21/19 0631   10/21/19 0631  Acid Fast Smear (AFB)  (AFB smear + Culture w reflexed sensitivities panel)  Once,   STAT    Question:  Patient immune status  Answer:  Normal   10/21/19 0631   10/21/19 0631  Acid Fast Culture with reflexed sensitivities  (AFB smear + Culture w reflexed sensitivities panel)  Once,   STAT    Question:   Patient immune status  Answer:  Normal   10/21/19 0631   10/21/19 0631  Culture, fungus without smear  Once,   STAT    Question:  Patient immune status  Answer:  Normal   10/21/19 0631           Medication recommendations:    Other recommendations: Okay to adv tube feeds to goal. Keep K > 4 and Mg > 2 for bowel function Flush tube with 30 ml (20 ml if fluid restricted) sterile water every 4 hours, before and after medication administration, and when continuous feeding is interrupted.    Thank you for allowing Brady [name] to participate in the care of your patient!  Please consult Brady [name] again if you have further needs for your patient.  Barth Kirks [facility] 10/24/2019 7:54 AM    Subjective   Doing well. Tolerated trickle tube feeds yesterday without abdominal pain, nausea or emesis. He did feel full at one point during the day. He continues to pass flatus and had 2 bm's in the last 24 hours.   Objective  Vital signs in last 24 hours: Temp:  [97.5 F (36.4 C)-98.8 F (37.1 C)] 98 F (36.7 C) (  03/23 0722) Pulse Rate:  [52-103] 84 (03/23 0600) Resp:  [11-30] 15 (03/23 0600) BP: (86-130)/(42-99) 116/71 (03/23 0600) SpO2:  [88 %-100 %] 97 % (03/23 0600) FiO2 (%):  [40 %] 40 % (03/23 0333) Weight:  [134.3 kg] 134.3 kg (03/23 0500)  Vent Mode: PRVC FiO2 (%):  [40 %] 40 % Set Rate:  [12 bmp] 12 bmp Vt Set:  [500 mL] 500 mL PEEP:  [5 cmH20] 5 cmH20 Pressure Support:  [10 cmH20] 10 cmH20 Plateau Pressure:  [10 cmH20-12 cmH20] 12 cmH20  Gen: Awake and alert, on vent Lungs: Trached on vent with settings as above Abd: Soft, ND, NT, +BS, PEG tube in place. Scar noted infra-umbilicial from anterior approach back surgery in the past that is well healed.    Pertinent labs and Studies: Recent Labs    10/22/19 0048  WBC 13.6*  HGB 13.4  HCT 39.7   BMET Recent Labs    10/22/19 1803 10/23/19 0710  NA 132* 134*  K 3.2* 3.1*  CL 95* 98  CO2 27 25  GLUCOSE 102* 98  BUN 13 9  CREATININE  0.33* 0.33*  CALCIUM 8.5* 8.3*   No results for input(s): LABURIN in the last 72 hours. Results for orders placed or performed during the hospital encounter of 10/06/2019  Blood Culture (routine x 2)     Status: None (Preliminary result)   Collection Time: 10/08/2019  7:00 PM   Specimen: BLOOD RIGHT HAND  Result Value Ref Range Status   Specimen Description BLOOD RIGHT HAND  Final   Special Requests   Final    BOTTLES DRAWN AEROBIC AND ANAEROBIC Blood Culture results may not be optimal due to an inadequate volume of blood received in culture bottles   Culture NO GROWTH 4 DAYS  Final   Report Status PENDING  Incomplete  Blood Culture (routine x 2)     Status: None (Preliminary result)   Collection Time: 10/08/2019  7:17 PM   Specimen: BLOOD  Result Value Ref Range Status   Specimen Description BLOOD LEFT ANTECUBITAL  Final   Special Requests   Final    BOTTLES DRAWN AEROBIC AND ANAEROBIC Blood Culture adequate volume   Culture NO GROWTH 4 DAYS  Final   Report Status PENDING  Incomplete  SARS CORONAVIRUS 2 (TAT 6-24 HRS)     Status: None   Collection Time: 10/25/2019  8:14 PM  Result Value Ref Range Status   SARS Coronavirus 2 NEGATIVE NEGATIVE Final    Comment: (NOTE) SARS-CoV-2 target nucleic acids are NOT DETECTED. The SARS-CoV-2 RNA is generally detectable in upper and lower respiratory specimens during the acute phase of infection. Negative results do not preclude SARS-CoV-2 infection, do not rule out co-infections with other pathogens, and should not be used as the sole basis for treatment or other patient management decisions. Negative results must be combined with clinical observations, patient history, and epidemiological information. The expected result is Negative. Fact Sheet for Patients: SugarRoll.be Fact Sheet for Healthcare Providers: https://www.woods-mathews.com/ This test is not yet approved or cleared by the [place] FDA  and  has been authorized for detection and/or diagnosis of SARS-CoV-2 by FDA under an Emergency Use Authorization (EUA). This EUA will remain  in effect (meaning this test can be used) for the duration of the COVID-19 declaration under Section 56 4(b)(1) of the Act, 21 U.S.C. section 360bbb-3(b)(1), unless the authorization is terminated or revoked sooner. Performed at [facility], [place] [address] [place], [place]  [address]   Urine culture     Status: Abnormal   Collection Time: 10/23/2019  9:23 PM   Specimen: In/Out Cath Urine  Result Value Ref Range Status   Specimen Description IN/OUT CATH URINE  Final   Special Requests   Final    NONE Performed at [facility], [place] [address] [place], [place] [address]    Culture 10,000 COLONIES/mL ENTEROCOCCUS FAECALIS (A)  Final   Report Status 10/23/2019 FINAL  Final   Organism ID, Bacteria ENTEROCOCCUS FAECALIS (A)  Final      Susceptibility   Enterococcus faecalis - MIC*    AMPICILLIN <=2 SENSITIVE Sensitive     NITROFURANTOIN <=16 SENSITIVE Sensitive     VANCOMYCIN 1 SENSITIVE Sensitive     * 10,000 COLONIES/mL ENTEROCOCCUS FAECALIS  Culture, respiratory (non-expectorated)     Status: None   Collection Time: 10/21/19  6:28 AM   Specimen: Tracheal Aspirate; Respiratory  Result Value Ref Range Status   Specimen Description TRACHEAL ASPIRATE  Final   Special Requests NONE  Final   Gram Stain   Final    RARE WBC PRESENT, PREDOMINANTLY MONONUCLEAR NO ORGANISMS SEEN Performed at [facility], [address], [place], [place] [address]    Culture RARE SERRATIA MARCESCENS  Final   Report Status 10/23/2019 FINAL  Final   Organism ID, Bacteria SERRATIA MARCESCENS  Final      Susceptibility   Serratia marcescens - MIC*    CEFAZOLIN >=64 RESISTANT Resistant     CEFEPIME <=0.12 SENSITIVE Sensitive     CEFTAZIDIME <=1 SENSITIVE Sensitive     CEFTRIAXONE <=0.25 SENSITIVE Sensitive     CIPROFLOXACIN <=0.25 SENSITIVE  Sensitive     GENTAMICIN <=1 SENSITIVE Sensitive     TRIMETH/SULFA <=20 SENSITIVE Sensitive     * RARE SERRATIA MARCESCENS  MRSA PCR Screening     Status: None   Collection Time: 10/21/19  3:43 PM   Specimen: Nasal Mucosa; Nasopharyngeal  Result Value Ref Range Status   MRSA by PCR NEGATIVE NEGATIVE Final    Comment:        The GeneXpert MRSA Assay (FDA approved for NASAL specimens only), is one component of a comprehensive MRSA colonization surveillance program. It is not intended to diagnose MRSA infection nor to guide or monitor treatment for MRSA infections. Performed at [facility], [place] [address], [place], [place] [address]     Imaging: DG Chest 1 View  Result Date: 10/23/2019 CLINICAL DATA:  Shortness of breath, tracheostomy EXAM: CHEST  1 VIEW COMPARISON:  10/22/2019 FINDINGS: Single frontal view of the chest demonstrates stable tracheostomy tube. The cardiac silhouette is stable. Continued consolidation of the right lower lobe. No new airspace disease, effusion, or pneumothorax. No acute bony abnormalities. IMPRESSION: 1. Continued dense consolidation within the right lower lobe consistent with atelectasis or aspiration based on prior CT images. No significant change since prior exam. The Electronically Signed   By: Randa Ngo M.D.   On: 10/23/2019 19:19   ECHOCARDIOGRAM COMPLETE  Result Date: 10/23/2019    ECHOCARDIOGRAM REPORT   Patient Name:   Brady [name] Date of Exam: 10/23/2019 Medical Rec #:  [id number]      Height:       68.0 in Accession #:    [id number]     Weight:       288.0 lb Date of Birth:  [birth year]      BSA:  2.386 m Patient Age:    68 years       BP:           107/66 mmHg Patient Gender: M              HR:           88 bpm. Exam Location:  Inpatient Procedure: 2D Echo, Color Doppler and Cardiac Doppler Indications:    I48.91* Unspecified atrial fibrillation  History:        Patient has no prior history of Echocardiogram examinations.                  Arrythmias:Atrial Fibrillation.  Sonographer:    Raquel Sarna Senior RDCS Referring Phys: [id number] ERIK C HOFFMAN  Sonographer Comments: Echo performed with patient supine and on artificial respirator. IMPRESSIONS  1. Left ventricular ejection fraction, by estimation, is 55 to 60%. The left ventricle has normal function. The left ventricle has no regional wall motion abnormalities. There is moderate left ventricular hypertrophy. Left ventricular diastolic function  could not be evaluated. Left ventricular diastolic function could not be evaluated.  2. Right ventricular systolic function is normal. The right ventricular size is normal.  3. The mitral valve is grossly normal. Trivial mitral valve regurgitation.  4. The aortic valve is tricuspid. Aortic valve regurgitation is not visualized. Mild aortic valve sclerosis is present, with no evidence of aortic valve stenosis. FINDINGS  Left Ventricle: Left ventricular ejection fraction, by estimation, is 55 to 60%. The left ventricle has normal function. The left ventricle has no regional wall motion abnormalities. The left ventricular internal cavity size was normal in size. There is  moderate left ventricular hypertrophy. Left ventricular diastolic function could not be evaluated due to atrial fibrillation. Left ventricular diastolic function could not be evaluated. Right Ventricle: The right ventricular size is normal. No increase in right ventricular wall thickness. Right ventricular systolic function is normal. Left Atrium: Left atrial size was normal in size. Right Atrium: Right atrial size was normal in size. Pericardium: There is no evidence of pericardial effusion. Mitral Valve: The mitral valve is grossly normal. Trivial mitral valve regurgitation. Tricuspid Valve: The tricuspid valve is grossly normal. Tricuspid valve regurgitation is trivial. Aortic Valve: The aortic valve is tricuspid. Aortic valve regurgitation is not visualized. Mild aortic valve sclerosis is  present, with no evidence of aortic valve stenosis. Pulmonic Valve: The pulmonic valve was normal in structure. Pulmonic valve regurgitation is not visualized. Aorta: The aortic root and ascending aorta are structurally normal, with no evidence of dilitation. Venous: IVC assessment for right atrial pressure unable to be performed due to mechanical ventilation. IAS/Shunts: No atrial level shunt detected by color flow Doppler.  LEFT VENTRICLE PLAX 2D LVIDd:         4.60 cm LVIDs:         3.20 cm LV PW:         1.30 cm LV IVS:        1.20 cm LVOT diam:     1.80 cm LV SV:         46 LV SV Index:   19 LVOT Area:     2.54 cm  RIGHT VENTRICLE RV S prime:     11.70 cm/s TAPSE (M-mode): 2.4 cm LEFT ATRIUM             Index       RIGHT ATRIUM           Index LA diam:  3.00 cm 1.26 cm/m  RA Area:     17.30 cm LA Vol (A2C):   42.5 ml 17.81 ml/m RA Volume:   43.60 ml  18.27 ml/m LA Vol (A4C):   33.3 ml 13.95 ml/m LA Biplane Vol: 39.4 ml 16.51 ml/m  AORTIC VALVE LVOT Vmax:   95.30 cm/s LVOT Vmean:  66.100 cm/s LVOT VTI:    0.181 m  AORTA Ao Root diam: 3.20 cm  SHUNTS Systemic VTI:  0.18 m Systemic Diam: 1.80 cm Lyman Bishop MD Electronically signed by Lyman Bishop MD Signature Date/Time: 10/23/2019/2:14:16 PM    Final

## 2019-10-24 NOTE — TOC Progression Note (Signed)
Transition of Care [facility]) - Progression Note    Patient Details  Name: Brady [name] MRN: [id number] Date of Birth: [birth year]  Transition of [facility]) CM/SW Contact  Levada Schilling Phone Number: 10/24/2019, 2:22 PM  Clinical Narrative:    CSW spoke with pt's spouse by phone.  Pt's spouse will return phone to discuss SNF options for pt.  CSW will continue to follow for disposition planning.        Expected Discharge Plan and Services                                                 Social Determinants of Health (SDOH) Interventions    Readmission Risk Interventions No flowsheet data found.

## 2019-10-24 NOTE — Progress Notes (Signed)
RT note-Attempted ATC with patient, very anxious but also minimal air movement. Patient was then returned to ventilator with PSV +14, remains very anxious but calming, tolerating PSV at this time, continue to monitor.

## 2019-10-24 NOTE — Consult Note (Addendum)
Cardiology Consultation:   Patient ID: Brady [name] MRN: [id number]; DOB: [birth year]  Admit date: 10/04/2019 Date of Consult: 10/24/2019  Primary Care Provider: Aaron Edelman, MD Primary Cardiologist: New   Patient Profile:   Brady [name] is a 64 y.o. 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 Bryna Colander.   Records reviewed from [place]. No documented heart disease. Echo 06/2018 LVEF of 55-60%, grade 1 DD, trace MR and trace TR.   History of Present Illness:   [name] admitted from [facility] for atrial fibrillation RVR, RLL collapse and ileus. He was initially admitted at [facility] in February 2021 for Pseudomonas/Serratia pneumonia and required tracheostomy. Patient developed ileus with abdominal distention then became hypotensive with afib RVR. Started on Levophed and brought to [facility] ER. His BP improved on IVF and rate improved on IV metoprolol. Work up reveled partial bowel obstruction. Seen by general surgery and NG tube removed on 3/21. Respiratory status improved after bronchoscopy and lavage on 3/20. Felt likely RLL collapse due to mucus plugging and aspiration event. On broad spectrum antibiotics.   Patient has received IV metoprolol initially on admit. He has intermittent relatively rate controled atrial fibrillation. Currently in sinus rhythm. Being supplemented for significant electrolytes abnormality.   Echo yesterday showed 1. Left ventricular ejection fraction, by estimation, is 55 to 60%. The  left ventricle has normal function. The left ventricle has no regional  wall motion abnormalities. There is moderate left ventricular hypertrophy.  Left ventricular diastolic function  could not be evaluated.  Left ventricular diastolic function could not be  evaluated.  2. Right ventricular systolic function is normal. The right ventricular  size is normal.  3. The mitral valve is grossly normal. Trivial mitral valve  regurgitation.  4. The aortic valve is tricuspid. Aortic valve regurgitation is not  visualized. Mild aortic valve sclerosis is present, with no evidence of  aortic valve stenosis.   Past Medical History:  Diagnosis Date  . Anxiety   . Chronic respiratory failure ([place])   . COPD (chronic obstructive pulmonary disease) ([place])   . Depression   . Hypertension   . Ileus ([place])   . Pseudomonas pneumonia ([place])   . Tobacco smoking affecting pregnancy   . Ventilator dependent ([place])    As summarized above  Inpatient Medications: Scheduled Meds: . atorvastatin  20 mg Per Tube Daily  . busPIRone  5 mg Per Tube TID  . chlorhexidine gluconate (MEDLINE KIT)  15 mL Mouth Rinse BID  . Chlorhexidine Gluconate Cloth  6 each Topical Daily  . feeding supplement (PRO-STAT SUGAR FREE 64)  30 mL Per Tube BID  . [START ON 10/25/2019] feeding supplement (VITAL HIGH PROTEIN)  1,000 mL Per Tube Q24H  . mouth rinse  15 mL Mouth Rinse 10 times per day  . metoCLOPramide (REGLAN) injection  10 mg Intravenous Q6H  . QUEtiapine  25 mg Per Tube BID  . sorbitol, milk of mag, mineral oil, glycerin (SMOG) enema  960 mL Rectal BID   Continuous Infusions: . ceFEPime (MAXIPIME) IV 2 g (10/24/19 1415)   PRN Meds: acetaminophen, docusate  Allergies:   Not on File  Social History:   Social History   Socioeconomic History  . Marital status: Married    Spouse name: Not on file  .  Number of children: Not on file  . Years of education: Not on file  . Highest education level: Not on file  Occupational History  . Not on file  Tobacco Use  . Smoking status: Not on file  Substance and Sexual Activity  . Alcohol use: Not on file  . Drug use: Not on file  . Sexual activity: Not on file  Other Topics  Concern  . Not on file  Social History Narrative  . Not on file   Social Determinants of Health   Financial Resource Strain:   . Difficulty of Paying Living Expenses:   Food Insecurity:   . Worried About Charity fundraiser in the Last Year:   . Arboriculturist in the Last Year:   Transportation Needs:   . Film/video editor (Medical):   Marland Kitchen Lack of Transportation (Non-Medical):   Physical Activity:   . Days of Exercise per Week:   . Minutes of Exercise per Session:   Stress:   . Feeling of Stress :   Social Connections:   . Frequency of Communication with Friends and Family:   . Frequency of Social Gatherings with Friends and Family:   . Attends Religious Services:   . Active Member of Clubs or Organizations:   . Attends Archivist Meetings:   Marland Kitchen Marital Status:   Intimate Partner Violence:   . Fear of Current or Ex-Partner:   . Emotionally Abused:   Marland Kitchen Physically Abused:   . Sexually Abused:     Family History:    Family History  Problem Relation Age of Onset  . Lung cancer Father   . Hypertension Father   . CAD Father   . Hypertension Mother   . CAD Mother      ROS:  Please see the history of present illness.  All other ROS reviewed and negative.     Physical Exam/Data:   Vitals:   10/24/19 1000 10/24/19 1115 10/24/19 1122 10/24/19 1519  BP: (!) 115/57     Pulse: 99     Resp: 15     Temp:  97.9 F (36.6 C)  98.6 F (37 C)  TempSrc:      SpO2: 95%  96%   Weight:      Height:        Intake/Output Summary (Last 24 hours) at 10/24/2019 1533 Last data filed at 10/24/2019 0900 Gross per 24 hour  Intake 511.41 ml  Output 850 ml  Net -338.59 ml   Last 3 Weights 10/24/2019 10/22/2019 10/21/2019  Weight (lbs) 296 lb 288 lb 227 lb 1.2 oz  Weight (kg) 134.265 kg 130.636 kg 103 kg     Body mass index is 45.01 kg/m.  General:  Chronically ill appearing male in no acute distress HEENT: Trach Lymph: no adenopathy Neck: no JVD Endocrine:  No  thryomegaly Vascular: No carotid bruits; FA pulses 2+ bilaterally without bruits  Cardiac:  normal S1, S2; regular tachycardia; no murmur  Lungs:  clear to auscultation bilaterally, no wheezing, rhonchi or rales  Abd: soft, nontender, no hepatomegaly  Ext: no edema Musculoskeletal:  No deformities, BUE and BLE strength normal and equal Skin: warm and dry  Neuro:   no focal abnormalities noted Psych:  Normal affect   EKG:  The EKG was personally reviewed and demonstrates:  Atrial fibrillation at rate of 115 bpm Telemetry:  Telemetry was personally reviewed and demonstrates:  Sinus rhythm with intermittent afib  Relevant CV Studies:  Echo 06/2018 Procedure: A complete two-dimensional transthoracic echocardiogram was performed (2D, M-mode, spectral and color flow Doppler).  Left Ventricle: The left ventricle is normal in size. Ejection Fraction = 55-60%. The left ventricular wall motion is normal. Grade I diastolic dysfunction, (abnormal relaxation pattern).  Left Atrium: The left atrial size is normal.  Right Atrium: Right atrial size is normal.  Right Ventricle: The right ventricle is normal in size and function.  Aortic Valve: The aortic valve is not well visualized. Focal calcification. The aortic valve opens well. No aortic regurgitation is present.  Mitral Valve: The mitral valve is normal in structure and function. There is trace mitral regurgitation.  Tricuspid Valve: The tricuspid valve is normal in structure and function. There is trace tricuspid regurgitation.  Pulmonic Valve: The pulmonic valve is not well visualized. No pulmonic valvular regurgitation.  Arteries: The aortic root is normal size.  Venous: The inferior vena cava is normal in size, with a normal collapsibility index.  Pericardium/Pleura: There is no pericardial effusion.  MMode 2D Measurements & Calculations RVDd - 2.6 cm IVSd - 1.1 cm LVIDd - 5.3 cm LVIDs - 4 cm LVPWd - 1  cm IVS/LVPW - 1.1  FS - 25.7 % EDV(Teich) - 138.2 ml ESV(Teich) - 68.9 ml EF(Teich) - 50.1 % EF (est.) - 59.5 % EDV(cubed) - 152.9 ml ESV(cubed) - 62.8 ml EF(cubed) - 59 % LV mass(C)d - 217.8 grams LV mass(C)dI - 92.2 grams/m^2 SV(Teich) - 69.2 ml SI(Teich) - 29.3 ml/m^2 SV(cubed) - 90.1 ml SI(cubed) - 38.2 ml/m^2 Ao root diam - 3.2 cm Ao root area - 8.2 cm^2 LA dimension - 3.3 cm desc Ao Diam - 2 cm LA/Ao - 1  LVOT diam - 2.1 cm LVOT area - 3.5 cm^2 LVOT area(traced) - 3.5 cm^2 LVLd ap4 - 8.4 cm EDV(MOD-sp4) - 123 ml LVLs ap4 - 7.1 cm ESV(MOD-sp4) - 48.4 ml EF(MOD-sp4) - 60.7 % LVLd ap2 - 8.2 cm EDV(MOD-sp2) - 98.5 ml LVLs ap2 - 6.7 cm ESV(MOD-sp2) - 40.4 ml EF(MOD-sp2) - 59 % SV(MOD-sp4) - 74.6 ml SI(MOD-sp4) - 31.6 ml/m^2 SV(MOD-sp2) - 58.1 ml SI(MOD-sp2) - 24.6 ml/m^2  Doppler Measurements & Calculations MV E max vel - 80.5 cm/sec MV A max vel - 110.1 cm/sec MV E/A - 0.73  MV dec time - 0.23 sec Ao V2 max - 178.1 cm/sec Ao max PG - 12.7 mmHg Ao max PG (full) - 6.8 mmHg Ao V2 mean - 129 cm/sec Ao mean PG - 7.6 mmHg Ao mean PG (full) - 3.8 mmHg Ao V2 VTI - 40.9 cm AVA(I,A) - 2.4 cm^2 AVA(I,D) - 2.4 cm^2 AVA(V,A) - 2.4 cm^2 AVA(V,D) - 2.4 cm^2 LVOT max gradient - 5.9 mmHg LV V1 mean PG - 3.8 mmHg LVOT max vel - 121.3 cm/sec LV V1 mean - 92.9 cm/sec LV V1 VTI - 28.8 cm MR max vel - 88 cm/sec MR max PG - 3.1 mmHg SV(Ao) - 333.8 ml SI(Ao) - 141.3 ml/m^2 SV(LVOT) - 100 ml SI(LVOT) - 42.3 ml/m^2 TV V2 max - 86.3 cm/sec TV max PG - 3 mmHg PA V2 max - 80 cm/sec PA max PG - 2.6 mmHg PA V2 mean - 60.9 cm/sec PA mean PG - 1.6 mmHg PA V2 VTI - 20.6 cm Pulm Sys Vel - 60.8 cm/sec Pulm Dias Vel - 50 cm/sec Pulm S/D - 1.2   Conclusion The left ventricle is normal in size. Ejection Fraction = 55-60%. Grade I diastolic dysfunction, (abnormal relaxation pattern). The right ventricle is normal in  size and function. There is trace mitral  regurgitation. There is trace tricuspid regurgitation.  InterpretingPhysician:Interpreting Physician:  Gerre Couch, MD  electronically signed on 2018-06-08 [id number]  Laboratory Data:  High Sensitivity Troponin:   Recent Labs  Lab 10/06/2019 2235 10/21/19 0018  TROPONINIHS 20* 18*     Chemistry Recent Labs  Lab 10/22/19 1803 10/23/19 0710 10/24/19 1247  NA 132* 134* 135  K 3.2* 3.1* 3.1*  CL 95* 98 100  CO2 27 25 23   GLUCOSE 102* 98 83  BUN 13 9 6*  CREATININE 0.33* 0.33* <0.30*  CALCIUM 8.5* 8.3* 8.5*  GFRNONAA >60 >60 NOT CALCULATED  GFRAA >60 >60 NOT CALCULATED  ANIONGAP 10 11 12     Recent Labs  Lab 10/27/2019 1916 10/22/19 0048  PROT 7.7 6.5  ALBUMIN 2.9* 2.5*  AST 27 20  ALT 29 26  ALKPHOS 126 107  BILITOT 1.6* 1.7*   Hematology Recent Labs  Lab 10/27/2019 1916 10/21/19 0519 10/22/19 0048  WBC 10.3  --  13.6*  RBC 4.69  --  4.26  HGB 14.6 13.3 13.4  HCT 43.5 39.0 39.7  MCV 92.8  --  93.2  MCH 31.1  --  31.5  MCHC 33.6  --  33.8  RDW 13.0  --  12.8  PLT 359  --  266   BNP Recent Labs  Lab 10/30/2019 2235  BNP 403.0*    Radiology/Studies:  CT ABDOMEN PELVIS WO CONTRAST  Result Date: 10/29/2019 CLINICAL DATA:  Nausea vomiting EXAM: CT ABDOMEN AND PELVIS WITHOUT CONTRAST TECHNIQUE: Multidetector CT imaging of the abdomen and pelvis was performed following the standard protocol without IV contrast. COMPARISON:  Chest x-ray 10/04/2019 FINDINGS: Lower chest: Lung bases demonstrate consolidation within the right lower lobe. Normal heart size. Coronary vascular calcification Hepatobiliary: No focal hepatic abnormality. Slightly distended gallbladder with layering stones or sludge. No biliary dilatation Pancreas: Atrophic.  No inflammatory change Spleen: Normal in size without focal abnormality. Adrenals/Urinary Tract: Adrenal glands are normal. Negative for hydronephrosis. The bladder is unremarkable Stomach/Bowel: Esophageal tube tip at the  proximal duodenum. Gastrostomy tube in the distal stomach. Mildly dilated fluid-filled small bowel with gradual transition to decompressed distal small bowel and terminal ileum in the right lower quadrant of the abdomen, series 3, image number 44. No bowel wall thickening. Moderate stool in the colon. Negative appendix. Vascular/Lymphatic: Extensive aortic atherosclerosis. No aneurysm. No significant adenopathy Reproductive: Prostate is unremarkable. Other: Negative for free air or free fluid. Musculoskeletal: Fixating screw at L5. Chronic appearing deformity at L3 with posterior wedging of the vertebral body. Advanced degenerative changes throughout the lumbar spine. Ghost tracks within the pedicles and vertebra from L1 to S1 consistent with prior surgical hardware and posterior decompression. Probable scar tissue within the posterior spinal region. Suspected 2 by 3.9 cm fluid collection within the posterior paraspinal soft tissues. IMPRESSION: 1. Consolidation in the right lower lobe which may be secondary to atelectasis, pneumonia, or aspiration. Central obstructing process is also possible as this is incompletely visualized. Consider dedicated chest CT for further evaluation. 2. Mildly dilated fluid-filled loops of proximal and mid small bowel with transition to decompressed distal and terminal ileum suggestive of low-grade bowel obstruction, less likely ileus. 3. Evidence of prior lumbar spine surgery and hardware removal. Soft tissue thickening within the posterior paraspinal soft tissues presumably due to postsurgical change; there is a 2 x 3.9 cm nonspecific fluid collection centrally within the soft tissue changes 4. Gallstones and or sludge. Electronically Signed   By:  Donavan Foil M.D.   On: 10/05/2019 20:31   DG Chest 1 View  Result Date: 10/23/2019 CLINICAL DATA:  Shortness of breath, tracheostomy EXAM: CHEST  1 VIEW COMPARISON:  10/22/2019 FINDINGS: Single frontal view of the chest demonstrates  stable tracheostomy tube. The cardiac silhouette is stable. Continued consolidation of the right lower lobe. No new airspace disease, effusion, or pneumothorax. No acute bony abnormalities. IMPRESSION: 1. Continued dense consolidation within the right lower lobe consistent with atelectasis or aspiration based on prior CT images. No significant change since prior exam. The Electronically Signed   By: Randa Ngo M.D.   On: 10/23/2019 19:19   CT Chest Wo Contrast  Result Date: 11/01/2019 CLINICAL DATA:  Shortness of breath.  Respiratory failure. EXAM: CT CHEST WITHOUT CONTRAST TECHNIQUE: Multidetector CT imaging of the chest was performed following the standard protocol without IV contrast. COMPARISON:  None. FINDINGS: Cardiovascular: The heart size is normal. Aortic calcifications are noted without evidence for thoracic aortic aneurysm. Advanced coronary artery calcifications are noted. There is no significant pericardial effusion. Mediastinum/Nodes: --No mediastinal or hilar lymphadenopathy. --No axillary lymphadenopathy. --No supraclavicular lymphadenopathy. --Normal thyroid gland. --there is an enteric tube that extends through the patient's esophagus and terminates in the region of the gastric pylorus. Lungs/Pleura: There is a tracheostomy tube in place that terminates above the carina. There is no pneumothorax. There is some atelectasis at the left lung base. There is essentially complete collapse of the right lower lobe which appears to be secondary to extensive endobronchial debris and plugging of the right lower lobe bronchus. There is some attenuation of the right middle lobe bronchus with persistent adequate aeration of the the right middle lobe. There is mild consolidation in the posterior segments of the right upper lobe, likely representing aspiration. Upper Abdomen: There is cholelithiasis with questionable gallbladder wall thickening. A gastrostomy tube is noted. Musculoskeletal: There is an old  healed proximal right humerus fracture. There are advanced degenerative changes of the left glenohumeral joint. Review of the MIP images confirms the above findings. IMPRESSION: 1. Complete collapse of the right lower lobe secondary to aspirated debris within the right lower lobe bronchus. 2. Consolidation in the posterior segments of the right upper lobe concerning for aspiration. 3. Cholelithiasis with questionable gallbladder wall thickening. Correlation with ultrasound is recommended. 4.  Aortic Atherosclerosis (ICD10-I70.0). Electronically Signed   By: Constance Holster M.D.   On: 10/14/2019 22:45   DG CHEST PORT 1 VIEW  Result Date: 10/22/2019 CLINICAL DATA:  Pneumonia EXAM: PORTABLE CHEST 1 VIEW COMPARISON:  10/21/2019 FINDINGS: Tracheostomy tube, unchanged. Interval removal of enteric tube. Stable cardiomediastinal contours. Persistent right basilar opacity compatible with right lower lobe collapse. Slightly improved aeration of the right upper lobe. Left lung is clear. No pneumothorax. IMPRESSION: Persistent right lower lobe collapse. Slightly improved aeration of the right upper lobe. Left lung remains clear. Electronically Signed   By: Davina Poke D.O.   On: 10/22/2019 12:50   DG CHEST PORT 1 VIEW  Result Date: 10/21/2019 CLINICAL DATA:  Status post bronchoscopy. EXAM: PORTABLE CHEST 1 VIEW COMPARISON:  CT chest 10/31/2019, chest radiograph 10/05/2019 FINDINGS: Unchanged position of a tracheostomy tube. An enteric tube passes below the level of the left hemidiaphragm with tip excluded from the field of view. Overlying cardiac monitoring leads. Cardiomediastinal silhouette unchanged. Persistent opacity within the right mid to lower lung field which may reflect a combination of previously demonstrated right lower lobe collapse and right upper lobe consolidation. The left lung is  clear. No evidence of pneumothorax. IMPRESSION: Persistent opacity throughout the right mid to lower lung field  which may reflect combination of previously demonstrated right lower lobe collapse and right upper lobe consolidation. Support apparatus as described. Electronically Signed   By: Kellie Simmering DO   On: 10/21/2019 07:10   DG Chest Port 1 View  Result Date: 10/24/2019 CLINICAL DATA:  Hypotension EXAM: PORTABLE CHEST 1 VIEW COMPARISON:  None. FINDINGS: The tracheostomy tube terminates above the carina. The tip of the enteric tube is not fully visualized on this study. The patient is rotated which significantly limits evaluation. There is suggestion of a right perihilar and right lower lung zone airspace opacity. There is no pneumothorax. There is mild vascular congestion. The cardiac silhouette appears to be at least mildly enlarged. Advanced degenerative changes are noted of the glenohumeral joints. IMPRESSION: 1. Limited study secondary to patient positioning. 2. Lines and tubes as above. The tip of the enteric tube is not reliably identified on this study. 3. Possible right lower lobe and right perihilar airspace opacities. These would be better evaluated with a repeat radiograph with improved patient positioning. Hypotension Electronically Signed   By: Constance Holster M.D.   On: 10/28/2019 19:20   DG Abd Portable 1V-Small Bowel Obstruction Protocol-24 hr delay  Result Date: 10/22/2019 CLINICAL DATA:  Small bowel obstruction.  24 hour delay. EXAM: PORTABLE ABDOMEN - 1 VIEW COMPARISON:  October 21, 2019 FINDINGS: There is now contrast in the colon to the level the sigmoid colon. Contrast persists in the left-sided loop of small bowel measuring 3.4 cm. A few other mildly prominent air-filled loops of small bowel are identified. No other acute interval changes. IMPRESSION: 1. Contrast is now seen in the colon to the level the sigmoid colon. Persistent mildly dilated loops of small bowel are seen, particularly in the left abdomen, worrisome for partial small bowel obstruction. Electronically Signed   By: Dorise Bullion III M.D   On: 10/22/2019 07:22   DG Abd Portable 1V-Small Bowel Obstruction Protocol-initial, 8 hr delay  Result Date: 10/21/2019 CLINICAL DATA:  Bowel obstruction. EXAM: PORTABLE ABDOMEN - 1 VIEW COMPARISON:  CT dated October 20, 2019 FINDINGS: The enteric tube projects over the gastric pylorus. There is a small amount of oral contrast remaining in the stomach. The majority of the patient's oral contrast remains within the small bowel with a small amount of oral contrast likely residing within the colon. There are persistent dilated loops of small bowel measuring up to approximately 4.7 cm. IMPRESSION: 1. The majority of the oral contrast remains within the small bowel. There is likely a small amount of oral contrast within the colon. 2. Persistent dilated loops of small bowel. 3. Enteric tube as above. Electronically Signed   By: Constance Holster M.D.   On: 10/21/2019 18:18   ECHOCARDIOGRAM COMPLETE  Result Date: 10/23/2019    ECHOCARDIOGRAM REPORT   Patient Name:   Brady [name] Date of Exam: 10/23/2019 Medical Rec #:  [id number]      Height:       68.0 in Accession #:    [id number]     Weight:       288.0 lb Date of Birth:  November 13, 1955      BSA:          2.386 m Patient Age:    26 years       BP:           107/66 mmHg Patient Gender: M  HR:           88 bpm. Exam Location:  Inpatient Procedure: 2D Echo, Color Doppler and Cardiac Doppler Indications:    I48.91* Unspecified atrial fibrillation  History:        Patient has no prior history of Echocardiogram examinations.                 Arrythmias:Atrial Fibrillation.  Sonographer:    Raquel Sarna Senior RDCS Referring Phys: [id number] ERIK C HOFFMAN  Sonographer Comments: Echo performed with patient supine and on artificial respirator. IMPRESSIONS  1. Left ventricular ejection fraction, by estimation, is 55 to 60%. The left ventricle has normal function. The left ventricle has no regional wall motion abnormalities. There is moderate left ventricular  hypertrophy. Left ventricular diastolic function  could not be evaluated. Left ventricular diastolic function could not be evaluated.  2. Right ventricular systolic function is normal. The right ventricular size is normal.  3. The mitral valve is grossly normal. Trivial mitral valve regurgitation.  4. The aortic valve is tricuspid. Aortic valve regurgitation is not visualized. Mild aortic valve sclerosis is present, with no evidence of aortic valve stenosis. FINDINGS  Left Ventricle: Left ventricular ejection fraction, by estimation, is 55 to 60%. The left ventricle has normal function. The left ventricle has no regional wall motion abnormalities. The left ventricular internal cavity size was normal in size. There is  moderate left ventricular hypertrophy. Left ventricular diastolic function could not be evaluated due to atrial fibrillation. Left ventricular diastolic function could not be evaluated. Right Ventricle: The right ventricular size is normal. No increase in right ventricular wall thickness. Right ventricular systolic function is normal. Left Atrium: Left atrial size was normal in size. Right Atrium: Right atrial size was normal in size. Pericardium: There is no evidence of pericardial effusion. Mitral Valve: The mitral valve is grossly normal. Trivial mitral valve regurgitation. Tricuspid Valve: The tricuspid valve is grossly normal. Tricuspid valve regurgitation is trivial. Aortic Valve: The aortic valve is tricuspid. Aortic valve regurgitation is not visualized. Mild aortic valve sclerosis is present, with no evidence of aortic valve stenosis. Pulmonic Valve: The pulmonic valve was normal in structure. Pulmonic valve regurgitation is not visualized. Aorta: The aortic root and ascending aorta are structurally normal, with no evidence of dilitation. Venous: IVC assessment for right atrial pressure unable to be performed due to mechanical ventilation. IAS/Shunts: No atrial level shunt detected by color  flow Doppler.  LEFT VENTRICLE PLAX 2D LVIDd:         4.60 cm LVIDs:         3.20 cm LV PW:         1.30 cm LV IVS:        1.20 cm LVOT diam:     1.80 cm LV SV:         46 LV SV Index:   19 LVOT Area:     2.54 cm  RIGHT VENTRICLE RV S prime:     11.70 cm/s TAPSE (M-mode): 2.4 cm LEFT ATRIUM             Index       RIGHT ATRIUM           Index LA diam:        3.00 cm 1.26 cm/m  RA Area:     17.30 cm LA Vol (A2C):   42.5 ml 17.81 ml/m RA Volume:   43.60 ml  18.27 ml/m LA Vol (A4C):   33.3 ml 13.95 ml/m  LA Biplane Vol: 39.4 ml 16.51 ml/m  AORTIC VALVE LVOT Vmax:   95.30 cm/s LVOT Vmean:  66.100 cm/s LVOT VTI:    0.181 m  AORTA Ao Root diam: 3.20 cm  SHUNTS Systemic VTI:  0.18 m Systemic Diam: 1.80 cm Lyman Bishop MD Electronically signed by Lyman Bishop MD Signature Date/Time: 10/23/2019/2:14:16 PM    Final    { Assessment and Plan:   1. Paroxysmal atrial fibrillation - His heart rate was elevated upon arrival 120s to [facility] [facility], ER from [facility].  Heart rate improved after IV metoprolol.  Since being admitted he has intermittent atrial fibrillation at controlled ventricular rate.  Currently in sinus tachycardic at 100 bpm.  Mali vas score of 1 for hypertension.  Blood pressure labile.  - Echo showed preserved LVEF, no significant valvular abnormality.  - Will plan to add BB eventually when stable BP.   2.  Vent dependent chronic respiratory failure/Serratia Pneumonia/ Collapsed RLL -Improved respiratory status after bronchoscopy and Lavage on 3/20  3. Ileus/ partial bowel obstruction - passing flatus - On Bowel regimen   4.  Hypokalemia/hypomagnesemia -On supplement - Keep potassium > 4 and magnesium at 2   Dr. Percival Spanish to see later today   For questions or updates, please contact [facility] Please consult [email or web address] for contact info under     Jarrett Soho, PA  10/24/2019 3:33 PM   History and all data above reviewed.  Patient examined.  I agree with  the findings as above.  The patient has an unfortunate history as above.  In a chronic vent facility post pneumonia.  It does not appear that there is a significant cardiac history associated with this but we do not have the recent records.  We will see significant cardiac history in care everywhere.  He was brought down to the emergency room with ileus.  He was hypotensive.  He has been in atrial fibrillation.  Rate currently is well controlled.  There is an echocardiogram demonstrates normal left ventricular function.  Is difficult for him to communicate if that he does not want to try to right because he is having hand weakness.  He indicates that he is not feeling any pain in his chest or palpitations.  He is not having any presyncope or syncope.  He thinks he is breathing okay through his trach.  He indicates that he is having some right pain at the dorsum of his foot.  He is profoundly weak moves his lower extremities minimally in his right extremity slowly.  He is left dominant this relatively well.    EKG demonstrates no acute changes.  The patient exam reveals SRP:[id number]  ,  Lungs: Decreased breath sounds without crackles  ,  Abd: Positive bowel sounds, no rebound no guarding, Ext diffuse edema  .  All available labs, radiology testing, previous records reviewed. Agree with documented assessment and plan.   Atrial fib: Right now his blood pressure is very labile.  I do not start IV medications but likely will start a low-dose of oral beta-blockers once he gets a swallowing study.  His thromboembolic risk is very low so he really does not need systemic anticoagulation.  I do not see a role for rhythm control at this point.  Perhaps in the future we would consider this if he has persistent atrial fibrillation.  Need long-term anticoagulation prior.    Jeneen Rinks Jacquelinne Speak  3:47 PM  10/24/2019

## 2019-10-24 NOTE — Progress Notes (Signed)
   Subjective: Pt seen at the bedside this morning. Awake and alert. Denies abdominal pain or nausea. Trickle feeds stopped last evening as pt felt full. He is having anxiety which is causing him shortness of breath. Still endorsing passing flatus. Pt eager to resume feeds today.  Objective:  Vital signs in last 24 hours: Vitals:   10/24/19 0100 10/24/19 0200 10/24/19 0300 10/24/19 0333  BP: 99/65 (!) 86/47 (!) 89/59   Pulse: 80 (!) 52 (!) 57   Resp: 11 12 15    Temp:   (!) 97.5 F (36.4 C)   TempSrc:   Oral   SpO2: 100% 98% 100% 100%  Weight:      Height:       Physical Exam Vitals and nursing note reviewed.  Constitutional:      General: He is not in acute distress.    Comments: Chronically ill appearing man laying in bed.  Neck:     Comments: Trach in place. Cardiovascular:     Rate and Rhythm: Normal rate and regular rhythm.  Abdominal:     General: Abdomen is flat. Bowel sounds are normal. There is no distension.     Palpations: Abdomen is soft.  Neurological:     Mental Status: He is alert.    Assessment/Plan:  Active Problems:   Hypotension   Lactic acidosis   Small bowel obstruction (HCC)   VAP (ventilator-associated pneumonia) (HCC)   Acute on chronic respiratory failure (HCC)   S/P bronchoscopy  64yo male w PMH vent dependent respiratory failure, HLD, atypical depression, COPD, chronic low back pain, and new PEG tube placement 10/05/2019 presentingfrom [facility] with SBO and new onset atrial fibrillation.   Small Bowel Obstruction Hypokalemia  CT findings suggestive of low grade bowel obstruction.Repeat abdominal x-ray on 3/21 with contrast now in colon, persistent mildly dilated loops of small bowel seen in L abdomen (possible partial bowel obstruction). - continue aggressive bowel regimen - goal K > 4 and Mg > 2 - dietitian on board for tube feed goal - SLP to evaluate   General surgery consulted - removed NG on 3/21 - tolerated trickle feeds on  3/22 - call back as needed  Serratia Pneumonia Vent Dependent Respiratory Failure  Underwent bronchoscopy and lavageon 3/20with improvement in respiratory status. Respiratory culture with rare serratia marcescens. CXR on 3/21 with persistent RLL collapse, slight improved aeration of RUL, and clear left lung. - serratia sensitive to all antibiotics tested except cefazolin - narrow antibiotics to ceftriaxone today - awaiting fungus culture and AFB  PCCM consulted - RLL collapse likely mucous plugging and aspiration event - pt unable to tolerate extended trach collar yesterday due to fatigue - PSV ventilation - chest physiotherapy - trach collar if tolerating 2hr trial - PSV ventilation today, transition to trach collar trials if tolerates 2h  New Onset Atrial Fibrillationwith RVR Likely secondary to aspiration pneumonia and SBO. Given 5mg  IV metop x 1. HR remains controlled. - per tele review, pt with periods of a fib and others in sinus rhythm with PVCs - CHA2DS- VACs2 of 1 - TSH pending - echo with LVEF 55-60%, no wall motion abnormalities, moderate LVH, and normal RV function - Cardiology consulted today  Prior to Admission Living Arrangement: [facility] Anticipated Discharge Location: ?different LTAC Barriers to Discharge: TOC to see about placement, and continued clinical improvement Dispo: Anticipated discharge pending clinical course  4/21, MD 10/24/2019, 6:04 AM Pager: [phone]

## 2019-10-24 NOTE — Progress Notes (Signed)
Nutrition Follow-up  DOCUMENTATION CODES:   Morbid obesity  INTERVENTION:   Tube Feeding:  Resume TF at 20 ml/hr post enema; titrate by 10 mL q 8 hours until goal rate of 65 ml/hr Goal Regimen: Vital High Protein at 65 ml/hr with Pro-Stat 30 mL BID Provides 1760 kcals, 167 g of protein and 1310 mL of free water  Free water flush of 200 mL q 6 hours: total free water 2110 mL  Recommend checking phosphorus  NUTRITION DIAGNOSIS:   Inadequate oral intake related to inability to eat as evidenced by NPO status.  Being addressed via TF   GOAL:   Patient will meet greater than or equal to 90% of their needs  Progressing  MONITOR:   TF tolerance, Diet advancement, Labs, Weight trends, Skin  REASON FOR ASSESSMENT:   Consult Enteral/tube feeding initiation and management  ASSESSMENT:   64 yo male admitted with RLL collapse with aspiration and mucous plugging, ileus secondary to severe constipation. PMH includes spinal stenosis, hepatitis C, HTN, chronic respiratory failure with trach on vent support with PEG tube since 09/2019   Pt remains on vent support via trach, noted plan for ATC as tolerated  RN reporting pt is refusing TF as "it hurts his belly." Pt started on just trickle TF yesterday  Discussed with pt at bedside. TF currently on hold, pt refusing TF. Pt reports no abdominal pain or nausea at present time. Pt reports we were giving him "too much food" yesterday. Explained to patient that we started this TF at very low rate of 20 ml/hr yesterday (no titration) which is less than 1 ounce per hour. Pt stated he understood but stomach still did not feel good.   Per RN, plan is for SMOG enema today as pt with significant stool burden despite +flatus and stool. +large BM today. This could be contributing to pt's feeling of "fullness." Also noted addition of reglan q 6 hours x 2 days  Labs: sodium 134 (L), potassium 3.1 (L), magnesium 1.6 (L) Meds: 10 mg reglan q 6 hours x 2  days, SMOG enema  Diet Order:   Diet Order            Diet NPO time specified  Diet effective now              EDUCATION NEEDS:   Not appropriate for education at this time  Skin:  Skin Assessment: Skin Integrity Issues: Skin Integrity Issues:: Other (Comment) Other: MASD to bilateral buttock  Last BM:  3/23  Height:   Ht Readings from Last 1 Encounters:  Nov 16, 2019 5\' 8"  (1.727 m)    Weight:   Wt Readings from Last 1 Encounters:  10/24/19 134.3 kg    Ideal Body Weight:  70 kg  BMI:  Body mass index is 45.01 kg/m.  Estimated Nutritional Needs:   Kcal:  1550-1750 kcals  Protein:  140-175  Fluid:  >/= 2 L   10/26/19 MS, RDN, LDN, CNSC RD Pager Number and Weekend/On-Call After Hours Pager Located in [place]

## 2019-10-25 ENCOUNTER — Inpatient Hospital Stay (HOSPITAL_COMMUNITY): Payer: Medicare Other

## 2019-10-25 DIAGNOSIS — I480 Paroxysmal atrial fibrillation: Secondary | ICD-10-CM

## 2019-10-25 LAB — BASIC METABOLIC PANEL
Anion gap: 11 (ref 5–15)
Anion gap: 7 (ref 5–15)
BUN: 9 mg/dL (ref 8–23)
BUN: 9 mg/dL (ref 8–23)
CO2: 25 mmol/L (ref 22–32)
CO2: 25 mmol/L (ref 22–32)
Calcium: 8.8 mg/dL — ABNORMAL LOW (ref 8.9–10.3)
Calcium: 8.7 mg/dL — ABNORMAL LOW (ref 8.9–10.3)
Chloride: 105 mmol/L (ref 98–111)
Chloride: 109 mmol/L (ref 98–111)
Creatinine, Ser: 0.31 mg/dL — ABNORMAL LOW (ref 0.61–1.24)
Creatinine, Ser: 0.33 mg/dL — ABNORMAL LOW (ref 0.61–1.24)
GFR calc Af Amer: >60 mL/min (ref >60)
GFR calc Af Amer: >60 mL/min (ref >60)
GFR calc non Af Amer: >60 mL/min (ref >60)
GFR calc non Af Amer: >60 mL/min (ref >60)
Glucose, Bld: 94 mg/dL (ref 70–99)
Glucose, Bld: 108 mg/dL — ABNORMAL HIGH (ref 70–99)
Potassium: 2.7 mmol/L — CL (ref 3.5–5.1)
Potassium: 4.4 mmol/L (ref 3.5–5.1)
Sodium: 141 mmol/L (ref 135–145)
Sodium: 141 mmol/L (ref 135–145)

## 2019-10-25 LAB — ACID FAST SMEAR (AFB, MYCOBACTERIA): Acid Fast Smear: NEGATIVE

## 2019-10-25 LAB — GLUCOSE, CAPILLARY
Glucose-Capillary: 87 mg/dL (ref 70–99)
Glucose-Capillary: 93 mg/dL (ref 70–99)
Glucose-Capillary: 100 mg/dL — ABNORMAL HIGH (ref 70–99)
Glucose-Capillary: 96 mg/dL (ref 70–99)
Glucose-Capillary: 101 mg/dL — ABNORMAL HIGH (ref 70–99)

## 2019-10-25 LAB — CULTURE, BLOOD (ROUTINE X 2)
Culture: NO GROWTH
Culture: NO GROWTH
Special Requests: ADEQUATE

## 2019-10-25 LAB — MAGNESIUM: Magnesium: 1.7 mg/dL (ref 1.7–2.4)

## 2019-10-25 LAB — PHOSPHORUS: Phosphorus: 3.8 mg/dL (ref 2.5–4.6)

## 2019-10-25 MED ORDER — POTASSIUM CHLORIDE 20 MEQ/15ML (10%) PO SOLN
40.0000 meq | ORAL | Status: AC
  Administered 2019-10-25 (×2): 40 meq
  Filled 2019-10-25 (×2): qty 30

## 2019-10-25 MED ORDER — SENNOSIDES 8.8 MG/5ML PO SYRP
10.0000 mL | ORAL_SOLUTION | Freq: Every day | ORAL | Status: DC
  Administered 2019-10-25: 10 mL
  Filled 2019-10-25: qty 10

## 2019-10-25 MED ORDER — POTASSIUM CHLORIDE CRYS ER 20 MEQ PO TBCR: 40.0000 meq | EXTENDED_RELEASE_TABLET | Freq: Once | ORAL | Status: DC

## 2019-10-25 MED ORDER — HYDROCODONE-ACETAMINOPHEN 7.5-325 MG/15ML PO SOLN
10.0000 mL | Freq: Four times a day (QID) | ORAL | Status: DC | PRN
  Administered 2019-10-25 – 2019-11-17 (×76): 10 mL
  Filled 2019-10-25 (×77): qty 15

## 2019-10-25 MED ORDER — MAGNESIUM SULFATE 4 GM/100ML IV SOLN
4.0000 g | Freq: Once | INTRAVENOUS | Status: AC
  Administered 2019-10-25: 4 g via INTRAVENOUS
  Filled 2019-10-25: qty 100

## 2019-10-25 MED ORDER — HYDROCODONE-ACETAMINOPHEN 7.5-325 MG/15ML PO SOLN
10.0000 mL | Freq: Four times a day (QID) | ORAL | Status: DC | PRN
  Administered 2019-10-25: 10 mL via ORAL
  Filled 2019-10-25: qty 15

## 2019-10-25 MED ORDER — PANTOPRAZOLE SODIUM 40 MG IV SOLR
40.0000 mg | INTRAVENOUS | Status: DC
  Administered 2019-10-25 – 2019-10-30 (×6): 40 mg via INTRAVENOUS
  Filled 2019-10-25 (×6): qty 40

## 2019-10-25 MED ORDER — POLYETHYLENE GLYCOL 3350 17 G PO PACK: 17.0000 g | PACK | Freq: Every day | ORAL | Status: DC

## 2019-10-25 MED ORDER — ENOXAPARIN SODIUM 40 MG/0.4ML ~~LOC~~ SOLN
40.0000 mg | SUBCUTANEOUS | Status: DC
  Administered 2019-10-25: 40 mg via SUBCUTANEOUS
  Filled 2019-10-25: qty 0.4

## 2019-10-25 MED ORDER — METOPROLOL TARTRATE 12.5 MG HALF TABLET: 12.5000 mg | ORAL_TABLET | Freq: Two times a day (BID) | ORAL | Status: DC

## 2019-10-25 MED ORDER — POTASSIUM CHLORIDE 20 MEQ/15ML (10%) PO SOLN
40.0000 meq | ORAL | Status: DC
  Administered 2019-10-25: 40 meq
  Filled 2019-10-25: qty 30

## 2019-10-25 NOTE — TOC Progression Note (Signed)
Transition of Care [facility]) - Progression Note    Patient Details  Name: Brady [name] MRN: [id number] Date of Birth: 1955/09/20  [facility]) CM/SW Contact  Janae Bridgeman, RN Phone Number: 10/25/2019, 3:50 PM  Clinical Narrative:    Case management visited with the patient at the bedside after primary nurse resported that the patient was concerned and did not want to return to [facility] after discharge from the hospital.  Talked with the patient and the patient was left with the names of the two other skilled nursing facilities that offer services to patient's with trach/ventililator needs. Patient provided with names at the bedside for patient and spouse -  [facility] in [place] and [facility] in [place], [place].  All other facilities would be out of state.  CSW discussed this previously with the spouse.          Expected Discharge Plan and Services                                                 Social Determinants of Health (SDOH) Interventions    Readmission Risk Interventions No flowsheet data found.

## 2019-10-25 NOTE — Progress Notes (Signed)
Pt became very SOB with vest CPT after only 2 minutes. Tx was stopped and pt is more comfortable. Wean will be held at this time.

## 2019-10-25 NOTE — Progress Notes (Signed)
SLP Cancellation Note  Patient Details Name: Nafis Farnan MRN: [id number] DOB: [birth year]   Cancelled treatment:       Reason Eval/Treat Not Completed: Medical issues which prohibited therapy(Pt currently on vent with RT attempting to wean. SLP will follow up.)   Kemara Quigley I. Vear Clock, MS, CCC-SLP Acute Rehabilitation Services Office number [phone] Pager [phone]  Scheryl Marten 10/25/2019, 1:31 PM

## 2019-10-25 NOTE — Progress Notes (Signed)
Progress Note  Patient Name: Brady [name] Date of Encounter: 10/25/2019  Primary Cardiologist:   No primary care provider on file.   Subjective   Very difficult to understand as he cannot write and tries to mouth the answers but denies pain at this moment and is breathing OK.  Now in NSR  Inpatient Medications    Scheduled Meds: . atorvastatin  20 mg Per Tube Daily  . busPIRone  5 mg Per Tube TID  . chlorhexidine gluconate (MEDLINE KIT)  15 mL Mouth Rinse BID  . Chlorhexidine Gluconate Cloth  6 each Topical Daily  . feeding supplement (PRO-STAT SUGAR FREE 64)  30 mL Per Tube BID  . feeding supplement (VITAL HIGH PROTEIN)  1,000 mL Per Tube Q24H  . mouth rinse  15 mL Mouth Rinse 10 times per day  . metoCLOPramide (REGLAN) injection  10 mg Intravenous Q6H  . pantoprazole (PROTONIX) IV  40 mg Intravenous Q24H  . potassium chloride  40 mEq Per Tube Q4H  . QUEtiapine  25 mg Per Tube BID  . sorbitol, milk of mag, mineral oil, glycerin (SMOG) enema  960 mL Rectal BID   Continuous Infusions: . ceFEPime (MAXIPIME) IV 2 g (10/25/19 0542)  . magnesium sulfate bolus IVPB     PRN Meds: acetaminophen, docusate, HYDROcodone-acetaminophen   Vital Signs    Vitals:   10/25/19 0500 10/25/19 0600 10/25/19 0734 10/25/19 0739  BP: (!) 101/57 108/64    Pulse: 98 (!) 110 (!) 107   Resp: 15 (!) 23 (!) 24   Temp:    98.3 F (36.8 C)  TempSrc:    Oral  SpO2: 96% 91% 94%   Weight: 134.3 kg     Height:        Intake/Output Summary (Last 24 hours) at 10/25/2019 0926 Last data filed at 10/25/2019 0700 Gross per 24 hour  Intake 320 ml  Output 1050 ml  Net -730 ml   Filed Weights   10/22/19 0500 10/24/19 0500 10/25/19 0500  Weight: 130.6 kg 134.3 kg 134.3 kg    Telemetry    NSR with atrial ectopy - Personally Reviewed  ECG    NA - Personally Reviewed  Physical Exam   GEN: No acute distress.   Neck: No  JVD Cardiac: RRR, no murmurs, rubs, or gallops.  Respiratory:     Decreased breath sounds. GI: Soft, nontender, non-distended  MS: No  edema; No deformity. Neuro:  Nonfocal  Psych: Normal affect   Labs    Chemistry Recent Labs  Lab 10/30/2019 1916 10/21/19 0301 10/22/19 0048 10/22/19 1803 10/23/19 0710 10/24/19 1247 10/25/19 0211  NA 125*   < > 134*   < > 134* 135 141  K 3.2*   < > 3.4*   < > 3.1* 3.1* 2.7*  CL 80*   < > 95*   < > 98 100 105  CO2 29   < > 27   < > 25 23 25   GLUCOSE 53*   < > 97   < > 98 83 94  BUN 48*   < > 19   < > 9 6* 9  CREATININE 0.80   < > 0.32*   < > 0.33* <0.30* 0.31*  CALCIUM 8.9   < > 8.5*   < > 8.3* 8.5* 8.8*  PROT 7.7  --  6.5  --   --   --   --   ALBUMIN 2.9*  --  2.5*  --   --   --   --  AST 27  --  20  --   --   --   --   ALT 29  --  26  --   --   --   --   ALKPHOS 126  --  107  --   --   --   --   BILITOT 1.6*  --  1.7*  --   --   --   --   GFRNONAA >60   < > >60   < > >60 NOT CALCULATED >60  GFRAA >60   < > >60   < > >60 NOT CALCULATED >60  ANIONGAP 16*   < > 12   < > 11 12 11    < > = values in this interval not displayed.     Hematology Recent Labs  Lab 10/28/2019 1916 10/21/19 0519 10/22/19 0048  WBC 10.3  --  13.6*  RBC 4.69  --  4.26  HGB 14.6 13.3 13.4  HCT 43.5 39.0 39.7  MCV 92.8  --  93.2  MCH 31.1  --  31.5  MCHC 33.6  --  33.8  RDW 13.0  --  12.8  PLT 359  --  266    Cardiac EnzymesNo results for input(s): TROPONINI in the last 168 hours. No results for input(s): TROPIPOC in the last 168 hours.   BNP Recent Labs  Lab 10/16/2019 2235  BNP 403.0*     DDimer No results for input(s): DDIMER in the last 168 hours.   Radiology    DG Chest 1 View  Result Date: 10/23/2019 CLINICAL DATA:  Shortness of breath, tracheostomy EXAM: CHEST  1 VIEW COMPARISON:  10/22/2019 FINDINGS: Single frontal view of the chest demonstrates stable tracheostomy tube. The cardiac silhouette is stable. Continued consolidation of the right lower lobe. No new airspace disease, effusion, or pneumothorax. No  acute bony abnormalities. IMPRESSION: 1. Continued dense consolidation within the right lower lobe consistent with atelectasis or aspiration based on prior CT images. No significant change since prior exam. The Electronically Signed   By: Randa Ngo M.D.   On: 10/23/2019 19:19   DG Abd 1 View  Result Date: 10/25/2019 CLINICAL DATA:  Ileus EXAM: ABDOMEN - 1 VIEW COMPARISON:  October 22, 2019 FINDINGS: Contrast is no longer appreciable in bowel. There is no bowel dilatation or air-fluid level to suggest bowel obstruction. No free air evident on this supine examination. A screw is present at the level of the upper sacrum. There is extensive arthropathy throughout the lumbar spine. IMPRESSION: Bowel gas pattern unremarkable. No findings suggesting bowel obstruction. No evident free air. Electronically Signed   By: Lowella Grip III M.D.   On: 10/25/2019 07:57   ECHOCARDIOGRAM COMPLETE  Result Date: 10/23/2019    ECHOCARDIOGRAM REPORT   Patient Name:   Brady [name] Date of Exam: 10/23/2019 Medical Rec #:  [id number]      Height:       68.0 in Accession #:    [id number]     Weight:       288.0 lb Date of Birth:  1956-02-27      BSA:          2.386 m Patient Age:    64 years       BP:           107/66 mmHg Patient Gender: M              HR:  88 bpm. Exam Location:  Inpatient Procedure: 2D Echo, Color Doppler and Cardiac Doppler Indications:    I48.91* Unspecified atrial fibrillation  History:        Patient has no prior history of Echocardiogram examinations.                 Arrythmias:Atrial Fibrillation.  Sonographer:    Raquel Sarna Senior RDCS Referring Phys: [id number] ERIK C HOFFMAN  Sonographer Comments: Echo performed with patient supine and on artificial respirator. IMPRESSIONS  1. Left ventricular ejection fraction, by estimation, is 55 to 60%. The left ventricle has normal function. The left ventricle has no regional wall motion abnormalities. There is moderate left ventricular hypertrophy. Left  ventricular diastolic function  could not be evaluated. Left ventricular diastolic function could not be evaluated.  2. Right ventricular systolic function is normal. The right ventricular size is normal.  3. The mitral valve is grossly normal. Trivial mitral valve regurgitation.  4. The aortic valve is tricuspid. Aortic valve regurgitation is not visualized. Mild aortic valve sclerosis is present, with no evidence of aortic valve stenosis. FINDINGS  Left Ventricle: Left ventricular ejection fraction, by estimation, is 55 to 60%. The left ventricle has normal function. The left ventricle has no regional wall motion abnormalities. The left ventricular internal cavity size was normal in size. There is  moderate left ventricular hypertrophy. Left ventricular diastolic function could not be evaluated due to atrial fibrillation. Left ventricular diastolic function could not be evaluated. Right Ventricle: The right ventricular size is normal. No increase in right ventricular wall thickness. Right ventricular systolic function is normal. Left Atrium: Left atrial size was normal in size. Right Atrium: Right atrial size was normal in size. Pericardium: There is no evidence of pericardial effusion. Mitral Valve: The mitral valve is grossly normal. Trivial mitral valve regurgitation. Tricuspid Valve: The tricuspid valve is grossly normal. Tricuspid valve regurgitation is trivial. Aortic Valve: The aortic valve is tricuspid. Aortic valve regurgitation is not visualized. Mild aortic valve sclerosis is present, with no evidence of aortic valve stenosis. Pulmonic Valve: The pulmonic valve was normal in structure. Pulmonic valve regurgitation is not visualized. Aorta: The aortic root and ascending aorta are structurally normal, with no evidence of dilitation. Venous: IVC assessment for right atrial pressure unable to be performed due to mechanical ventilation. IAS/Shunts: No atrial level shunt detected by color flow Doppler.  LEFT  VENTRICLE PLAX 2D LVIDd:         4.60 cm LVIDs:         3.20 cm LV PW:         1.30 cm LV IVS:        1.20 cm LVOT diam:     1.80 cm LV SV:         46 LV SV Index:   19 LVOT Area:     2.54 cm  RIGHT VENTRICLE RV S prime:     11.70 cm/s TAPSE (M-mode): 2.4 cm LEFT ATRIUM             Index       RIGHT ATRIUM           Index LA diam:        3.00 cm 1.26 cm/m  RA Area:     17.30 cm LA Vol (A2C):   42.5 ml 17.81 ml/m RA Volume:   43.60 ml  18.27 ml/m LA Vol (A4C):   33.3 ml 13.95 ml/m LA Biplane Vol: 39.4 ml 16.51 ml/m  AORTIC VALVE LVOT  Vmax:   95.30 cm/s LVOT Vmean:  66.100 cm/s LVOT VTI:    0.181 m  AORTA Ao Root diam: 3.20 cm  SHUNTS Systemic VTI:  0.18 m Systemic Diam: 1.80 cm Lyman Bishop MD Electronically signed by Lyman Bishop MD Signature Date/Time: 10/23/2019/2:14:16 PM    Final     Cardiac Studies   ECHO:  1. Left ventricular ejection fraction, by estimation, is 55 to 60%. The  left ventricle has normal function. The left ventricle has no regional  wall motion abnormalities. There is moderate left ventricular hypertrophy.  Left ventricular diastolic function  could not be evaluated. Left ventricular diastolic function could not be  evaluated.  2. Right ventricular systolic function is normal. The right ventricular  size is normal.  3. The mitral valve is grossly normal. Trivial mitral valve  regurgitation.  4. The aortic valve is tricuspid. Aortic valve regurgitation is not  visualized. Mild aortic valve sclerosis is present, with no evidence of  aortic valve stenosis.   Patient Profile     64 y.o. 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 Bryna Colander.   Assessment & Plan    Atrial fib:  Mr. Anurag Scarfo has a CHA2DS2 - VASc  score of 1.   Now back in NSR.  No further treatment.  No further cardiac work up.  We will see as needed.    No plan for beta blocker.  His BP is labile.   For questions or updates, please contact [facility] Please consult [email or web address] for contact info under Cardiology/STEMI.   Signed, Minus Breeding, MD  10/25/2019, 9:26 AM

## 2019-10-25 NOTE — Progress Notes (Signed)
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  This improved with IV fluids.  Imaging right lower lobe consolidation.  Admit to progressive care with PCCM consult for vent management  Past Medical History  Spinal stenosis, hepatitis C, hypertension, chronic respiratory failure trach and vent dependent  Significant Hospital Events   3/19-admit to progressive care  Consults:  PCCM General surgery  Procedures:   Significant Diagnostic Tests:  3/19 CT abdomen pelvis>>Mildly dilated fluid-filled loops of proximal and mid small bowel with transition to decompressed distal and terminal ileum suggestive of low-grade bowel obstruction 3/19 CT chest>>Complete collapse of the RLL secondary to aspirated debris within the RLL bronchus. Consolidation in the posterior segments of the RUL concerning for aspiration.Cholelithiasis with questionable gallbladder wall thickening.  3/19-3/20: significant stool burden on CT and KUB (personal interpretation) 3/23> Echo: LVEF 55-60% without RWMA and with trivial mitral valve regurgitation  Micro Data:  3/19 SARS-Covid-2>> negative 3/19 urine culture>> E. Faecalis  3/19 blood cultures x2>> negative x4 days 3/20 respiratory culture >> Serratia marcescens, R-cefazolin  Antimicrobials:  Cefepime 3/19>> Flagyl 3/19 Vancomycin 3/19>3/21  Interim history/subjective:  Awake and alert. No acute distress. He notes feeling well over night  Objective   Blood pressure 108/64, pulse (!) 107, temperature 98.3 F (36.8 C), temperature source Oral, resp. rate (!) 24, height 5\' 8"  (1.727 m),  weight 134.3 kg, SpO2 94 %.    Vent Mode: PRVC FiO2 (%):  [40 %-50 %] 50 % Set Rate:  [12 bmp] 12 bmp Vt Set:  [500 mL] 500 mL PEEP:  [5 cmH20] 5 cmH20 Pressure Support:  [12 cmH20-14 cmH20] 12 cmH20 Plateau Pressure:  [10 cmH20-16 cmH20] 16 cmH20   Intake/Output Summary (Last 24 hours) at 10/25/2019 10/27/2019 Last data filed at 10/25/2019 0700 Gross per 24 hour  Intake 320 ml  Output 1050 ml  Net -730 ml   Filed Weights   10/22/19 0500 10/24/19 0500 10/25/19 0500  Weight: 130.6 kg 134.3 kg 134.3 kg   General: Chronically ill-appearing male in no acute distress HEENT: MM pink/moist, trach in place. Poor dentition.  Neuro: Awake and responsive. No obvious cranial nerve deficits; sensation grossly intact in bilateral upper and lower extremities; strength 1/5 in bilateral lower extremities CV: s1s2 present, RRR, no m/r/g PULM: Decreased air movement right lower lobe GI: nondistended, nontender, soft, bsx4 active  Extremities: no edema appreciated; RLE cool to touch compared with LLE Skin: chronic venous stasis changes of bilateral lower extremities  Resolved Hospital Problem list   Hypotension  Assessment & Plan:   Right lower lobe collapse, chronic trach and vent dependent Likely mucous plugging and aspiration event, history of Serratia and Pseudomonas pneumonia. Chronic ventilation may also impair LL expansion. Patient not able to tolerate trach collar at this time due to minimal air movement. Similar to prior attempts at [facility]. - Daily weaning trials  - Chest physiotherapy as tolerated  - Trach collar if tolerating at least 2 hours of trial  Atrial fibrillation, now back in SR. Hypokalemia CHA2DS-VASc2 score is 1. Patient is on metoprolol 12.5mg  bid and currently in sinus rhythm. Echo w/LVEF 55-60% without any RWMA and no apparent valvular abnormalities. -  Goal K>4, Mag >2  -TSH pending - Continue management per primary team   Ileus secondary to severe constipation with  overflow diarrhea.  Patient admitted for SBO, treated with aggressive bowel regimen. He has been tolerating trickle feeds. Patient having BM's and passing flatus. SMOG enema yesterday with large bowel movement.  - Continue aggressive bowel regimen  - Advance tube feeds as tolerated  Best practice:  Diet: Advance tube feeds as tolerated Pain/Anxiety/Delirium protocol (if indicated): vicodin  VAP protocol (if indicated): Head of bed 30 degrees DVT prophylaxis: Lovenox GI prophylaxis: PPI Glucose control: N/A Mobility: Bedrest Code Status: Full code Family Communication: Per primary Disposition: Progressive care  Labs   CBC: Recent Labs  Lab 10/25/2019 1902 10/08/2019 1916 10/21/19 0519 10/22/19 0048  WBC  --  10.3  --  13.6*  NEUTROABS  --  6.6  --  10.1*  HGB 14.3 14.6 13.3 13.4  HCT 42.0 43.5 39.0 39.7  MCV  --  92.8  --  93.2  PLT  --  359  --  527    Basic Metabolic Panel: Recent Labs  Lab 10/21/19 1614 10/21/19 1614 10/22/19 0048 10/22/19 1803 10/23/19 0710 10/24/19 1247 10/25/19 0211  NA 134*   < > 134* 132* 134* 135 141  K 3.1*   < > 3.4* 3.2* 3.1* 3.1* 2.7*  CL 89*   < > 95* 95* 98 100 105  CO2 30   < > 27 27 25 23 25   GLUCOSE 92   < > 97 102* 98 83 94  BUN 24*   < > 19 13 9  6* 9  CREATININE 0.38*   < > 0.32* 0.33* 0.33* <0.30* 0.31*  CALCIUM 8.7*   < > 8.5* 8.5* 8.3* 8.5* 8.8*  MG 1.7  --  1.8  --  1.6* 1.7 1.7  PHOS  --   --   --   --   --  3.8 3.8   < > = values in this interval not displayed.   GFR: Estimated Creatinine Clearance: 125.1 mL/min (A) (by C-G formula based on SCr of 0.31 mg/dL (L)). Recent Labs  Lab 10/24/2019 1916 10/10/2019 2112 10/21/19 0125 10/21/19 0417 10/22/19 0048  WBC 10.3  --   --   --  13.6*  LATICACIDVEN 4.2* 4.4* 2.6* 2.5*  --     Liver Function Tests: Recent Labs  Lab 10/25/2019 1916 10/22/19 0048  AST 27 20  ALT 29 26  ALKPHOS 126 107  BILITOT 1.6* 1.7*  PROT 7.7 6.5  ALBUMIN 2.9* 2.5*   No results for  input(s): LIPASE, AMYLASE in the last 168 hours. No results for input(s): AMMONIA in the last 168 hours.  ABG    Component Value Date/Time   PHART 7.505 (H) 10/21/2019 0519   PCO2ART 38.7 10/21/2019 0519   PO2ART 77.0 (L) 10/21/2019 0519   HCO3 30.5 (H) 10/21/2019 0519   TCO2 32 10/21/2019 0519   O2SAT 96.0 10/21/2019 0519     Coagulation Profile: Recent Labs  Lab 10/21/2019 1916  INR 1.1    Cardiac Enzymes: No results for input(s): CKTOTAL, CKMB, CKMBINDEX, TROPONINI in the last 168 hours.  HbA1C: No results found for: HGBA1C  CBG: Recent Labs  Lab 10/24/19 1517 10/24/19 2100 10/24/19 2351 10/25/19 0425 10/25/19 0740  GLUCAP 97 82 97 87 93     Critical care time: 35 minutes  Harvie Heck, MD  Internal Medicine, PGY-1 10/25/19 9:24 AM

## 2019-10-25 NOTE — Progress Notes (Signed)
eLink Physician-Brief Progress Note Patient Name: Brady [name] DOB: 06-21-1956 MRN: [id number]   Date of Service  10/25/2019  HPI/Events of Note  Pt does not have stress ulcer prophylaxis despite being on the ventilator.  eICU Interventions  Stress ulcer prophylaxis ordered.        Thomasene Lot Anshi Jalloh 10/25/2019, 4:02 AM

## 2019-10-25 NOTE — Progress Notes (Signed)
[facility] ADULT ICU REPLACEMENT PROTOCOL FOR AM LAB REPLACEMENT ONLY  The patient does apply for the [facility] Adult ICU Electrolyte Replacment Protocol based on the criteria listed below:   1. Is GFR >/= 40 ml/min? Yes.    Patient's GFR today is >60 2. Is urine output >/= 0.5 ml/kg/hr for the last 6 hours? Yes.   Patient's UOP is 1.05 ml/kg/hr 3. Is BUN < 60 mg/dL? Yes.    Patient's BUN today is 9 4. Abnormal electrolyte(s): K-2.7 5. Ordered repletion with: per protocol 6. If a panic level lab has been reported, has the CCM MD in charge been notified? Yes.  .   Physician:  Dr. Heywood Bene, [place] Boos 10/25/2019 5:52 AM

## 2019-10-25 NOTE — Progress Notes (Signed)
eLink Physician-Brief Progress Note Patient Name: Brandley Aldrete DOB: 03-23-1956 MRN: [id number]   Date of Service  10/25/2019  HPI/Events of Note  Pt needs a ORN medication via NG tube for pain.  eICU Interventions  Vicodin elixir 5-325 mg via NG tube Q 6 hours PRN pain        Cleatis Fandrich U Halston Fairclough 10/25/2019, 6:27 AM

## 2019-10-25 NOTE — Progress Notes (Signed)
Pt is currently sleeping. Will attempt CPT at a later time.

## 2019-10-25 NOTE — Progress Notes (Signed)
Subjective: Pt seen at the bedside this morning. Feeling alright, endorsing some shortness of breath. Trickle feeds resumed this AM. Denies chest pain, nausea, or abdominal pain.   Objective:  Vital signs in last 24 hours: Vitals:   10/25/19 0302 10/25/19 0400 10/25/19 0500 10/25/19 0600  BP:  97/63 (!) 101/57 108/64  Pulse:  97 98 (!) 110  Resp:  14 15 (!) 23  Temp:      TempSrc:      SpO2: 100% 100% 96% 91%  Weight:   134.3 kg   Height:       Physical Exam Vitals and nursing note reviewed.  Constitutional:      General: He is not in acute distress.    Appearance: He is ill-appearing (chronically).  Neck:     Comments: Trach in place Pulmonary:     Effort: No respiratory distress.  Abdominal:     General: Abdomen is flat. Bowel sounds are normal. There is no distension.     Palpations: Abdomen is soft.  Skin:    General: Skin is warm and dry.  Neurological:     Mental Status: He is alert.    Assessment/Plan:  Active Problems:   Hypotension   Lactic acidosis   Small bowel obstruction (HCC)   VAP (ventilator-associated pneumonia) (HCC)   Acute on chronic respiratory failure (HCC)   S/P bronchoscopy  64yo male w PMH vent dependent respiratory failure, HLD, atypical depression, COPD, chronic low back pain, and new PEG tube placement 10/05/2019 presentingfrom [facility] with SBO and new onset atrial fibrillation.   Small Bowel Obstruction - resolved Hypokalemia  CT findings suggestive of low grade bowel obstruction.Repeat abdominal x-rayon 3/21with contrast now in colon, persistent mildly dilated loops of small bowel seen in L abdomen (possible partial bowel obstruction). General surgery consulted - RemovedNG on 3/21 and tolerated trickle feeds on 3/22.  - trickle feeds resumed this morning after enema yesterday - per dietitian, start at 19mL/hr, titrate up 32mL q8h for goal of 46mL/hr - K 2.7, Mg 1.7, and  phos 3.8 today - replete with K solution x 3 per  tube and 4g IV Mg - goal K > 4 and Mg > 2  - pt s/p smog enema yesterday and having bowel movements - will continue daily bowel regimen with miralax and senokot   Serratia Pneumonia Vent Dependent Respiratory Failure  Underwent bronchoscopy and lavageon 3/20with respiratory culture with rare serratia marcescens. CXR on 3/21with persistent RLL collapse, slight improved aeration of RUL, and clear left lung. - day 6 of cefepime - given recurrence of pneumonia would favor longer than 7 day antibiotic course, likely 10 days - will continue antibiotics during this admission while concurrent placement arrangements are made - fungus and AFB cultures in process  PCCM consulted - RLL collapse likely mucous plugging and aspiration event - pt not tolerating trach collar at this time, appears to have failed with similar attempts at [facility]  - daily weaning trials - chest physiotherapy as tolerated   New Onset Atrial Fibrillationwith RVR - resolved Likely secondary to aspiration pneumonia and SBO. Given 5mg  IV metop x 1. CHA2DS- VACs2 of 1. Cardiology consulted, appreciate their recommendations - now in NSR - no further treatment or work-up - no need of beta blocker or anticoagulation - to see as needed   Prior to Admission Living Arrangement: [facility] Anticipated Discharge Location: LTAC Barriers to Discharge: placement Dispo: Anticipated discharge in approximately 1-2 day(s).   , MD 10/25/2019, 7:25  AM Pager: [phone]

## 2019-10-26 DIAGNOSIS — Z79899 Other long term (current) drug therapy: Secondary | ICD-10-CM

## 2019-10-26 DIAGNOSIS — Z8701 Personal history of pneumonia (recurrent): Secondary | ICD-10-CM

## 2019-10-26 DIAGNOSIS — L89151 Pressure ulcer of sacral region, stage 1: Secondary | ICD-10-CM | POA: Diagnosis not present

## 2019-10-26 DIAGNOSIS — I493 Ventricular premature depolarization: Secondary | ICD-10-CM

## 2019-10-26 LAB — BASIC METABOLIC PANEL
Anion gap: 6 (ref 5–15)
BUN: 14 mg/dL (ref 8–23)
CO2: 25 mmol/L (ref 22–32)
Calcium: 8.4 mg/dL — ABNORMAL LOW (ref 8.9–10.3)
Chloride: 111 mmol/L (ref 98–111)
Creatinine, Ser: 0.3 mg/dL — ABNORMAL LOW (ref 0.61–1.24)
GFR calc Af Amer: >60 mL/min (ref >60)
GFR calc non Af Amer: >60 mL/min (ref >60)
Glucose, Bld: 105 mg/dL — ABNORMAL HIGH (ref 70–99)
Potassium: 3.5 mmol/L (ref 3.5–5.1)
Sodium: 142 mmol/L (ref 135–145)

## 2019-10-26 LAB — GLUCOSE, CAPILLARY
Glucose-Capillary: 102 mg/dL — ABNORMAL HIGH (ref 70–99)
Glucose-Capillary: 105 mg/dL — ABNORMAL HIGH (ref 70–99)
Glucose-Capillary: 88 mg/dL (ref 70–99)
Glucose-Capillary: 89 mg/dL (ref 70–99)
Glucose-Capillary: 95 mg/dL (ref 70–99)
Glucose-Capillary: 95 mg/dL (ref 70–99)

## 2019-10-26 LAB — MAGNESIUM: Magnesium: 1.8 mg/dL (ref 1.7–2.4)

## 2019-10-26 MED ORDER — LACTATED RINGERS IV BOLUS
1000.0000 mL | Freq: Once | INTRAVENOUS | Status: AC
  Administered 2019-10-26: 1000 mL via INTRAVENOUS

## 2019-10-26 MED ORDER — METOPROLOL TARTRATE 25 MG/10 ML ORAL SUSPENSION
12.5000 mg | Freq: Two times a day (BID) | ORAL | Status: DC
  Administered 2019-10-26 – 2019-10-27 (×3): 12.5 mg
  Filled 2019-10-26 (×4): qty 5

## 2019-10-26 MED ORDER — MAGNESIUM SULFATE 2 GM/50ML IV SOLN
2.0000 g | Freq: Once | INTRAVENOUS | Status: AC
  Administered 2019-10-26: 2 g via INTRAVENOUS
  Filled 2019-10-26: qty 50

## 2019-10-26 MED ORDER — ENOXAPARIN SODIUM 80 MG/0.8ML ~~LOC~~ SOLN
0.5000 mg/kg | SUBCUTANEOUS | Status: DC
  Administered 2019-10-26 – 2019-10-27 (×2): 65 mg via SUBCUTANEOUS
  Filled 2019-10-26 (×2): qty 0.65

## 2019-10-26 MED ORDER — POTASSIUM CHLORIDE 20 MEQ/15ML (10%) PO SOLN
40.0000 meq | Freq: Once | ORAL | Status: AC
  Administered 2019-10-26: 40 meq via ORAL
  Filled 2019-10-26: qty 30

## 2019-10-26 NOTE — Progress Notes (Signed)
   Subjective:   He is feeling well today. Denies abdominal pain. He is having bowel movements.   Objective:  Vital signs in last 24 hours: Vitals:   10/26/19 0200 10/26/19 0300 10/26/19 0315 10/26/19 0400  BP: 103/63   (!) 105/56  Pulse: 73 98 90 89  Resp: 14 19 16 13   Temp:      TempSrc:      SpO2: 99% 100% 100% 100%  Weight:      Height:       Constitution: NAD, supine in bed Cardio: RRR, no m/r/g; no LE edema, no JVD Respiratory: trach in place on vent, CTA  Abdominal: soft, NTTP, non-distended Neuro: a&o, normal affect   Assessment/Plan:  Active Problems:   Hypotension   Lactic acidosis   Small bowel obstruction (HCC)   VAP (ventilator-associated pneumonia) (HCC)   Acute on chronic respiratory failure (HCC)   S/P bronchoscopy  64yo male w/PMH vent dependent respiratory failure 2/2 pneumonia in Feb 2021, HLD, atypical depression, COPD, chronic low back pain, PEG tube placement 10/05/19 presenting from [facility] with SBO and new onset atrial fibrillation.   Small Bowel Obstruction - resolved Hypokalemia, Hypomagnesemia Hypokalemia 2/2 SBO initially, then possibly smog enema. Repleting K & Mg. Bowel regimen per PCCM.    Serratia Pneumonia Vent Dependent Respiratory Failure Unable to wean from vent. This was attempted at [facility] as well without success. He is on day 7 abx, will continue for 10 days. PCCM managing vent, appreciate assistance and recommendations.  - cont. Cefepime  - f/u AFB & fungal cultures  - cont. chest physiotherapy  - cont. Stress dose ulcer prophylaxis w/ppi   New Onset A.Fib PVCs  Atrial fibrillation resolved. Started on 12.5 mg metoprolol. Will monitor blood pressures. No need for anticoagulation with CHADsVAsC score 1.  - metop started today  - cont. atorva 20 mg qd  Soft Blood Pressures Initially thought to be secondary to SBO. He does not appear hypovolemic and is asymptomatic. Am cortisol.   Anxiety Cont. Home buspar & seroquel 25  bid  Prior to Admission Living Arrangement: [facility] Anticipated Discharge Location: LTAC Barriers to Discharge: placement Dispo: Anticipated discharge pending placement.    12/05/19, MD 10/26/2019, 6:07 AM Pager: [phone]

## 2019-10-26 NOTE — Progress Notes (Signed)
.     Maintaining NSR.  No further recommendations.    [facility] will sign off.   Medication Recommendations:  Continue current low dose beta blocker.  No indication for anticoagulation  Other recommendations (labs, testing, etc):  NA Follow up as an outpatient:  NA

## 2019-10-26 NOTE — Progress Notes (Signed)
Patient complains of constant bowel movements, Dr. Denese Killings consulted. BM's should resolve soon, no PRN ordered.

## 2019-10-26 NOTE — Progress Notes (Signed)
Occupational Therapy Treatment Patient Details Name: Brady [name] MRN: [id number] DOB: Dec 13, 1955 Today's Date: 10/26/2019    History of present illness Pt is a 64 yo male s/p ileus/SBO. Pt vented with trach since Jan 2021. PMHx; pt reports 12 spincal surgeries, Spinal stenosis, hepatitis C, hypertension, chronic respiratory failure trach and vent dependent.   OT comments  Pt seen for OT f/u with focus on BUE strengthening and bed mobility in preparation for BADL. Pt completed following BUE exercises listed below. 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 Pt then completed Bil rolls in bed with max A +2 with increased tactile cues for sequencing. Pt following commands appropriately and communicating via lip reading this session. He deferred further mobility OOB despite max encouragement. Placed pt in semi chair position with BP At 93/60. D/c recs remain appropriate. Will continue to follow.    Follow Up Recommendations  SNF;LTACH    Equipment Recommendations  None recommended by OT    Recommendations for Other Services      Precautions / Restrictions Precautions Precautions: Fall;Other (comment) Precaution Comments: trach to vent, peg tube Restrictions Weight Bearing Restrictions: No       Mobility Bed Mobility Overal bed mobility: Needs Assistance Bed Mobility: Rolling Rolling: Max assist;+2 for physical assistance         General bed mobility comments: assist both to initiate with flexion of R vs L knee and to complete turn with pt reaching with UE, then assist to scoot to increased room for tunring,  Scooted up in bed with +2 A bed intrendelenberg; declined supine to sit this session  Transfers                      Balance                                           ADL either performed or assessed with clinical judgement   ADL Overall ADL's : Needs assistance/impaired Eating/Feeding: NPO                                      General ADL Comments: Pt can complete grooming tasks with LUE but fatigues quickly. Otherwise total A for OOB BADLs     Vision Baseline Vision/History: Wears glasses Wears Glasses: At all times Patient Visual Report: No change from baseline     Perception     Praxis      Cognition Arousal/Alertness: Awake/alert Behavior During Therapy: WFL for tasks assessed/performed Overall Cognitive Status: Difficult to assess                                 General Comments: pt communicating via lip reading, was able to follow basic commands appropriately        Exercises General Exercises - Upper Extremity Shoulder Flexion: AROM;AAROM;Right;Left;10 reps;Supine(increased assist on RUE) Shoulder Extension: AROM;AAROM;Right;10 reps;Supine Elbow Flexion: AROM;AAROM;10 reps;Right;Left;Supine Elbow Extension: AROM;AAROM;Right;Left;10 reps;Supine Digit Composite Flexion: AROM;5 reps;Both;Supine General Exercises - Lower Extremity Ankle Circles/Pumps: AAROM;5 reps;Both;Supine;10 reps Heel Slides: AAROM;5 reps;Supine;Both Other Exercises Other Exercises: hip internal rotation x 5 AAROM both LE in supine   Shoulder Instructions       General Comments VSS on vent; BP  soft to start, no change with elevation of HOB and pt alert and interactive    Pertinent Vitals/ Pain       Pain Assessment: Faces Faces Pain Scale: Hurts even more Pain Location: L LE with movement Pain Descriptors / Indicators: Discomfort;Tender Pain Intervention(s): Monitored during session;Repositioned  Home Living                                          Prior Functioning/Environment              Frequency  Min 2X/week        Progress Toward Goals  OT Goals(current goals can now be found in the care plan section)  Progress towards OT goals: Progressing toward goals  Acute Rehab OT Goals Patient Stated Goal: find another  rehab facility OT Goal Formulation: With patient Time For Goal Achievement: 11/06/19 Potential to Achieve Goals: Good  Plan Discharge plan remains appropriate    Co-evaluation    PT/OT/SLP Co-Evaluation/Treatment: Yes Reason for Co-Treatment: For patient/therapist safety;To address functional/ADL transfers;Complexity of the patient's impairments (multi-system involvement) PT goals addressed during session: Mobility/safety with mobility;Strengthening/ROM OT goals addressed during session: Strengthening/ROM;ADL's and self-care      AM-PAC OT "6 Clicks" Daily Activity     Outcome Measure   Help from another person eating meals?: Total Help from another person taking care of personal grooming?: A Lot Help from another person toileting, which includes using toliet, bedpan, or urinal?: Total Help from another person bathing (including washing, rinsing, drying)?: Total Help from another person to put on and taking off regular upper body clothing?: Total Help from another person to put on and taking off regular lower body clothing?: Total 6 Click Score: 7    End of Session Equipment Utilized During Treatment: Oxygen  OT Visit Diagnosis: Muscle weakness (generalized) (M62.81);Pain Pain - Right/Left: Left Pain - part of body: Leg   Activity Tolerance Patient limited by fatigue   Patient Left in bed;with call bell/phone within reach   Nurse Communication Mobility status        Time: 3532-9924 OT Time Calculation (min): 23 min  Charges: OT General Charges $OT Visit: 1 Visit OT Treatments $Therapeutic Exercise: 8-22 mins  Zenovia Jarred, MSOT, OTR/L [place] [facility] Office Number: [phone] Pager: [phone]  Zenovia Jarred 10/26/2019, 4:49 PM

## 2019-10-26 NOTE — Progress Notes (Signed)
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  This improved with IV fluids.  Imaging right lower lobe consolidation.  Admit to progressive care with PCCM consult for vent management  Past Medical History  Spinal stenosis, hepatitis C, hypertension, chronic respiratory failure trach and vent dependent  Significant Hospital Events   3/19-admit to progressive care  Consults:  PCCM General surgery  Procedures:   Significant Diagnostic Tests:  3/19 CT abdomen pelvis>>Mildly dilated fluid-filled loops of proximal and mid small bowel with transition to decompressed distal and terminal ileum suggestive of low-grade bowel obstruction 3/19 CT chest>>Complete collapse of the RLL secondary to aspirated debris within the RLL bronchus. Consolidation in the posterior segments of the RUL concerning for aspiration.Cholelithiasis with questionable gallbladder wall thickening. 3/19-3/20: significant stool burden on CT and KUB (personal interpretation) 3/23> Echo: LVEF 55-60% without RWMA and with trivial mitral valve regurgitation  Micro Data:  3/19 SARS-Covid-2>> negative 3/19 urine culture>> E. Faecalis  3/19 blood cultures x2>> negative x5 days 3/20 respiratory culture >> Serratia marcescens, R-cefazolin  Antimicrobials:  Cefepime 3/19>> Flagyl 3/19 Vancomycin 3/19>3/21  Interim history/subjective:  Awake and alert. No acute distress. He does not report any acute concerns today. He denies any chest pain or shortness of breath or abdominal pain. He has been having flatus and bowel movements.   Objective   Blood  pressure (!) 87/53, pulse 87, temperature 97.9 F (36.6 C), temperature source Oral, resp. rate 14, height 5\' 8"  (1.727 m), weight 134.3 kg, SpO2 98 %.    Vent Mode: PSV;CPAP FiO2 (%):  [40 %-50 %] 40 % Set Rate:  [12 bmp] 12 bmp Vt Set:  [500 mL] 500 mL PEEP:  [5 cmH20] 5 cmH20 Pressure Support:  [10 cmH20] 10 cmH20 Plateau Pressure:  [11 cmH20-16 cmH20] 16 cmH20   Intake/Output Summary (Last 24 hours) at 10/26/2019 0857 Last data filed at 10/26/2019 0600 Gross per 24 hour  Intake 654.46 ml  Output 1025 ml  Net -370.54 ml   Filed Weights   10/22/19 0500 10/24/19 0500 10/25/19 0500  Weight: 130.6 kg 134.3 kg 134.3 kg   General: Chronically ill-appearing male in no acute distress HEENT: MM pink/moist, trach in place. Poor dentition.  Neuro: Awake and responsive. No obvious cranial nerve deficits; sensation grossly intact in bilateral upper and lower extremities; strength 1/5 in bilateral lower extremities CV: s1s2 present, RRR, no m/r/g PULM: Decreased breath sounds at right lower lobe  GI: nondistended, nontender, soft, bsx4 active  Extremities: no edema appreciated; warm, dry Skin: chronic venous stasis changes of bilateral lower extremities  Resolved Hospital Problem list    Assessment & Plan:  Right lower lobe collapse, chronic trach and vent dependent Likely multifactorial in setting of chronic ventilation, mucous plugging and aspiration event, history of Serratia and Pseudomonas pneumonia. Patient not able to tolerate trach collar at this time due to minimal air movement. Similar to prior attempts at [facility]. - Chest physiotherapy as tolerated  - Daily weaning trials as tolerated - SLP evaluation as able   Atrial fibrillation, now back in SR. Hypokalemia CHA2DS-VASc2 score is 1. Patient is on metoprolol 12.5mg  bid and currently in sinus rhythm. Echo w/LVEF  55-60% without any RWMA and no apparent valvular abnormalities. - Goal K>4, Mag >2  -TSH pending - Continue  management per primary team   Ileus secondary to severe constipation with overflow diarrhea.  Patient admitted for SBO, treated with aggressive bowel regimen. Patient having BM's and passing flatus. He has been tolerating full feeds at this point. KUB yesterday appears improved from prior imaging.  - Continue aggressive bowel regimen  - Continue full feeds   Best practice:  Diet: Advance tube feeds as tolerated Pain/Anxiety/Delirium protocol (if indicated): vicodin  VAP protocol (if indicated): Head of bed 30 degrees DVT prophylaxis: Lovenox GI prophylaxis: PPI Glucose control: N/A Mobility: Bedrest Code Status: Full code Family Communication: Per primary Disposition: Progressive care  Labs   CBC: Recent Labs  Lab 2019/10/29 1902 29-Oct-2019 1916 10/21/19 0519 10/22/19 0048  WBC  --  10.3  --  13.6*  NEUTROABS  --  6.6  --  10.1*  HGB 14.3 14.6 13.3 13.4  HCT 42.0 43.5 39.0 39.7  MCV  --  92.8  --  93.2  PLT  --  359  --  266    Basic Metabolic Panel: Recent Labs  Lab 10/21/19 1614 10/21/19 1614 10/22/19 0048 10/22/19 0048 10/22/19 1803 10/23/19 0710 10/24/19 1247 10/25/19 0211 10/25/19 1938  NA 134*   < > 134*   < > 132* 134* 135 141 141  K 3.1*   < > 3.4*   < > 3.2* 3.1* 3.1* 2.7* 4.4  CL 89*   < > 95*   < > 95* 98 100 105 109  CO2 30   < > 27   < > 27 25 23 25 25   GLUCOSE 92   < > 97   < > 102* 98 83 94 108*  BUN 24*   < > 19   < > 13 9 6* 9 9  CREATININE 0.38*   < > 0.32*   < > 0.33* 0.33* <0.30* 0.31* 0.33*  CALCIUM 8.7*   < > 8.5*   < > 8.5* 8.3* 8.5* 8.8* 8.7*  MG 1.7  --  1.8  --   --  1.6* 1.7 1.7  --   PHOS  --   --   --   --   --   --  3.8 3.8  --    < > = values in this interval not displayed.   GFR: Estimated Creatinine Clearance: 125.1 mL/min (A) (by C-G formula based on SCr of 0.33 mg/dL (L)). Recent Labs  Lab 2019-10-29 1916 October 29, 2019 2112 10/21/19 0125 10/21/19 0417 10/22/19 0048  WBC 10.3  --   --   --  13.6*  LATICACIDVEN 4.2* 4.4* 2.6*  2.5*  --     Liver Function Tests: Recent Labs  Lab 10-29-19 1916 10/22/19 0048  AST 27 20  ALT 29 26  ALKPHOS 126 107  BILITOT 1.6* 1.7*  PROT 7.7 6.5  ALBUMIN 2.9* 2.5*   No results for input(s): LIPASE, AMYLASE in the last 168 hours. No results for input(s): AMMONIA in the last 168 hours.  ABG    Component Value Date/Time   PHART 7.505 (H) 10/21/2019 0519   PCO2ART 38.7 10/21/2019 0519   PO2ART 77.0 (L) 10/21/2019 0519   HCO3 30.5 (H) 10/21/2019 0519   TCO2 32 10/21/2019 0519   O2SAT 96.0 10/21/2019 0519     Coagulation Profile: Recent Labs  Lab 10/29/19 1916  INR 1.1    Cardiac Enzymes: No results for  input(s): CKTOTAL, CKMB, CKMBINDEX, TROPONINI in the last 168 hours.  HbA1C: No results found for: HGBA1C  CBG: Recent Labs  Lab 10/25/19 1116 10/25/19 1511 10/25/19 1937 10/26/19 0006 10/26/19 0758  GLUCAP 100* 96 101* 95 105*     Critical care time: 35 minutes  Harvie Heck, MD  Internal Medicine, PGY-1 10/26/19 8:57 AM

## 2019-10-26 NOTE — Progress Notes (Signed)
Pharmacy Antibiotic Note  Brady [name] is a 65 y.o. male admitted on Nov 11, 2019 with sepsis and pneumonia. Pharmacy has been consulted for cefepime dosing. Patient is afebrile but does have some soft blood pressures around 90-110/50-60. He is chronically trach/vent dependent. Admitting chest X-ray showed possible right lower lobe an right perihilar airspace opacities, chest X-ray 3/22 showed continued dense consolidation with the right lower lobe consistent with atelectasis or aspiration without significant changes from prior. All cultures negative except for a tracheal aspirate culture that grew serratia marcescens (R cefazolin) and an in/out cath urine culture that grew 10K enterococcus faecalis.  Renal function remains stable. Patient has a baseline low creatinine around 0.3 which may be falsely elevating his eCrCl of ~125 ml/min. He is making some urine.   Plan: Continue cefepime 2g IV q8h Follow up length of therapy. Per discussion with MD 3/24 will aim for ~10 days given patient's recurrence of pneumonia.   Height: 5\' 8"  (172.7 cm) Weight: 296 lb (134.3 kg) IBW/kg (Calculated) : 68.4  Temp (24hrs), Avg:97.9 F (36.6 C), Min:97.6 F (36.4 C), Max:98.3 F (36.8 C)  Recent Labs  Lab 2019-11-11 1916 Nov 11, 2019 2112 10/21/19 0125 10/21/19 0301 10/21/19 0417 10/21/19 0956 10/22/19 0048 10/22/19 0048 10/22/19 1803 10/23/19 0710 10/24/19 1247 10/25/19 0211 10/25/19 1938  WBC 10.3  --   --   --   --   --  13.6*  --   --   --   --   --   --   CREATININE 0.80  --   --    < >  --    < > 0.32*   < > 0.33* 0.33* <0.30* 0.31* 0.33*  LATICACIDVEN 4.2* 4.4* 2.6*  --  2.5*  --   --   --   --   --   --   --   --    < > = values in this interval not displayed.    Estimated Creatinine Clearance: 125.1 mL/min (A) (by C-G formula based on SCr of 0.33 mg/dL (L)).    Not on File  Antimicrobials this admission: 3/19 vancomycin >> 3/21 3/19 cefepime >>  3/19 flagyl x1    Microbiology  results: 3/20 Tracheal aspirate: rare Serratia (R cefazolin) 3/20 MRSA PCR: negative 3/19 BCx: NGTD 3/19 UCx: 10k Enterococcus faecalis   Thank you for allowing pharmacy to be a part of this patient's care.  4/19, PharmD PGY-1 Pharmacy Resident   Please check amion for clinical pharmacist contact number

## 2019-10-26 NOTE — NC FL2 (Signed)
[place] MEDICAID FL2 LEVEL OF CARE SCREENING TOOL     IDENTIFICATION  Patient Name: Brady [name] Birthdate: [birth year] Sex: male Admission Date (Current Location): 10/16/2019  [place] and [place] Number:  Inda Merlin)   Facility and Address:  [facility], [place] [address], [place], [place] [address]      Provider Number: [id number]  Attending Physician Name and Address:  Lucious Groves, DO  Relative Name and Phone Number:  Gevorg Brum [phone]    Current Level of Care: Hospital Recommended Level of Care: Vent SNF Prior Approval Number:    Date Approved/Denied:   PASRR Number: [id number]  Discharge Plan: Other (Comment)(Vent SNF)    Current Diagnoses: Patient Active Problem List   Diagnosis Date Noted  . Pressure injury of sacral region, stage 1 10/26/2019  . S/P bronchoscopy   . VAP (ventilator-associated pneumonia) ([address]) 10/21/2019  . Acute on chronic respiratory failure ([place]) 10/21/2019  . Atrial fibrillation with RVR ([place])   . Atelectasis   . Lactic acidosis   . Small bowel obstruction ([place])   . Hypotension 10/15/2019    Orientation RESPIRATION BLADDER Height & Weight     Self, Time, Situation, Place  Vent, Tracheostomy(Trach: Shiley 4m Cuffed Fi02 44%) Incontinent, External catheter Weight: 296 lb (134.3 kg) Height:  5' 8"  (172.7 cm)  BEHAVIORAL SYMPTOMS/MOOD NEUROLOGICAL BOWEL NUTRITION STATUS      Continent Feeding tube  AMBULATORY STATUS COMMUNICATION OF NEEDS Skin   Extensive Assist Non-Verbally(Due to vent)                         Personal Care Assistance Level of Assistance  Bathing, Dressing, Feeding Bathing Assistance: Maximum assistance   Dressing Assistance: Maximum assistance     Functional Limitations Info  Sight, Hearing, Speech Sight Info: Adequate Hearing Info: Adequate Speech Info: Impaired(Understands by lip reading)    SPECIAL CARE FACTORS FREQUENCY  PT (By licensed PT), OT (By licensed OT)      PT Frequency: 5x a week OT Frequency: 2x a week            Contractures Contractures Info: Not present    Additional Factors Info  Allergies, Code Status Code Status Info: Full Allergies Info: Not on File           Current Medications (10/26/2019):  This is the current hospital active medication list Current Facility-Administered Medications  Medication Dose Route Frequency Provider Last Rate Last Admin  . acetaminophen (TYLENOL) 160 MG/5ML solution 650 mg  650 mg Per Tube Q6H PRN Seawell, Jaimie A, DO   650 mg at 10/26/19 0546  . atorvastatin (LIPITOR) tablet 20 mg  20 mg Per Tube Daily Seawell, Jaimie A, DO   20 mg at 10/26/19 1040  . busPIRone (BUSPAR) tablet 5 mg  5 mg Per Tube TID Seawell, Jaimie A, DO   5 mg at 10/26/19 1040  . ceFEPIme (MAXIPIME) 2 g in sodium chloride 0.9 % 100 mL IVPB  2 g Intravenous QGlory Buff MD 200 mL/hr at 10/26/19 1300 Rate Verify at 10/26/19 1300  . chlorhexidine gluconate (MEDLINE KIT) (PERIDEX) 0.12 % solution 15 mL  15 mL Mouth Rinse BID MGilles ChiquitoB, MD   15 mL at 10/26/19 0951  . Chlorhexidine Gluconate Cloth 2 % PADS 6 each  6 each Topical Daily AKipp Brood MD   6 each at 10/25/19 2234  . docusate (COLACE) 50 MG/5ML liquid 100 mg  100 mg Per Tube  BID PRN Clydell Hakim, MD      . enoxaparin (LOVENOX) injection 65 mg  0.5 mg/kg Subcutaneous Q24H Joni Reining C, DO      . feeding supplement (PRO-STAT SUGAR FREE 64) liquid 30 mL  30 mL Per Tube BID Jillyn Ledger, PA-C   30 mL at 10/26/19 1040  . feeding supplement (VITAL HIGH PROTEIN) liquid 1,000 mL  1,000 mL Per Tube Q24H Jillyn Ledger, PA-C   1,000 mL at 10/25/19 1050  . HYDROcodone-acetaminophen (HYCET) 7.5-325 mg/15 ml solution 10 mL  10 mL Per Tube Q6H PRN Joni Reining C, DO   10 mL at 10/26/19 1101  . magnesium sulfate IVPB 2 g 50 mL  2 g Intravenous Once Minus Breeding, MD      . MEDLINE mouth rinse  15 mL Mouth Rinse 10 times per day Sid Falcon, MD   15 mL  at 10/26/19 1302  . metoprolol tartrate (LOPRESSOR) 25 mg/10 mL oral suspension 12.5 mg  12.5 mg Per Tube BID Minus Breeding, MD      . pantoprazole (PROTONIX) injection 40 mg  40 mg Intravenous Q24H Frederik Pear, MD   40 mg at 10/26/19 1041  . polyethylene glycol (MIRALAX / GLYCOLAX) packet 17 g  17 g Per Tube Daily Ladona Horns, MD      . potassium chloride 20 MEQ/15ML (10%) solution 40 mEq  40 mEq Oral Once Minus Breeding, MD      . QUEtiapine (SEROQUEL) tablet 25 mg  25 mg Per Tube BID Seawell, Jaimie A, DO   25 mg at 10/26/19 1040  . sennosides (SENOKOT) 8.8 MG/5ML syrup 10 mL  10 mL Per Tube QHS Ladona Horns, MD   10 mL at 10/25/19 2223     Discharge Medications: Please see discharge summary for a list of discharge medications.  Relevant Imaging Results:  Relevant Lab Results:   Additional Information SSN [id number]  [name], [place]

## 2019-10-26 NOTE — Progress Notes (Signed)
Physical Therapy Treatment Patient Details Name: Brady [name] MRN: [id number] DOB: [birth year] Today's Date: 10/26/2019    History of Present Illness Pt is a 64 yo male s/p ileus/SBO. Pt vented with trach since Jan 2021. PMHx; pt reports 12 spincal surgeries, Spinal stenosis, hepatitis C, hypertension, chronic respiratory failure trach and vent dependent.    PT Comments    Patient progressing slowly, but participated in rolling in bed in addition to some in bed exercise.  Encouraged to sit EOB next session.  Remains appropriate for SNF/LTACH setting at d/c.    Follow Up Recommendations  SNF;LTACH     Equipment Recommendations  Other (comment)(TBA)    Recommendations for Other Services       Precautions / Restrictions Precautions Precautions: Fall;Other (comment) Precaution Comments: trach, peg tube    Mobility  Bed Mobility Overal bed mobility: Needs Assistance Bed Mobility: Rolling Rolling: Max assist;+2 for physical assistance         General bed mobility comments: assist both to initiate with flexion of R vs L knee and to complete turn with pt reaching with UE, then assist to scoot to increased room for tunring,  Scooted up in bed with +2 A bed intrendelenberg; declined supine to sit this session  Transfers                    Ambulation/Gait                 Stairs             Wheelchair Mobility    Modified Rankin (Stroke Patients Only)       Balance                                            Cognition Arousal/Alertness: Awake/alert Behavior During Therapy: WFL for tasks assessed/performed Overall Cognitive Status: Difficult to assess                                 General Comments: w/trach on vent chronically, able to understand most communication via lip reading      Exercises General Exercises - Lower Extremity Ankle Circles/Pumps: AAROM;5 reps;Both;Supine;10 reps Heel Slides: AAROM;5  reps;Supine;Both Other Exercises Other Exercises: hip internal rotation x 5 AAROM both LE in supine    General Comments General comments (skin integrity, edema, etc.): VSS on vent; BP soft to start, no change with elevation of HOB and pt alert and interactive      Pertinent Vitals/Pain Pain Assessment: Faces Faces Pain Scale: Hurts even more Pain Location: L LE with movement Pain Descriptors / Indicators: Discomfort;Tender Pain Intervention(s): Monitored during session;Repositioned    Home Living                      Prior Function            PT Goals (current goals can now be found in the care plan section) Progress towards PT goals: Progressing toward goals    Frequency    Min 2X/week      PT Plan Current plan remains appropriate    Co-evaluation PT/OT/SLP Co-Evaluation/Treatment: Yes Reason for Co-Treatment: Necessary to address cognition/behavior during functional activity;Complexity of the patient's impairments (multi-system involvement);For patient/therapist safety;To address functional/ADL transfers PT goals addressed during session: Mobility/safety with mobility;Strengthening/ROM  AM-PAC PT "6 Clicks" Mobility   Outcome Measure  Help needed turning from your back to your side while in a flat bed without using bedrails?: Total Help needed moving from lying on your back to sitting on the side of a flat bed without using bedrails?: Total Help needed moving to and from a bed to a chair (including a wheelchair)?: Total Help needed standing up from a chair using your arms (e.g., wheelchair or bedside chair)?: Total Help needed to walk in hospital room?: Total Help needed climbing 3-5 steps with a railing? : Total 6 Click Score: 6    End of Session   Activity Tolerance: Patient limited by pain Patient left: in bed;with call bell/phone within reach   PT Visit Diagnosis: Other abnormalities of gait and mobility (R26.89);Muscle weakness  (generalized) (M62.81)     Time: 1025-8527 PT Time Calculation (min) (ACUTE ONLY): 26 min  Charges:  $Therapeutic Activity: 8-22 mins                     Magda Kiel, [place] [facility] [phone] 10/26/2019    Reginia Naas 10/26/2019, 1:39 PM

## 2019-10-26 NOTE — TOC Progression Note (Addendum)
Transition of Care [facility]) - Progression Note    Patient Details  Name: Brady [name] MRN: [id number] Date of Birth: 01/27/1956  [facility] of [facility]) CM/SW Contact  Nonda Lou, [place] Phone Number: 10/26/2019, 1:05 PM  Clinical Narrative:    CSW made telephone call to patient's spouse Misty. CSW informed spouse the call was to follow-up on the previous discussion about vent SNF placement. Patient's wife expressed preference for [facility]. CSW agreed to send referral. No further questions expressed at this time.   CSW faxed referrals to [facility] SN[facility] & [facility]. TOC team will continue to follow & assist with discharge planning needs.      Expected Discharge Plan and Services                                                 Social Determinants of Health (SDOH) Interventions    Readmission Risk Interventions No flowsheet data found.

## 2019-10-27 ENCOUNTER — Encounter (HOSPITAL_COMMUNITY): Payer: Self-pay | Admitting: Internal Medicine

## 2019-10-27 ENCOUNTER — Other Ambulatory Visit: Payer: Self-pay

## 2019-10-27 DIAGNOSIS — I959 Hypotension, unspecified: Secondary | ICD-10-CM

## 2019-10-27 LAB — GLUCOSE, CAPILLARY
Glucose-Capillary: 98 mg/dL (ref 70–99)
Glucose-Capillary: 100 mg/dL — ABNORMAL HIGH (ref 70–99)
Glucose-Capillary: 98 mg/dL (ref 70–99)
Glucose-Capillary: 99 mg/dL (ref 70–99)
Glucose-Capillary: 92 mg/dL (ref 70–99)
Glucose-Capillary: 76 mg/dL (ref 70–99)

## 2019-10-27 LAB — BASIC METABOLIC PANEL
Anion gap: 6 (ref 5–15)
BUN: 14 mg/dL (ref 8–23)
CO2: 24 mmol/L (ref 22–32)
Calcium: 8.4 mg/dL — ABNORMAL LOW (ref 8.9–10.3)
Chloride: 115 mmol/L — ABNORMAL HIGH (ref 98–111)
Creatinine, Ser: 0.33 mg/dL — ABNORMAL LOW (ref 0.61–1.24)
GFR calc Af Amer: >60 mL/min (ref >60)
GFR calc non Af Amer: >60 mL/min (ref >60)
Glucose, Bld: 103 mg/dL — ABNORMAL HIGH (ref 70–99)
Potassium: 3.5 mmol/L (ref 3.5–5.1)
Sodium: 145 mmol/L (ref 135–145)

## 2019-10-27 LAB — MAGNESIUM: Magnesium: 1.9 mg/dL (ref 1.7–2.4)

## 2019-10-27 LAB — CORTISOL-AM, BLOOD: Cortisol - AM: 13.1 ug/dL (ref 6.7–22.6)

## 2019-10-27 MED ORDER — POLYETHYLENE GLYCOL 3350 17 G PO PACK
17.0000 g | PACK | Freq: Every day | ORAL | Status: DC | PRN
  Filled 2019-10-27: qty 1

## 2019-10-27 MED ORDER — LACTATED RINGERS IV SOLN: INTRAVENOUS | Status: DC

## 2019-10-27 MED ORDER — SULFAMETHOXAZOLE-TRIMETHOPRIM 200-40 MG/5ML PO SUSP
32.0000 mL | Freq: Four times a day (QID) | ORAL | Status: AC
  Administered 2019-10-27 – 2019-10-29 (×10): 32 mL
  Filled 2019-10-27 (×10): qty 40

## 2019-10-27 MED ORDER — POTASSIUM CHLORIDE 20 MEQ/15ML (10%) PO SOLN
40.0000 meq | Freq: Once | ORAL | Status: AC
  Administered 2019-10-27: 40 meq
  Filled 2019-10-27: qty 30

## 2019-10-27 MED ORDER — PHENYLEPHRINE HCL-NACL 10-0.9 MG/250ML-% IV SOLN
25.0000 ug/min | INTRAVENOUS | Status: DC
  Administered 2019-10-27: 10 ug/min via INTRAVENOUS
  Administered 2019-10-27 (×2): 25 ug/min via INTRAVENOUS
  Filled 2019-10-27 (×3): qty 250

## 2019-10-27 MED ORDER — SODIUM CHLORIDE 0.9 % IV SOLN
250.0000 mL | INTRAVENOUS | Status: DC
  Administered 2019-10-27 – 2019-11-10 (×2): 250 mL via INTRAVENOUS

## 2019-10-27 MED ORDER — SENNOSIDES 8.8 MG/5ML PO SYRP: 10.0000 mL | ORAL_SOLUTION | Freq: Every evening | ORAL | Status: DC | PRN

## 2019-10-27 MED ORDER — INFLUENZA VAC SPLIT QUAD 0.5 ML IM SUSY: 0.5000 mL | PREFILLED_SYRINGE | INTRAMUSCULAR | Status: DC

## 2019-10-27 MED ORDER — SODIUM CHLORIDE 0.9 % IV BOLUS
500.0000 mL | Freq: Once | INTRAVENOUS | Status: AC
  Administered 2019-10-27: 500 mL via INTRAVENOUS

## 2019-10-27 NOTE — Social Work (Signed)
CSW made telephone call to [facility] SNF to get an alternative fax number to send referral. CSW unable to leave a voicemail with [facility]broah Loop. CSW contacted Eileen Stanford [phone] and left a voicemail.  CSW made telephone call to [facility] [phone] to follow-up on referral. CSW was informed referral is currently under review.   Verlon Au, CSW

## 2019-10-27 NOTE — Progress Notes (Signed)
eLink Physician-Brief Progress Note Patient Name: Kaidon Kinker DOB: 1955/10/17 MRN: [id number]   Date of Service  10/27/2019  HPI/Events of Note  K+ = 3.5 and Creatinine = 0.33   eICU Interventions  Will replace K+.      Intervention Category Major Interventions: Electrolyte abnormality - evaluation and management  Edna Rede Eugene 10/27/2019, 4:23 AM

## 2019-10-27 NOTE — Progress Notes (Signed)
Pt was placed on 5L/28% trach collar. Pt is tolerating at this time. RT will continue to monitor.

## 2019-10-27 NOTE — Progress Notes (Signed)
eLink Physician-Brief Progress Note Patient Name: Brady [name] DOB: [birth year] MRN: [id number]   Date of Service  10/27/2019  HPI/Events of Note  Hypotension - BP = 78/45 with MAP = 56. LVEF = 55-60%. No CVL or CVP.  eICU Interventions  Will order: 1. Phenylephrine IV infusion via PIV. Titrate to MAP >= 65.      Intervention Category Major Interventions: Hypotension - evaluation and management  Brady [name] 10/27/2019, 1:24 AM

## 2019-10-27 NOTE — Progress Notes (Signed)
   Subjective: Pt seen at the bedside this morning. Denies abdominal pain. Endorses continued bowel movements and intermittent nausea. Continues to feel full from tube feeds.  Objective:  Vital signs in last 24 hours: Vitals:   10/27/19 0418 10/27/19 0430 10/27/19 0500 10/27/19 0530  BP:  (!) 89/52 (!) 81/50 (!) 119/59  Pulse: (!) 109 93 99 (!) 114  Resp: (!) 24 18 18  (!) 26  Temp:      TempSrc:      SpO2: 98% 98% 98% 94%  Weight:   127 kg   Height:       Physical Exam Vitals and nursing note reviewed.  Constitutional:      General: He is not in acute distress.    Appearance: He is ill-appearing (chronically).  Neck:     Comments: Trach in place Cardiovascular:     Rate and Rhythm: Normal rate and regular rhythm.     Heart sounds: Normal heart sounds.  Pulmonary:     Effort: Pulmonary effort is normal.  Skin:    General: Skin is warm and dry.  Neurological:     Mental Status: He is alert.    Assessment/Plan:  Active Problems:   Hypotension   Lactic acidosis   Small bowel obstruction (HCC)   VAP (ventilator-associated pneumonia) (HCC)   Acute on chronic respiratory failure (HCC)   S/P bronchoscopy   Pressure injury of sacral region, stage 1  64yo male w/PMH vent dependent respiratory failure 2/2 pneumonia in Feb 2021, HLD, atypical depression, COPD, chronic low back pain, PEG tube placement 10/05/19 presenting from [facility] with SBO and new onset atrial fibrillation.   Small Bowel Obstruction - resolved Hypokalemia, Hypomagnesemia Hypokalemia 2/2 SBO initially, then possibly smog enema.  - continues to have ongoing loose stools - replete K and Mg - bowel regimen per PCCM    Hypotension Initially thought to be secondary to SBO, but now persistent possibly due to meds vs continued GI losses. BP overnight 78/45 with MAP 56, so vasopressor support initiated.  - normal AM cortisol - pt on low dose peripheral pressure support with phenylephrine to keep MAP >65 -  maintenance IV fluids  Serratia Pneumonia Vent Dependent Respiratory Failure - day 8 of antibiotics - narrow cefepime to Bactrim per tube - f/u AFB & fungal cultures  PCCM managing vent, appreciate assistance and recommendations.  - daily weaning trials as tolerated - trach collar trial today - chest physiotherapy - stress ulcer ppx with PPI  New Onset A.Fib PVCs  Atrial fibrillation resolved. On 12.5 mg metoprolol. No need for anticoagulation with CHADsVAsC score 1. Low BP's overnight as outlined above.  Anxiety Home buspar & seroquel 25 bid  Prior to Admission Living Arrangement: LTAC Anticipated Discharge Location: LTAC Barriers to Discharge: placement acceptance, TOC following up on referrals to [facility] and [facility] Dispo: Anticipated discharge in approximately 1-2 day(s).   [facility], MD 10/27/2019, 6:59 AM Pager: [phone]

## 2019-10-27 NOTE — Progress Notes (Addendum)
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  This improved with IV fluids.  Imaging right lower lobe consolidation.  Admit to progressive care with PCCM consult for vent management  Past Medical History  Spinal stenosis, hepatitis C, hypertension, chronic respiratory failure trach and vent dependent  Significant Hospital Events   3/19-admit to progressive care  Consults:  PCCM General surgery  Procedures:   Significant Diagnostic Tests:  3/19 CT abdomen pelvis>>Mildly dilated fluid-filled loops of proximal and mid small bowel with transition to decompressed distal and terminal ileum suggestive of low-grade bowel obstruction 3/19 CT chest>>Complete collapse of the RLL secondary to aspirated debris within the RLL bronchus. Consolidation in the posterior segments of the RUL concerning for aspiration.Cholelithiasis with questionable gallbladder wall thickening. 3/19-3/20: significant stool burden on CT and KUB (personal interpretation) 3/23> Echo: LVEF 55-60% without RWMA and with trivial mitral valve regurgitation  Micro Data:  3/19 SARS-Covid-2>> negative 3/19 urine culture>> E. Faecalis  3/19 blood cultures x2>> negative x5 days 3/20 respiratory culture >> Serratia marcescens, R-cefazolin  Antimicrobials:  Cefepime 3/19>> Flagyl 3/19 Vancomycin 3/19>3/21  Interim history/subjective:  Overnight, noted to be hypotensive after receiving metoprolol, seroquel and pain medication requiring levophed.  Awake and alert. No acute distress. He notes that he has had 6-7 bowel movements overnight and did not get much  rest.   Objective   Blood pressure (!) 93/52, pulse (!) 111, temperature 98 F (36.7 C), resp. rate (!) 24, height 5\' 8"  (1.727 m), weight 127 kg, SpO2 98 %.    Vent Mode: PSV;CPAP FiO2 (%):  [40 %] 40 % Set Rate:  [12 bmp] 12 bmp Vt Set:  [500 mL] 500 mL PEEP:  [5 cmH20] 5 cmH20 Pressure Support:  [5 cmH20-10 cmH20] 5 cmH20 Plateau Pressure:  [13 cmH20-19 cmH20] 13 cmH20   Intake/Output Summary (Last 24 hours) at 10/27/2019 0816 Last data filed at 10/27/2019 0400 Gross per 24 hour  Intake 3114.98 ml  Output 650 ml  Net 2464.98 ml   Filed Weights   10/24/19 0500 10/25/19 0500 10/27/19 0500  Weight: 134.3 kg 134.3 kg 127 kg   General: Chronically ill-appearing male in no acute distress HEENT: MM pink/moist, trach in place. Poor dentition.  Neuro: Awake and responsive. No obvious cranial nerve deficits; sensation grossly intact in bilateral upper and lower extremities; strength 1/5 in bilateral lower extremities CV: s1s2 present, RRR, no m/r/g PULM: Diminished breath sounds at right lower lobe  GI: nondistended, nontender, soft, bsx4 active  Extremities: no edema appreciated; warm, dry Skin: chronic venous stasis changes of bilateral lower extremities  Resolved Hospital Problem list    Assessment & Plan:  Right lower lobe collapse, chronic trach and vent dependent Serratia PNA Likely multifactorial in setting of chronic ventilation, mucous plugging and aspiration event, history of Serratia and Pseudomonas pneumonia. Have not been able to wean at [facility] or during hospitalization. However, this morning, has been tolerating SBT for 3 hours.  - Chest physiotherapy as tolerated  - Daily weaning trials as tolerated - Trach collar trial today  - SLP evaluation as able  - Continue cefepime day 8/10  New onset Atrial fibrillation (resolved) Hypokalemia Hypotension CHA2DS-VASc2  score is 1. Patient is on metoprolol 12.5mg  bid and currently in sinus rhythm. Echo w/LVEF 55-60%  without any RWMA and no apparent valvular abnormalities. Overnight, noted to be hypotensive requiring fluids and vasopressor to keep MAP >65 after receiving metoprolol, seroquel and pain medication. Patient did have multiple bowel movements yesterday and suspect that his GI losses also contributing to the hypotension and hypokalemia.  - Goal K>4, Mag >2  - Maintenance IV fluids  - Continue management per primary team   Ileus secondary to severe constipation with overflow diarrhea.  Patient admitted for SBO, treated with aggressive bowel regimen. Patient having BM's and passing flatus. He has been tolerating full feeds at this point. KUB on 3/25 appears improved from prior imaging. Patient is having multiple bowel movements following aggressive bowel regimen. At this time, will titrate for goal of 1-2 BM per day.  - Continue bowel regimen  - Continue full feeds as tolerated  Best practice:  Diet: Advance tube feeds as tolerated Pain/Anxiety/Delirium protocol (if indicated): vicodin  VAP protocol (if indicated): Head of bed 30 degrees DVT prophylaxis: Lovenox GI prophylaxis: PPI Glucose control: N/A Mobility: Bedrest Code Status: Full code Family Communication: Per primary Disposition: Progressive care  Labs   CBC: Recent Labs  Lab Nov 19, 2019 1902 11/24/2019 1916 10/21/19 0519 10/22/19 0048  WBC  --  10.3  --  13.6*  NEUTROABS  --  6.6  --  10.1*  HGB 14.3 14.6 13.3 13.4  HCT 42.0 43.5 39.0 39.7  MCV  --  92.8  --  93.2  PLT  --  359  --  266    Basic Metabolic Panel: Recent Labs  Lab 10/23/19 0710 10/23/19 0710 10/24/19 1247 10/25/19 0211 10/25/19 1938 10/26/19 0955 10/27/19 0155  NA 134*   < > 135 141 141 142 145  K 3.1*   < > 3.1* 2.7* 4.4 3.5 3.5  CL 98   < > 100 105 109 111 115*  CO2 25   < > 23 25 25 25 24   GLUCOSE 98   < > 83 94 108* 105* 103*  BUN 9   < > 6* 9 9 14 14   CREATININE 0.33*   < > <0.30* 0.31* 0.33* 0.30* 0.33*  CALCIUM 8.3*   < > 8.5* 8.8* 8.7*  8.4* 8.4*  MG 1.6*  --  1.7 1.7  --  1.8 1.9  PHOS  --   --  3.8 3.8  --   --   --    < > = values in this interval not displayed.   GFR: Estimated Creatinine Clearance: 121.1 mL/min (A) (by C-G formula based on SCr of 0.33 mg/dL (L)). Recent Labs  Lab 11/06/2019 1916 11/14/2019 2112 10/21/19 0125 10/21/19 0417 10/22/19 0048  WBC 10.3  --   --   --  13.6*  LATICACIDVEN 4.2* 4.4* 2.6* 2.5*  --     Liver Function Tests: Recent Labs  Lab 11/07/2019 1916 10/22/19 0048  AST 27 20  ALT 29 26  ALKPHOS 126 107  BILITOT 1.6* 1.7*  PROT 7.7 6.5  ALBUMIN 2.9* 2.5*   No results for input(s): LIPASE, AMYLASE in the last 168 hours. No results for input(s): AMMONIA in the last 168 hours.  ABG    Component Value Date/Time   PHART 7.505 (H) 10/21/2019 0519   PCO2ART 38.7 10/21/2019 0519   PO2ART 77.0 (L) 10/21/2019 0519   HCO3 30.5 (H) 10/21/2019 0519   TCO2 32 10/21/2019 0519  O2SAT 96.0 10/21/2019 0519     Coagulation Profile: Recent Labs  Lab 10/31/2019 1916  INR 1.1    Cardiac Enzymes: No results for input(s): CKTOTAL, CKMB, CKMBINDEX, TROPONINI in the last 168 hours.  HbA1C: No results found for: HGBA1C  CBG: Recent Labs  Lab 10/26/19 1533 10/26/19 2027 10/26/19 2349 10/27/19 0326 10/27/19 0726  GLUCAP 95 88 102* 98 100*     Critical care time: 35 minutes  Harvie Heck, MD  Internal Medicine, PGY-1 10/27/19 8:16 AM

## 2019-10-27 NOTE — Progress Notes (Signed)
eLink Physician-Brief Progress Note Patient Name: Brady [name] DOB: [birth year] MRN: [id number]   Date of Service  10/27/2019  HPI/Events of Note  Hypotension - SBP = 70's to 80's post Metoprolol, Seroquel and pain medication. LVEF = 55-60%  eICU Interventions  Will order: 1. Bolus with 0.9 NaCl 1 liter IV over 1 hour now.      Intervention Category Major Interventions: Hypotension - evaluation and management  Khalidah Herbold Eugene 10/27/2019, 12:16 AM

## 2019-10-28 LAB — GLUCOSE, CAPILLARY
Glucose-Capillary: 85 mg/dL (ref 70–99)
Glucose-Capillary: 80 mg/dL (ref 70–99)
Glucose-Capillary: 95 mg/dL (ref 70–99)
Glucose-Capillary: 83 mg/dL (ref 70–99)
Glucose-Capillary: 87 mg/dL (ref 70–99)
Glucose-Capillary: 88 mg/dL (ref 70–99)

## 2019-10-28 LAB — BASIC METABOLIC PANEL
Anion gap: 9 (ref 5–15)
BUN: 12 mg/dL (ref 8–23)
CO2: 24 mmol/L (ref 22–32)
Calcium: 8.8 mg/dL — ABNORMAL LOW (ref 8.9–10.3)
Chloride: 113 mmol/L — ABNORMAL HIGH (ref 98–111)
Creatinine, Ser: 0.31 mg/dL — ABNORMAL LOW (ref 0.61–1.24)
GFR calc Af Amer: >60 mL/min (ref >60)
GFR calc non Af Amer: >60 mL/min (ref >60)
Glucose, Bld: 87 mg/dL (ref 70–99)
Potassium: 3.5 mmol/L (ref 3.5–5.1)
Sodium: 146 mmol/L — ABNORMAL HIGH (ref 135–145)

## 2019-10-28 LAB — MAGNESIUM: Magnesium: 1.8 mg/dL (ref 1.7–2.4)

## 2019-10-28 LAB — CBC
HCT: 38.7 % — ABNORMAL LOW (ref 39.0–52.0)
Hemoglobin: 11.9 g/dL — ABNORMAL LOW (ref 13.0–17.0)
MCH: 30.4 pg (ref 26.0–34.0)
MCHC: 30.7 g/dL (ref 30.0–36.0)
MCV: 99 fL (ref 80.0–100.0)
Platelets: 230 10*3/uL (ref 150–400)
RBC: 3.91 MIL/uL — ABNORMAL LOW (ref 4.22–5.81)
RDW: 13.6 % (ref 11.5–15.5)
WBC: 14.5 10*3/uL — ABNORMAL HIGH (ref 4.0–10.5)
nRBC: 0 % (ref 0.0–0.2)

## 2019-10-28 MED ORDER — DEXTROSE IN LACTATED RINGERS 5 % IV SOLN: INTRAVENOUS | Status: DC

## 2019-10-28 MED ORDER — LACTATED RINGERS IV BOLUS
1000.0000 mL | Freq: Once | INTRAVENOUS | Status: AC
  Administered 2019-10-28: 1000 mL via INTRAVENOUS

## 2019-10-28 MED ORDER — MIDODRINE HCL 5 MG PO TABS
2.5000 mg | ORAL_TABLET | Freq: Three times a day (TID) | ORAL | Status: DC
  Administered 2019-10-28 (×2): 2.5 mg via ORAL
  Filled 2019-10-28 (×2): qty 1

## 2019-10-28 MED ORDER — FREE WATER
200.0000 mL | Freq: Four times a day (QID) | Status: DC
  Administered 2019-10-28 – 2019-10-29 (×5): 200 mL

## 2019-10-28 MED ORDER — MIDODRINE HCL 5 MG PO TABS
5.0000 mg | ORAL_TABLET | Freq: Three times a day (TID) | ORAL | Status: DC
  Administered 2019-10-28 – 2019-10-29 (×3): 5 mg via ORAL
  Filled 2019-10-28 (×4): qty 1

## 2019-10-28 MED ORDER — ENOXAPARIN SODIUM 60 MG/0.6ML ~~LOC~~ SOLN
60.0000 mg | SUBCUTANEOUS | Status: DC
  Administered 2019-10-28 – 2019-11-03 (×7): 60 mg via SUBCUTANEOUS
  Filled 2019-10-28 (×8): qty 0.6

## 2019-10-28 NOTE — Progress Notes (Signed)
eLink Physician-Brief Progress Note Patient Name: Brady [name] DOB: 1955-08-20 MRN: [id number]   Date of Service  10/28/2019  HPI/Events of Note  Low normal blood sugar,  Pt is NPO currently.  eICU Interventions  D 5 % LR started at 75 ml/ hour.        [name] 10/28/2019, 12:02 AM

## 2019-10-28 NOTE — Progress Notes (Signed)
   Subjective:   He is feeling good today. He denies abdominal pain, is having bowel movements. He denies shortness of breath. He feels full from his tube feeds and would like them decreased for now.   Objective:  Vital signs in last 24 hours: Vitals:   10/28/19 0345 10/28/19 0400 10/28/19 0415 10/28/19 0500  BP: (!) 94/53 (!) 112/52 (!) 113/56   Pulse: 68 77 86   Resp: 14 16 16    Temp:  98.7 F (37.1 C)    TempSrc:  Oral    SpO2: 100% 100% 100%   Weight:    129 kg  Height:        Constitution: NAD, chronically ill appearing HENT: trach in place, [place]/AT  Cardio: RRR, no m/r/g, +trace edema Respiratory: CTA, no w/r/r, vent dependent Abdominal: NTTP, soft, non-distended, +BS MSK: moving all extremities Neuro: normal affect, a&ox3 GU: requiring rectal pouch Skin: c/d/i   Assessment/Plan:  Active Problems:   Hypotension   Lactic acidosis   Small bowel obstruction (HCC)   VAP (ventilator-associated pneumonia) (HCC)   Acute on chronic respiratory failure (HCC)   S/P bronchoscopy   Pressure injury of sacral region, stage 1  64yo male w/PMH vent dependent respiratory failure 2/2 pneumonia in Feb 2021, HLD, atypical depression, COPD, chronic low back pain, PEG tube placement 10/05/19 presenting from [facility] with SBO and recurrence of serratia pneumonia.   SBO - resolved Hypokalemia Hypomagnesemia Continue K and Mg as needed  Bowel regimen per PCCM  Hypotension Initially thought to be secondary to SBO but persisting. Possibly secondary to ongoing GI losses vs. Meds. Cortisol yesterday normal. Requiring phenylephrine. Nursing tried to wean patient this morning but MAPs dropped to the 50s.   - start low dose midodrine - will need to transfer to ICU service if unable to wean neo.  - stop metoprolol until bp can tolerate - Stop LR maint, may continue to require intermittent boluses, start po water boluses.   Serratia Pneumonia  Vent Dependent Respiratory Failure Day 9/10  antibiotics, narrowed to bactrim yesterday. Continuing to have leukocytosis.   - cont. Bactrim until tomorrow - persistent leukocytosis but otherwise clinically improved, afebrile.  - f/u AFB & fungal cultures  - undergoing daily weaning trial per PCCM  - cont. Chest physiotherapy  - cont. PPI   New Onset A. Fib PVCs Atrial fibrillation resolved. 12.5 mg metoprolol started with intermittent PVC burden and low sinus tachycardia. CHADsVAsC score 1.   - holding metoprolol with hypotension   Anxiety Cont. buspar and seroquel 25 mg bid  VTE: lovenox IVF: LR Diet: tube feeds Code: full   Dispo: Anticipated discharge wean from pressors and LTACH placement.   12/05/19, DO 10/28/2019, 6:31 AM Pager: [phone]

## 2019-10-28 NOTE — Progress Notes (Signed)
eLink Physician-Brief Progress Note Patient Name: Brady [name] DOB: 04/29/1956 MRN: [id number]   Date of Service  10/28/2019  HPI/Events of Note  Pt more short of breath than his baseline.  eICU Interventions  Portable CXR ordered.        Thomasene Lot Asif Muchow 10/28/2019, 11:53 PM

## 2019-10-29 ENCOUNTER — Inpatient Hospital Stay (HOSPITAL_COMMUNITY): Payer: Medicare Other

## 2019-10-29 LAB — CBC
HCT: 39.4 % (ref 39.0–52.0)
Hemoglobin: 12.3 g/dL — ABNORMAL LOW (ref 13.0–17.0)
MCH: 30.4 pg (ref 26.0–34.0)
MCHC: 31.2 g/dL (ref 30.0–36.0)
MCV: 97.5 fL (ref 80.0–100.0)
Platelets: 252 10*3/uL (ref 150–400)
RBC: 4.04 MIL/uL — ABNORMAL LOW (ref 4.22–5.81)
RDW: 13.7 % (ref 11.5–15.5)
WBC: 11.5 10*3/uL — ABNORMAL HIGH (ref 4.0–10.5)
nRBC: 0 % (ref 0.0–0.2)

## 2019-10-29 LAB — COMPREHENSIVE METABOLIC PANEL
ALT: 16 U/L (ref 0–44)
AST: 15 U/L (ref 15–41)
Albumin: 2.2 g/dL — ABNORMAL LOW (ref 3.5–5.0)
Alkaline Phosphatase: 98 U/L (ref 38–126)
Anion gap: 7 (ref 5–15)
BUN: 12 mg/dL (ref 8–23)
CO2: 27 mmol/L (ref 22–32)
Calcium: 9.1 mg/dL (ref 8.9–10.3)
Chloride: 112 mmol/L — ABNORMAL HIGH (ref 98–111)
Creatinine, Ser: 0.34 mg/dL — ABNORMAL LOW (ref 0.61–1.24)
GFR calc Af Amer: >60 mL/min (ref >60)
GFR calc non Af Amer: >60 mL/min (ref >60)
Glucose, Bld: 96 mg/dL (ref 70–99)
Potassium: 3.1 mmol/L — ABNORMAL LOW (ref 3.5–5.1)
Sodium: 146 mmol/L — ABNORMAL HIGH (ref 135–145)
Total Bilirubin: 0.6 mg/dL (ref 0.3–1.2)
Total Protein: 6.4 g/dL — ABNORMAL LOW (ref 6.5–8.1)

## 2019-10-29 LAB — GLUCOSE, CAPILLARY
Glucose-Capillary: 85 mg/dL (ref 70–99)
Glucose-Capillary: 90 mg/dL (ref 70–99)
Glucose-Capillary: 99 mg/dL (ref 70–99)
Glucose-Capillary: 90 mg/dL (ref 70–99)
Glucose-Capillary: 86 mg/dL (ref 70–99)
Glucose-Capillary: 90 mg/dL (ref 70–99)

## 2019-10-29 LAB — MAGNESIUM: Magnesium: 1.6 mg/dL — ABNORMAL LOW (ref 1.7–2.4)

## 2019-10-29 MED ORDER — POTASSIUM CHLORIDE 20 MEQ PO PACK
40.0000 meq | PACK | Freq: Once | ORAL | Status: DC
  Filled 2019-10-29: qty 2

## 2019-10-29 MED ORDER — LORAZEPAM 2 MG/ML IJ SOLN
1.0000 mg | INTRAMUSCULAR | Status: DC | PRN
  Administered 2019-10-29 – 2019-11-17 (×69): 1 mg via INTRAVENOUS
  Filled 2019-10-29 (×71): qty 1

## 2019-10-29 MED ORDER — VITAL HIGH PROTEIN PO LIQD
1000.0000 mL | ORAL | Status: DC
  Administered 2019-10-30: 1000 mL
  Filled 2019-10-29: qty 1000

## 2019-10-29 MED ORDER — SODIUM CHLORIDE 0.9% FLUSH
10.0000 mL | Freq: Two times a day (BID) | INTRAVENOUS | Status: DC
  Administered 2019-10-29: 10 mL

## 2019-10-29 MED ORDER — SENNOSIDES 8.8 MG/5ML PO SYRP: 10.0000 mL | ORAL_SOLUTION | Freq: Every day | ORAL | Status: DC

## 2019-10-29 MED ORDER — POTASSIUM CHLORIDE 20 MEQ PO PACK
40.0000 meq | PACK | ORAL | Status: DC
  Filled 2019-10-29: qty 2

## 2019-10-29 MED ORDER — FREE WATER
200.0000 mL | Status: DC
  Administered 2019-10-29 – 2019-10-30 (×6): 200 mL

## 2019-10-29 MED ORDER — MAGNESIUM SULFATE 4 GM/100ML IV SOLN
4.0000 g | Freq: Once | INTRAVENOUS | Status: AC
  Administered 2019-10-29: 4 g via INTRAVENOUS
  Filled 2019-10-29: qty 100

## 2019-10-29 MED ORDER — SODIUM CHLORIDE 0.9% FLUSH: 10.0000 mL | INTRAVENOUS | Status: DC | PRN

## 2019-10-29 MED ORDER — POTASSIUM CHLORIDE 20 MEQ PO PACK
40.0000 meq | PACK | Freq: Once | ORAL | Status: AC
  Administered 2019-10-29: 40 meq

## 2019-10-29 MED ORDER — POTASSIUM CHLORIDE 20 MEQ/15ML (10%) PO SOLN
30.0000 meq | ORAL | Status: AC
  Administered 2019-10-29 (×2): 30 meq
  Filled 2019-10-29 (×2): qty 30

## 2019-10-29 MED ORDER — SENNOSIDES 8.8 MG/5ML PO SYRP
10.0000 mL | ORAL_SOLUTION | Freq: Every evening | ORAL | Status: DC | PRN
  Administered 2019-11-12: 10 mL
  Filled 2019-10-29: qty 10

## 2019-10-29 MED ORDER — MIDODRINE HCL 5 MG PO TABS
5.0000 mg | ORAL_TABLET | Freq: Three times a day (TID) | ORAL | Status: DC
  Administered 2019-10-29 – 2019-11-18 (×60): 5 mg
  Filled 2019-10-29 (×59): qty 1

## 2019-10-29 NOTE — Progress Notes (Signed)
Mr. Orndoff was short of breath despite SpO2-96%. RR-20. Right sided lung sounds much more diminished than on his Left. Suctioning was non-productive and inner cannula was changed earlier by the RT. Elink contacted and updated x-ray suggested.

## 2019-10-29 NOTE — Progress Notes (Signed)
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  This improved with IV fluids.  Imaging right lower lobe consolidation.  Admit to progressive care with PCCM consult for vent management  Past Medical History  Spinal stenosis, hepatitis C, hypertension, chronic respiratory failure trach and vent dependent  Significant Hospital Events   3/19-admit to progressive care  Consults:  PCCM General surgery  Procedures:   Significant Diagnostic Tests:  3/19 CT abdomen pelvis>>Mildly dilated fluid-filled loops of proximal and mid small bowel with transition to decompressed distal and terminal ileum suggestive of low-grade bowel obstruction 3/19 CT chest>>Complete collapse of the RLL secondary to aspirated debris within the RLL bronchus. Consolidation in the posterior segments of the RUL concerning for aspiration.Cholelithiasis with questionable gallbladder wall thickening. 3/19-3/20: significant stool burden on CT and KUB (personal interpretation) 3/23> Echo: LVEF 55-60% without RWMA and with trivial mitral valve regurgitation  Micro Data:  3/19 SARS-Covid-2>> negative 3/19 urine culture>> E. Faecalis  3/19 blood cultures x2>> negative x5 days 3/20 respiratory culture >> Serratia marcescens, R-cefazolin  Antimicrobials:  Cefepime 3/19>> Flagyl 3/19 Vancomycin 3/19>3/21  Interim history/subjective:  Continues to clear out stool spontaneously without further laxative.  Has been able finally to tolerate pressure support weaning.  Patient however is anxious regarding attempting trach collar trial today.  Objective    Blood pressure (!) 110/56, pulse 90, temperature 97.9 F (36.6 C), temperature source Oral, resp. rate (!) 25, height 5\' 8"  (1.727 m), weight 129 kg, SpO2 95 %.    Vent Mode: PSV;CPAP FiO2 (%):  [40 %] 40 % Set Rate:  [12 bmp] 12 bmp Vt Set:  [500 mL] 500 mL PEEP:  [5 cmH20] 5 cmH20 Pressure Support:  [10 cmH20] 10 cmH20 Plateau Pressure:  [16 cmH20] 16 cmH20   Intake/Output Summary (Last 24 hours) at 10/29/2019 1643 Last data filed at 10/29/2019 1200 Gross per 24 hour  Intake 1244.56 ml  Output 1950 ml  Net -705.44 ml   Filed Weights   10/25/19 0500 10/27/19 0500 10/28/19 0500  Weight: 134.3 kg 127 kg 129 kg   General: Chronically ill-appearing male in no acute distress HEENT: MM pink/moist, trach in place. Poor dentition.  Neuro: Awake and responsive. No obvious cranial nerve deficits; sensation grossly intact in bilateral upper and lower extremities; strength 1/5 in bilateral lower extremities CV: s1s2 present, RRR, no m/r/g PULM: Chest clear throughout. GI: nondistended, nontender, soft, bsx4 active.  Continues to have liquid stool in FMS Extremities: no edema appreciated; warm, dry Skin: chronic venous stasis changes of bilateral lower extremities  Resolved Hospital Problem list    Assessment & Plan:  Right lower lobe collapse, chronic trach and vent dependent Serratia PNA Likely multifactorial in setting of chronic ventilation, mucous plugging and aspiration event, history of Serratia and Pseudomonas pneumonia. Have not been able to wean at [facility] or during hospitalization. However, this morning, has been tolerating SBT for 3 hours.  Have encouraged patient to attempt trach collar trialing. - Chest physiotherapy as tolerated  - Trach collar trial tomorrow. - SLP evaluation as able  - Continue cefepime day 8/10  Ileus secondary to severe constipation with overflow diarrhea.  Patient admitted for SBO,  treated with aggressive bowel regimen. Patient having BM's and  passing flatus. He has been tolerating full feeds at this point. KUB on 3/25 appears improved from prior imaging. Patient is having multiple bowel movements following aggressive bowel regimen. At this time, will titrate for goal of 1-2 BM per day.  - Continue bowel regimen  - Continue full feeds as tolerated  Best practice:  Diet: Advance tube feeds as tolerated Pain/Anxiety/Delirium protocol (if indicated): vicodin  VAP protocol (if indicated): Head of bed 30 degrees DVT prophylaxis: Lovenox GI prophylaxis: PPI Glucose control: N/A Mobility: Bedrest Code Status: Full code Family Communication: Per primary Disposition: Progressive care  Labs   CBC: Recent Labs  Lab 10/28/19 0253 10/29/19 0122  WBC 14.5* 11.5*  HGB 11.9* 12.3*  HCT 38.7* 39.4  MCV 99.0 97.5  PLT 230 073    Basic Metabolic Panel: Recent Labs  Lab 10/24/19 1247 10/24/19 1247 10/25/19 0211 10/25/19 0211 10/25/19 1938 10/26/19 0955 10/27/19 0155 10/28/19 0253 10/29/19 0122  NA 135   < > 141   < > 141 142 145 146* 146*  K 3.1*   < > 2.7*   < > 4.4 3.5 3.5 3.5 3.1*  CL 100   < > 105   < > 109 111 115* 113* 112*  CO2 23   < > 25   < > 25 25 24 24 27   GLUCOSE 83   < > 94   < > 108* 105* 103* 87 96  BUN 6*   < > 9   < > 9 14 14 12 12   CREATININE <0.30*   < > 0.31*   < > 0.33* 0.30* 0.33* 0.31* 0.34*  CALCIUM 8.5*   < > 8.8*   < > 8.7* 8.4* 8.4* 8.8* 9.1  MG 1.7   < > 1.7  --   --  1.8 1.9 1.8 1.6*  PHOS 3.8  --  3.8  --   --   --   --   --   --    < > = values in this interval not displayed.   GFR: Estimated Creatinine Clearance: 122.2 mL/min (A) (by C-G formula based on SCr of 0.34 mg/dL (L)). Recent Labs  Lab 10/28/19 0253 10/29/19 0122  WBC 14.5* 11.5*    Liver Function Tests: Recent Labs  Lab 10/29/19 0122  AST 15  ALT 16  ALKPHOS 98  BILITOT 0.6  PROT 6.4*  ALBUMIN 2.2*   No results for input(s): LIPASE, AMYLASE in the last 168 hours. No results for input(s): AMMONIA in the last  168 hours.  ABG    Component Value Date/Time   PHART 7.505 (H) 10/21/2019 0519   PCO2ART 38.7 10/21/2019 0519   PO2ART 77.0 (L) 10/21/2019 0519   HCO3 30.5 (H) 10/21/2019 0519   TCO2 32 10/21/2019 0519   O2SAT 96.0 10/21/2019 0519     Coagulation Profile: No results for input(s): INR, PROTIME in the last 168 hours.  Cardiac Enzymes: No results for input(s): CKTOTAL, CKMB, CKMBINDEX, TROPONINI in the last 168 hours.  HbA1C: No results found for: HGBA1C  CBG: Recent Labs  Lab 10/28/19 2312 10/29/19 0317 10/29/19 0755 10/29/19 1120 10/29/19 Mitchell New [place], [place] ICU Physician [place]  Pager: [phone] Mobile: [phone] After hours: [phone].  10/29/2019, 4:44 PM

## 2019-10-29 NOTE — Progress Notes (Signed)
   I saw and examined the patient. I reviewed the resident's note and I agree with the resident's findings and plan as documented in the resident's note.  Hospital day #8 for this 64 year old person on a ventilator for several months through a tracheostomy due to a complicated pneumonia hospitalization readmitted with small bowel obstruction and making some improvements.  He appears comfortable today, he is on full PRVC vent support.  He is having significant stool output, has a rectal tube in place, abdomen is soft and nontender, he has a sensation of abdominal bloating but not pain.  He has a gastric tube and receiving continuous feeds and we started free water flushes yesterday.  We will titrate the free water until the hypernatremia resolves.  Hypotension is improving, he appears euvolemic today, likely this is related to recent critical illness.  Titrated off of phenylephrine and started on midodrine as he will likely need some support for the coming weeks.  He certainly will need to transition back to LTAC to continue ventilator weaning trials.  Seems like he still has a long road ahead of him.  Tyson Alias, MD 10/29/2019, 12:59 PM

## 2019-10-29 NOTE — Progress Notes (Signed)
Patient placed on ATC per MD. Patient didn't tolerate well. Patient states "he's struggling". Patients vitals were stable and patient in no distress. Patient wanted to go back on vent. MD made aware.

## 2019-10-29 NOTE — Progress Notes (Addendum)
   Subjective:   He feels like he is getting very full from his tube feeds and would like to have them decreased. He was having shortness of breath overnight but is feeling a little better this morning.   Objective:  Vital signs in last 24 hours: Vitals:   10/29/19 0118 10/29/19 0300 10/29/19 0400 10/29/19 0430  BP: 124/73  129/69 114/64  Pulse: (!) 104 (!) 104 (!) 101 80  Resp: 19 (!) 23 19 15   Temp:  98 F (36.7 C)    TempSrc:  Oral    SpO2:  93% 96% 96%  Weight:      Height:        Constitution: NAD, chronically ill appearing HENT: trach in place, [place]/AT Cardio: RRR, no m/r/g, no LE edema  Respiratory: CTA, decreased breath sounds on right, vent dependent Abdominal: NTTP, soft, non-distended, +BS Neuro: normal affect, a&ox3  Assessment/Plan:  Active Problems:   Hypotension   Lactic acidosis   Small bowel obstruction (HCC)   VAP (ventilator-associated pneumonia) (HCC)   Acute on chronic respiratory failure (HCC)   S/P bronchoscopy   Pressure injury of sacral region, stage 1   64 yo male with PMH of vent dependent respiratory failure 2/2 pneumonia in Feb 2021, HLD, atypical depression, COPD, chronic low back pain, PEG tube placement 10/05/19 presenting from [facility] with SBO and recurrence of serratia pneumonia.   SBO - resolved Hypokalemia Hypomagnesemia Continue K & Mg as needed. PRNs available for constipation, current not needing per nursing. Having sensation of tube feeds being too much.  - remove flexiseal tomorrow once bm decrease   - discuss decreasing TF with dietary - keep K >4, Mg >2  Hypotension Exact cause unclear, possibly ongoing insensible losses from bowel movements although he is only on prns now. Off neo yesterday after starting po TF boluses. Still requiring midodrine 5 mg tid.   - cont. q6h 200 cc/TF bolus  - cont. Midodrine 5 mg tid   Serratia Pneumonia  Vent Dependent Respiratory Failure Completed antibiotic therapy today. Xr overnight with  left lung base patchy opacity significant for atalectasis vs. Pneumonia. He has been treated with 10 days of broad spectrum antibiotics for serratia pneumonia and is at his baseline respiratory status. Will have low threshold to restart if signs of recurrence of pneumonia but no symptoms at this time.  - complete abx today - f/u AFB & fungal cultures - daily trach trials per PCCM - cont. Chest physiotherapy as tolerated  - cont. PPI   New Onset A. Fib PVCs Atrial fibrillation resolved. 12.5 mg metoprolol started with intermittent PVC burden and low sinus tachycardia. CHADsVAsC score 1.  - holding metoprolol with soft blood pressures  VTE: lovenox IVF: none Diet: tube feeds Code: full  Dispo: Anticipated discharge in approximately pending placement.   12/05/19, DO 10/29/2019, 6:11 AM Pager: [phone]

## 2019-10-30 DIAGNOSIS — I4891 Unspecified atrial fibrillation: Secondary | ICD-10-CM

## 2019-10-30 LAB — CBC
HCT: 36.8 % — ABNORMAL LOW (ref 39.0–52.0)
Hemoglobin: 11.5 g/dL — ABNORMAL LOW (ref 13.0–17.0)
MCH: 30.7 pg (ref 26.0–34.0)
MCHC: 31.3 g/dL (ref 30.0–36.0)
MCV: 98.1 fL (ref 80.0–100.0)
Platelets: 268 10*3/uL (ref 150–400)
RBC: 3.75 MIL/uL — ABNORMAL LOW (ref 4.22–5.81)
RDW: 14.1 % (ref 11.5–15.5)
WBC: 15.8 10*3/uL — ABNORMAL HIGH (ref 4.0–10.5)
nRBC: 0 % (ref 0.0–0.2)

## 2019-10-30 LAB — BASIC METABOLIC PANEL
Anion gap: 7 (ref 5–15)
BUN: 15 mg/dL (ref 8–23)
CO2: 27 mmol/L (ref 22–32)
Calcium: 8.7 mg/dL — ABNORMAL LOW (ref 8.9–10.3)
Chloride: 110 mmol/L (ref 98–111)
Creatinine, Ser: 0.3 mg/dL — ABNORMAL LOW (ref 0.61–1.24)
GFR calc Af Amer: >60 mL/min (ref >60)
GFR calc non Af Amer: >60 mL/min (ref >60)
Glucose, Bld: 97 mg/dL (ref 70–99)
Potassium: 4.6 mmol/L (ref 3.5–5.1)
Sodium: 144 mmol/L (ref 135–145)

## 2019-10-30 LAB — GLUCOSE, CAPILLARY
Glucose-Capillary: 87 mg/dL (ref 70–99)
Glucose-Capillary: 88 mg/dL (ref 70–99)
Glucose-Capillary: 92 mg/dL (ref 70–99)
Glucose-Capillary: 94 mg/dL (ref 70–99)
Glucose-Capillary: 96 mg/dL (ref 70–99)
Glucose-Capillary: 99 mg/dL (ref 70–99)

## 2019-10-30 LAB — MAGNESIUM: Magnesium: 1.8 mg/dL (ref 1.7–2.4)

## 2019-10-30 MED ORDER — PRO-STAT SUGAR FREE PO LIQD
30.0000 mL | Freq: Every day | ORAL | Status: DC
  Administered 2019-10-30 – 2019-11-18 (×94): 30 mL
  Filled 2019-10-30 (×86): qty 30

## 2019-10-30 MED ORDER — ADULT MULTIVITAMIN W/MINERALS CH
1.0000 | ORAL_TABLET | Freq: Every day | ORAL | Status: DC
  Administered 2019-10-30: 1

## 2019-10-30 MED ORDER — MAGNESIUM SULFATE 2 GM/50ML IV SOLN
2.0000 g | Freq: Once | INTRAVENOUS | Status: AC
  Administered 2019-10-30: 2 g via INTRAVENOUS
  Filled 2019-10-30: qty 50

## 2019-10-30 MED ORDER — VITAL 1.5 CAL PO LIQD
1000.0000 mL | ORAL | Status: DC
  Administered 2019-10-30 – 2019-11-17 (×12): 1000 mL
  Filled 2019-10-30 (×22): qty 1000

## 2019-10-30 MED ORDER — FREE WATER: 250.0000 mL | Freq: Four times a day (QID) | Status: DC

## 2019-10-30 MED ORDER — FREE WATER
250.0000 mL | Status: DC
  Administered 2019-10-30: 250 mL

## 2019-10-30 MED ORDER — FREE WATER
250.0000 mL | Status: DC
  Administered 2019-10-30 – 2019-11-03 (×21): 250 mL

## 2019-10-30 NOTE — Plan of Care (Signed)

## 2019-10-30 NOTE — Progress Notes (Signed)
SLP Cancellation Note  Patient Details Name: Brady [name] MRN: [id number] DOB: 26-Jan-1956   Cancelled treatment:       Reason Eval/Treat Not Completed: Medical issues which prohibited therapy(Pt was on ATC for some hours this morning but is now back on the vent. SLP will follow up for PMV evaluation and swallow eval as clinically indicated.)  Demarious Kapur I. Vear Clock, MS, CCC-SLP Acute Rehabilitation Services Office number [phone] Pager [phone]  Scheryl Marten 10/30/2019, 1:48 PM

## 2019-10-30 NOTE — Progress Notes (Signed)
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  This improved with IV fluids.  Imaging right lower lobe consolidation.  Admit to progressive care with PCCM consult for vent management  Past Medical History  Spinal stenosis, hepatitis C, hypertension, chronic respiratory failure trach and vent dependent  Significant Hospital Events   3/19-admit to progressive care  Consults:  PCCM General surgery  Procedures:   Significant Diagnostic Tests:  3/19 CT abdomen pelvis>>Mildly dilated fluid-filled loops of proximal and mid small bowel with transition to decompressed distal and terminal ileum suggestive of low-grade bowel obstruction 3/19 CT chest>>Complete collapse of the RLL secondary to aspirated debris within the RLL bronchus. Consolidation in the posterior segments of the RUL concerning for aspiration.Cholelithiasis with questionable gallbladder wall thickening. 3/19-3/20: significant stool burden on CT and KUB (personal interpretation) 3/23> Echo: LVEF 55-60% without RWMA and with trivial mitral valve regurgitation  Micro Data:  3/19 SARS-Covid-2>> negative 3/19 urine culture>> E. Faecalis  3/19 blood cultures x2>> negative x5 days 3/20 respiratory culture >> Serratia marcescens, R-cefazolin  Antimicrobials:  Cefepime 3/19>>3/25 Flagyl 3/19 Vancomycin 3/19>3/21 Bactrim 3/26>>  Interim history/subjective:  Continues to clear out stool spontaneously without further laxative.  Has been tolerating trach collar this morning.   Objective   Blood pressure 107/60, pulse (!) 101, temperature 98.3 F (36.8 C),  resp. rate (!) 21, height 5\' 8"  (1.727 m), weight 132 kg, SpO2 97 %.    Vent Mode: PRVC FiO2 (%):  [35 %-40 %] 35 % Set Rate:  [12 bmp] 12 bmp Vt Set:  [500 mL] 500 mL PEEP:  [5 cmH20] 5 cmH20 Pressure Support:  [10 cmH20] 10 cmH20 Plateau Pressure:  [14 cmH20-16 cmH20] 14 cmH20   Intake/Output Summary (Last 24 hours) at 10/30/2019 0836 Last data filed at 10/30/2019 0600 Gross per 24 hour  Intake 3387.57 ml  Output 2040 ml  Net 1347.57 ml   Filed Weights   10/27/19 0500 10/28/19 0500 10/30/19 0618  Weight: 127 kg 129 kg 132 kg   General: Chronically ill-appearing male in no acute distress HEENT: MM pink/moist, trach in place. Poor dentition.  Neuro: Awake and responsive. No obvious cranial nerve deficits; sensation grossly intact in bilateral upper and lower extremities; strength 1/5 in bilateral lower extremities CV: s1s2 present, RRR, no m/r/g PULM: Chest clear throughout. GI: nondistended, nontender, soft, bsx4 active. Continues to have light brown stools  Extremities: no edema appreciated; warm, dry Skin: chronic venous stasis changes of bilateral lower extremities  Resolved Hospital Problem list    Assessment & Plan:  Right lower lobe collapse, chronic trach and vent dependent Serratia PNA Likely multifactorial in setting of chronic ventilation, mucous plugging and aspiration event, history of Serratia and Pseudomonas pneumonia. Have not been able to wean at [facility] or during hospitalization. He has been treated with 10 days of broad spectrum antibiotics for serratia pneumonia and is able to trach collar for few hours at a time.  - Chest physiotherapy as tolerated  - Trach collar twice daily as tolerated - SLP evaluation as able   Ileus secondary to severe constipation with overflow diarrhea.  Patient admitted for SBO, treated with aggressive bowel regimen. Patient having  BM's and passing flatus. He has been tolerating full feeds at this point. KUB on 3/25 appears improved  from prior imaging. Patient is having multiple bowel movements following aggressive bowel regimen. At this time, will titrate for goal of 1-2 BM per day.  - Continue bowel regimen  - Continue full feeds as tolerated - Monitor and replete electrolytes as needed for K>4, Mg >2  Hypotension Unclear etiology; however may be secondary to ongoing GI losses. Patient currently off vasopressor support and holding metoprolol for A.fib. - Continue midodrine 5mg  tid  Best practice:  Diet: Advance tube feeds as tolerated Pain/Anxiety/Delirium protocol (if indicated): vicodin  VAP protocol (if indicated): Head of bed 30 degrees DVT prophylaxis: Lovenox GI prophylaxis: PPI Glucose control: N/A Mobility: Bedrest Code Status: Full code Family Communication: Per primary Disposition: ICU  Labs   CBC: Recent Labs  Lab 10/28/19 0253 10/29/19 0122 10/30/19 0510  WBC 14.5* 11.5* 15.8*  HGB 11.9* 12.3* 11.5*  HCT 38.7* 39.4 36.8*  MCV 99.0 97.5 98.1  PLT 230 252 268    Basic Metabolic Panel: Recent Labs  Lab 10/24/19 1247 10/24/19 1247 10/25/19 0211 10/25/19 1938 10/26/19 0955 10/27/19 0155 10/28/19 0253 10/29/19 0122 10/30/19 0510  NA 135   < > 141   < > 142 145 146* 146* 144  K 3.1*   < > 2.7*   < > 3.5 3.5 3.5 3.1* 4.6  CL 100   < > 105   < > 111 115* 113* 112* 110  CO2 23   < > 25   < > 25 24 24 27 27   GLUCOSE 83   < > 94   < > 105* 103* 87 96 97  BUN 6*   < > 9   < > 14 14 12 12 15   CREATININE <0.30*   < > 0.31*   < > 0.30* 0.33* 0.31* 0.34* 0.30*  CALCIUM 8.5*   < > 8.8*   < > 8.4* 8.4* 8.8* 9.1 8.7*  MG 1.7   < > 1.7  --  1.8 1.9 1.8 1.6* 1.8  PHOS 3.8  --  3.8  --   --   --   --   --   --    < > = values in this interval not displayed.   GFR: Estimated Creatinine Clearance: 123.8 mL/min (A) (by C-G formula based on SCr of 0.3 mg/dL (L)). Recent Labs  Lab 10/28/19 0253 10/29/19 0122 10/30/19 0510  WBC 14.5* 11.5* 15.8*    Liver Function Tests: Recent Labs  Lab  10/29/19 0122  AST 15  ALT 16  ALKPHOS 98  BILITOT 0.6  PROT 6.4*  ALBUMIN 2.2*   No results for input(s): LIPASE, AMYLASE in the last 168 hours. No results for input(s): AMMONIA in the last 168 hours.  ABG    Component Value Date/Time   PHART 7.505 (H) 10/21/2019 0519   PCO2ART 38.7 10/21/2019 0519   PO2ART 77.0 (L) 10/21/2019 0519   HCO3 30.5 (H) 10/21/2019 0519   TCO2 32 10/21/2019 0519   O2SAT 96.0 10/21/2019 0519     Coagulation Profile: No results for input(s): INR, PROTIME in the last 168 hours.  Cardiac Enzymes: No results for input(s): CKTOTAL, CKMB, CKMBINDEX, TROPONINI in the last 168 hours.  HbA1C: No results found for: HGBA1C  CBG: Recent Labs  Lab 10/29/19 1518 10/29/19 1954 10/29/19 2324 10/30/19 0317 10/30/19 0718  GLUCAP 90 86 90 88 87      Gorge Almanza  Sereen Schaff, MD Internal Medicine, PGY-1  10/30/2019, 8:36 AM

## 2019-10-30 NOTE — TOC Progression Note (Addendum)
Transition of Care [facility]) - Progression Note    Patient Details  Name: Brady [name] MRN: [id number] Date of Birth: [birth year]  Transition of Care [facility]) CM/SW Contact  Nonda Lou, [place] Phone Number: 10/30/2019, 11:06 AM  Clinical Narrative:    Update: CSW was contacted by [facility]. Informed patient's referral is still under review.  CSW confirmed [facility] referral received. Referral will be reviewed.   11a: CSW made telephone call to [facility] to follow-up on referral sent on 3/25 and additional clinicals that were faxed on 3/26. CSW left a voicemail requesting a callback.  CSW attempted contact with [facility] and spoke with admissions liaison. CSW requested a valid fax number and was provided [phone]. CSW faxed referral to [facility] and will follow-up. CSW will continue to follow and assist with discharge planning needs.     Expected Discharge Plan and Services                                                 Social Determinants of Health (SDOH) Interventions    Readmission Risk Interventions No flowsheet data found.

## 2019-10-30 NOTE — Progress Notes (Signed)
Nutrition Follow-up  DOCUMENTATION CODES:   Morbid obesity  INTERVENTION:   Tube Feeding: Change to Vital 1.5 at 40 ml/hr Pro-Stat 30 mL 5 times daily Provides 1940 kcals, 140 g of protein and 730 mL of free water  Increase free water to 250 mL q 4 hours Total free water from TF and flushes: 2230 mL   Add MVI with Minerals  NUTRITION DIAGNOSIS:   Inadequate oral intake related to inability to eat as evidenced by NPO status.  Being addressed via TF   GOAL:   Patient will meet greater than or equal to 90% of their needs  Progressing  MONITOR:   TF tolerance, Diet advancement, Labs, Weight trends, Skin  REASON FOR ASSESSMENT:   Consult Enteral/tube feeding initiation and management  ASSESSMENT:   64 yo male admitted with RLL collapse with aspiration and mucous plugging, ileus secondary to severe constipation. PMH includes spinal stenosis, hepatitis C, HTN, chronic respiratory failure with trach on vent support with PEG tube since 09/2019  3/19 Admitted, CT abdomen suggestive of low grade bowel obstruction 3/20 Bronch: chipped tooth (3 mm x 5 mm) removed from RLL, thick, purulent secretions removed from RML  Tolerating trach collar trials. SLP following  Pt has been tolerating TF. Vital High Protein at 65 ml/hr Received consult re: TF regarding pt wanting to reduce TF rate. Pt feeling "full" from tF Plan to change to more concentrated formula which can infuse at a lower rate but still provide adequate nutrition  Free water flush 200 mL q 4 hours  Pt now having multiple BMs per day; noted bowel regimen being adjusted  Labs: reviewed Meds: reviewed   Diet Order:   Diet Order            Diet NPO time specified  Diet effective now              EDUCATION NEEDS:   Not appropriate for education at this time  Skin:  Skin Assessment: Skin Integrity Issues: Skin Integrity Issues:: Other (Comment) Other: MASD to bilateral buttock  Last BM:   3/23  Height:   Ht Readings from Last 1 Encounters:  11-17-2019 5\' 8"  (1.727 m)    Weight:   Wt Readings from Last 1 Encounters:  10/30/19 132 kg    Ideal Body Weight:  70 kg  BMI:  Body mass index is 44.25 kg/m.  Estimated Nutritional Needs:   Kcal:  1550-1750 kcals  Protein:  140-175  Fluid:  >/= 2 L    11/01/19 MS, RDN, LDN, CNSC RD Pager Number and Weekend/On-Call After Hours Pager Located in [place]

## 2019-10-30 NOTE — Progress Notes (Signed)
Patient taken off vent and placed on 40% ATC. Patient suctioned before and inner cannula changed. Patient tolerating well and vitals are stable. RN made aware.

## 2019-10-30 NOTE — Progress Notes (Signed)
   Subjective: Pt seen at the bedside this morning. Denies abdominal pain, though still having a full sensation with the tube feeds. Pt continues to have some anxiety and associated shortness of breath intermittently.   Objective:  Vital signs in last 24 hours: Vitals:   10/29/19 2100 10/29/19 2300 10/30/19 0114 10/30/19 0300  BP: (!) 117/53  113/63   Pulse: 96     Resp: 20  15   Temp:  97.9 F (36.6 C)  98 F (36.7 C)  TempSrc:  Oral  Oral  SpO2: 94%  95%   Weight:      Height:       Physical Exam Vitals and nursing note reviewed.  Constitutional:      General: He is not in acute distress.    Appearance: He is ill-appearing (chronically).  Neck:     Comments: Trach in place. Pulmonary:     Effort: Pulmonary effort is normal. No tachypnea.  Abdominal:     General: Abdomen is flat. Bowel sounds are normal.     Palpations: Abdomen is soft.     Tenderness: There is no abdominal tenderness.  Neurological:     Mental Status: He is alert.    Assessment/Plan:  Active Problems:   Hypotension   Lactic acidosis   Small bowel obstruction (HCC)   VAP (ventilator-associated pneumonia) (HCC)   Acute on chronic respiratory failure (HCC)   S/P bronchoscopy   Pressure injury of sacral region, stage 1  64 yo male with PMH of vent dependent respiratory failure 2/2 pneumonia in Feb 2021, HLD, atypical depression, COPD, chronic low back pain, PEG tube placement 10/05/19 presenting from [facility] with SBO and recurrence of serratia pneumonia.   SBO - resolved Hypokalemia Hypomagnesemia Pt with overflow diarrhea after aggressive bowel regimen following resolution of SBO. PRNs available for constipation, though not needing. - pt with over 2L stool output yesterday - rectal tube in place, remove when BMs decrease - replete K and Mg as needed - follow-up with dietitian regarding TF regimen  Hypotension Exact cause unclear, possibly ongoing insensible losses from bowel movements. Pt off  pressure support. BP seem to be improving. BP this AM 107/60. - on midodrine 5mg  TID - q4h 200cc free water bolus  Serratia Pneumonia  Vent Dependent Respiratory Failure Treated with 10 days of broad spectrum antibiotics for serratia pneumonia and is at his baseline respiratory status.  PCCM consulted - pt has been able to tolerate trach collar for a few hours at a time - chest physiotherapy as tolerated - trach collar twice daily as tolerated - SLP evaluation as able  New Onset A. Fib - resolved PVCs Pt in sinus. CHADsVAsC score 1. Cardiology consulted, recommended low dose beta blocker with no additional work-up.  - now holding metoprolol due to hypotension as above  Prior to Admission Living Arrangement: [facility] Anticipated Discharge Location: [place], [facility] Barriers to Discharge: placement process Dispo: Pt is medically stable for discharge. Currently awaiting referrals. Anticipated discharge in approximately 1-2 day(s).   [facility], MD 10/30/2019, 6:02 AM Pager: [phone]

## 2019-10-31 LAB — CBC
HCT: 34.8 % — ABNORMAL LOW (ref 39.0–52.0)
Hemoglobin: 11 g/dL — ABNORMAL LOW (ref 13.0–17.0)
MCH: 30.7 pg (ref 26.0–34.0)
MCHC: 31.6 g/dL (ref 30.0–36.0)
MCV: 97.2 fL (ref 80.0–100.0)
Platelets: 270 10*3/uL (ref 150–400)
RBC: 3.58 MIL/uL — ABNORMAL LOW (ref 4.22–5.81)
RDW: 14.2 % (ref 11.5–15.5)
WBC: 16.4 10*3/uL — ABNORMAL HIGH (ref 4.0–10.5)
nRBC: 0 % (ref 0.0–0.2)

## 2019-10-31 LAB — BASIC METABOLIC PANEL
Anion gap: 7 (ref 5–15)
BUN: 18 mg/dL (ref 8–23)
CO2: 29 mmol/L (ref 22–32)
Calcium: 8.6 mg/dL — ABNORMAL LOW (ref 8.9–10.3)
Chloride: 102 mmol/L (ref 98–111)
Creatinine, Ser: 0.35 mg/dL — ABNORMAL LOW (ref 0.61–1.24)
GFR calc Af Amer: >60 mL/min (ref >60)
GFR calc non Af Amer: >60 mL/min (ref >60)
Glucose, Bld: 90 mg/dL (ref 70–99)
Potassium: 4.2 mmol/L (ref 3.5–5.1)
Sodium: 138 mmol/L (ref 135–145)

## 2019-10-31 LAB — GLUCOSE, CAPILLARY
Glucose-Capillary: 91 mg/dL (ref 70–99)
Glucose-Capillary: 86 mg/dL (ref 70–99)
Glucose-Capillary: 88 mg/dL (ref 70–99)

## 2019-10-31 LAB — MAGNESIUM: Magnesium: 1.8 mg/dL (ref 1.7–2.4)

## 2019-10-31 MED ORDER — MAGNESIUM SULFATE 2 GM/50ML IV SOLN
2.0000 g | Freq: Once | INTRAVENOUS | Status: AC
  Administered 2019-10-31: 2 g via INTRAVENOUS
  Filled 2019-10-31: qty 50

## 2019-10-31 MED ORDER — PANTOPRAZOLE SODIUM 40 MG PO PACK
40.0000 mg | PACK | Freq: Every day | ORAL | Status: DC
  Administered 2019-10-31 – 2019-11-18 (×19): 40 mg
  Filled 2019-10-31 (×19): qty 20

## 2019-10-31 MED ORDER — ADULT MULTIVITAMIN LIQUID CH
15.0000 mL | Freq: Every day | ORAL | Status: DC
  Administered 2019-10-31 – 2019-11-02 (×3): 15 mL via ORAL
  Filled 2019-10-31 (×2): qty 15

## 2019-10-31 NOTE — Progress Notes (Signed)
Physical Therapy Treatment Patient Details Name: Brady [name] MRN: [id number] DOB: 05/13/1956 Today's Date: 10/31/2019    History of Present Illness Pt is a 64 yo male s/p ileus/SBO. Pt vented with trach since Jan 2021. PMHx; pt reports 12 spincal surgeries, Spinal stenosis, hepatitis C, hypertension, chronic respiratory failure trach and vent dependent.    PT Comments    Pt making slow progress. Was able to tolerate sitting EOB.    Follow Up Recommendations  SNF;LTACH     Equipment Recommendations  Other (comment)(to be determined)    Recommendations for Other Services       Precautions / Restrictions Precautions Precautions: Fall;Other (comment) Precaution Comments: trach, vent    Mobility  Bed Mobility Overal bed mobility: Needs Assistance Bed Mobility: Supine to Sit;Sit to Supine     Supine to sit: +2 for physical assistance;Max assist Sit to supine: +2 for physical assistance;Max assist   General bed mobility comments: Assist to bring legs off of bed, elevate trunk into sitting and bring hips to EOB. Assist to lower trunk and bring feet back up into bed returning to supine.  Transfers                    Ambulation/Gait                 Stairs             Wheelchair Mobility    Modified Rankin (Stroke Patients Only)       Balance Overall balance assessment: Needs assistance Sitting-balance support: Bilateral upper extremity supported Sitting balance-Leahy Scale: Poor Sitting balance - Comments: Pt sat EOB x 8 minutes with min to mod assist  Postural control: Posterior lean;Right lateral lean                                  Cognition Arousal/Alertness: Awake/alert Behavior During Therapy: WFL for tasks assessed/performed Overall Cognitive Status: Difficult to assess                                 General Comments: Pt mouthing words but difficult to understand      Exercises      General  Comments General comments (skin integrity, edema, etc.): VSS on vent      Pertinent Vitals/Pain Pain Assessment: Faces Faces Pain Scale: Hurts little more Pain Descriptors / Indicators: Discomfort;Tender Pain Intervention(s): Monitored during session;Repositioned;Limited activity within patient's tolerance    Home Living                      Prior Function            PT Goals (current goals can now be found in the care plan section) Progress towards PT goals: Progressing toward goals    Frequency    Min 2X/week      PT Plan Current plan remains appropriate    Co-evaluation PT/OT/SLP Co-Evaluation/Treatment: Yes Reason for Co-Treatment: Complexity of the patient's impairments (multi-system involvement);For patient/therapist safety PT goals addressed during session: Mobility/safety with mobility;Balance        AM-PAC PT "6 Clicks" Mobility   Outcome Measure  Help needed turning from your back to your side while in a flat bed without using bedrails?: Total Help needed moving from lying on your back to sitting on the side of a flat bed without using bedrails?:  Total Help needed moving to and from a bed to a chair (including a wheelchair)?: Total Help needed standing up from a chair using your arms (e.g., wheelchair or bedside chair)?: Total Help needed to walk in hospital room?: Total Help needed climbing 3-5 steps with a railing? : Total 6 Click Score: 6    End of Session   Activity Tolerance: Patient tolerated treatment well Patient left: in bed;with call bell/phone within reach Nurse Communication: Mobility status PT Visit Diagnosis: Other abnormalities of gait and mobility (R26.89);Muscle weakness (generalized) (M62.81)     Time: 3524-8185 PT Time Calculation (min) (ACUTE ONLY): 23 min  Charges:  $Therapeutic Activity: 8-22 mins                     [facility] PT Acute Rehabilitation Services Pager [phone] Office [phone]    [facility] 10/31/2019, 3:29 PM

## 2019-10-31 NOTE — Progress Notes (Signed)
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  This improved with IV fluids.  Imaging right lower lobe consolidation.  Admit to progressive care with PCCM consult for vent management  Past Medical History  Spinal stenosis, hepatitis C, hypertension, chronic respiratory failure trach and vent dependent  Significant Hospital Events   3/19-admit to progressive care  Consults:  PCCM General surgery  Procedures:   Significant Diagnostic Tests:  3/19 CT abdomen pelvis>>Mildly dilated fluid-filled loops of proximal and mid small bowel with transition to decompressed distal and terminal ileum suggestive of low-grade bowel obstruction 3/19 CT chest>>Complete collapse of the RLL secondary to aspirated debris within the RLL bronchus. Consolidation in the posterior segments of the RUL concerning for aspiration.Cholelithiasis with questionable gallbladder wall thickening. 3/19-3/20: significant stool burden on CT and KUB (personal interpretation) 3/23> Echo: LVEF 55-60% without RWMA and with trivial mitral valve regurgitation  Micro Data:  3/19 SARS-Covid-2>> negative 3/19 urine culture>> E. Faecalis  3/19 blood cultures x2>> negative x5 days 3/20 respiratory culture >> Serratia marcescens, R-cefazolin 3/29 blood cultures x2 >>  Antimicrobials:  Cefepime 3/19>>3/25 Flagyl 3/19 Vancomycin 3/19>3/21 Bactrim 3/26>>3/28  Interim history/subjective:  Continues to clear out stool spontaneously without further bowel regimen. Patient able to tolerate trach collar yesterday for >2 hours.   Objective   Blood pressure  108/69, pulse 94, temperature 98 F (36.7 C), temperature source Oral, resp. rate 15, height 5\' 8"  (1.727 m), weight 133 kg, SpO2 93 %.    Vent Mode: PSV;CPAP FiO2 (%):  [35 %-60 %] 40 % Set Rate:  [12 bmp] 12 bmp Vt Set:  [500 mL] 500 mL PEEP:  [5 cmH20] 5 cmH20 Pressure Support:  [10 cmH20] 10 cmH20 Plateau Pressure:  [15 cmH20-19 cmH20] 15 cmH20   Intake/Output Summary (Last 24 hours) at 10/31/2019 0750 Last data filed at 10/31/2019 0600 Gross per 24 hour  Intake 2797.5 ml  Output 1650 ml  Net 1147.5 ml   Filed Weights   10/28/19 0500 10/30/19 0618 10/31/19 0500  Weight: 129 kg 132 kg 133 kg   General: Chronically ill-appearing male in no acute distress HEENT: MM pink/moist, trach in place. Poor dentition.  Neuro: Awake and responsive. No obvious cranial nerve deficits; sensation grossly intact in bilateral upper and lower extremities; strength 1/5 in bilateral lower extremities CV: s1s2 present, RRR, no m/r/g PULM: Chest clear throughout. GI: nondistended, nontender, soft, bsx4 active. Continues to have light brown stools  Extremities: no edema appreciated; warm, dry Skin: chronic venous stasis changes of bilateral lower extremities  Resolved Hospital Problem list   SBO Assessment & Plan:  Right lower lobe collapse, chronic trach and vent dependent respiratory failure Serratia PNA Likely multifactorial in setting of chronic ventilation, mucous plugging and aspiration event, history of Serratia and Pseudomonas pneumonia.He has been treated with 10 days of broad spectrum antibiotics for serratia pneumonia. Tolerating trach collar trials  - Chest physiotherapy  - Trach collar twice daily as tolerated - SLP evaluation as able   Ileus secondary to severe constipation with overflow diarrhea  Patient admitted for SBO, treated with aggressive bowel regimen. Patient having BM's and passing flatus. He has been tolerating full feeds at  this point. KUB on 3/25 appears improved from  prior imaging. Patient is having multiple bowel movements following aggressive bowel regimen. At this time, will titrate for goal of 1-2 BM per day.  - Continuing with bowel regimen prn - Continue full feeds as tolerated - Monitor and replete electrolytes as needed for K>4, Mg >2  Hypotension Unclear etiology; however may be secondary to ongoing GI losses. Patient currently off vasopressor support and holding metoprolol for A.fib. - Continue midodrine 5mg  tid - Free water q4h per primary  Best practice:  Diet: Full tube feeds per RD Pain/Anxiety/Delirium protocol (if indicated): vicodin  VAP protocol (if indicated): Head of bed 30 degrees DVT prophylaxis: Lovenox GI prophylaxis: PPI Glucose control: N/A Mobility: Bedrest Code Status: Full code Family Communication: Per primary Disposition: ICU, awaiting placement   Labs   CBC: Recent Labs  Lab 10/28/19 0253 10/29/19 0122 10/30/19 0510 10/31/19 0303  WBC 14.5* 11.5* 15.8* 16.4*  HGB 11.9* 12.3* 11.5* 11.0*  HCT 38.7* 39.4 36.8* 34.8*  MCV 99.0 97.5 98.1 97.2  PLT 230 252 268 270    Basic Metabolic Panel: Recent Labs  Lab 10/24/19 1247 10/24/19 1247 10/25/19 0211 10/25/19 1938 10/27/19 0155 10/28/19 0253 10/29/19 0122 10/30/19 0510 10/31/19 0303  NA 135   < > 141   < > 145 146* 146* 144 138  K 3.1*   < > 2.7*   < > 3.5 3.5 3.1* 4.6 4.2  CL 100   < > 105   < > 115* 113* 112* 110 102  CO2 23   < > 25   < > 24 24 27 27 29   GLUCOSE 83   < > 94   < > 103* 87 96 97 90  BUN 6*   < > 9   < > 14 12 12 15 18   CREATININE <0.30*   < > 0.31*   < > 0.33* 0.31* 0.34* 0.30* 0.35*  CALCIUM 8.5*   < > 8.8*   < > 8.4* 8.8* 9.1 8.7* 8.6*  MG 1.7   < > 1.7   < > 1.9 1.8 1.6* 1.8 1.8  PHOS 3.8  --  3.8  --   --   --   --   --   --    < > = values in this interval not displayed.   GFR: Estimated Creatinine Clearance: 124.3 mL/min (A) (by C-G formula based on SCr of 0.35 mg/dL (L)). Recent Labs  Lab 10/28/19 0253 10/29/19  0122 10/30/19 0510 10/31/19 0303  WBC 14.5* 11.5* 15.8* 16.4*    Liver Function Tests: Recent Labs  Lab 10/29/19 0122  AST 15  ALT 16  ALKPHOS 98  BILITOT 0.6  PROT 6.4*  ALBUMIN 2.2*   No results for input(s): LIPASE, AMYLASE in the last 168 hours. No results for input(s): AMMONIA in the last 168 hours.  ABG    Component Value Date/Time   PHART 7.505 (H) 10/21/2019 0519   PCO2ART 38.7 10/21/2019 0519   PO2ART 77.0 (L) 10/21/2019 0519   HCO3 30.5 (H) 10/21/2019 0519   TCO2 32 10/21/2019 0519   O2SAT 96.0 10/21/2019 0519     Coagulation Profile: No results for input(s): INR, PROTIME in the last 168 hours.  Cardiac Enzymes: No results for input(s): CKTOTAL, CKMB, CKMBINDEX, TROPONINI in the last 168 hours.  HbA1C: No results found for: HGBA1C  CBG: Recent Labs  Lab 10/30/19 1503 10/30/19 1932 10/30/19 2336 10/31/19 0326 10/31/19 0733  GLUCAP  99 94 96 91 86    CRITICAL CARE TIME:  Eliezer Bottom, MD Internal Medicine, PGY-1  10/31/2019, 7:50 AM

## 2019-10-31 NOTE — Progress Notes (Signed)
   Subjective: Pt notes improvement in abdominal discomfort with feedings change  Objective:  Vital signs in last 24 hours: Vitals:   10/31/19 0734 10/31/19 0800 10/31/19 0900 10/31/19 1109  BP:  (!) 147/120 (!) 93/51   Pulse:  87 95   Resp:  (!) 27 19   Temp: 98 F (36.7 C)   98.3 F (36.8 C)  TempSrc: Oral   Oral  SpO2:  97% (!) 88%   Weight:      Height:       Physical exam: General: in NAD HEENT: tracheostomy present Cardio: tachycardic rate. Weak pedal pulses Pulm: diffuse rhonchi. No wheezes Abd: nontender   Assessment/Plan:  Active Problems:   Hypotension   Lactic acidosis   Small bowel obstruction (HCC)   VAP (ventilator-associated pneumonia) (HCC)   Acute on chronic respiratory failure (HCC)   S/P bronchoscopy   Pressure injury of sacral region, stage 1  64 yo male with PMH of vent dependent respiratory failure 2/2 pneumonia in Feb 2021, HLD, atypical depression, COPD, chronic low back pain, PEG tube placement 10/05/19 presenting from [facility] with SBO and recurrence of serratia pneumonia.   SBO - resolved Hypokalemia Hypomagnesemia Pt with overflow diarrhea after aggressive bowel regimen following resolution of SBO--slowly improving.  Tube feeds changed 3/29. Rate decreased to 40cc/hr - 500cc in stool output yesterday so slowly improving - rectal tube in place, remove when BMs decrease - replete K and Mg as needed  Hypotension. Pressures stable. Continue midodrine and free water boluses  Chronic Vent Dependent Respiratory Failure. Appreciate PCCM's assistance with weaning to trach collar. Doubtful that it will occur over this hospitalization.  Prior to Admission Living Arrangement: [facility] Anticipated Discharge Location: [place] [place] [facility] Barriers to Discharge: placement process Dispo: Pt is medically stable for discharge pending bed placement  Elige Radon, MD 10/31/2019, 2:14 PM Pager: [phone]

## 2019-10-31 NOTE — Progress Notes (Addendum)
Occupational Therapy Treatment Patient Details Name: Brady [name] MRN: [id number] DOB: 1955-11-05 Today's Date: 10/31/2019    History of present illness Pt is a 64 yo male s/p ileus/SBO. Pt vented with trach since Jan 2021. PMHx; pt reports 12 spincal surgeries, Spinal stenosis, hepatitis C, hypertension, chronic respiratory failure trach and vent dependent.   OT comments  Pt progressing to EOB sitting for trunk stability and balance. Pt maxA+2 for bed mobility and modA overall due to posterior and lateral lean in sitting with moderate cues to correct posture. Pt continues to have decreased strength in BUEs and requiring near totalA for ADL. Pt would greatly benefit from continued OT skilled services. OT following acutely.  SpO2 levels >90% on 40% FiO2 on 5 PEEP. VSS at EOB.   Follow Up Recommendations  SNF;LTACH    Equipment Recommendations  Other (comment)(defer to next setting)    Recommendations for Other Services      Precautions / Restrictions Precautions Precautions: Fall;Other (comment) Precaution Comments: trach, vent Restrictions Weight Bearing Restrictions: No       Mobility Bed Mobility Overal bed mobility: Needs Assistance Bed Mobility: Supine to Sit;Sit to Supine     Supine to sit: +2 for physical assistance;Max assist Sit to supine: +2 for physical assistance;Max assist   General bed mobility comments: Assist to bring legs off of bed, elevate trunk into sitting and bring hips to EOB. Assist to lower trunk and bring feet back up into bed returning to supine.  Transfers                 General transfer comment: deferred as pt has not been OOB at [facility] since Jan 2021.     Balance Overall balance assessment: Needs assistance Sitting-balance support: Bilateral upper extremity supported Sitting balance-Leahy Scale: Poor Sitting balance - Comments: Pt sat EOB x 8 minutes with min to mod assist  Postural control: Posterior lean;Right lateral lean                                  ADL either performed or assessed with clinical judgement   ADL Overall ADL's : Needs assistance/impaired Eating/Feeding: NPO                                   Functional mobility during ADLs: Maximal assistance;+2 for physical assistance General ADL Comments: Pt denies need to perform grooming when seated EOB. Pt with very weak RUE and LUE encouraged to perform reaching tasks.     Vision   Vision Assessment?: No apparent visual deficits Additional Comments: wears glasses occasionally   Perception     Praxis      Cognition Arousal/Alertness: Awake/alert Behavior During Therapy: WFL for tasks assessed/performed Overall Cognitive Status: Difficult to assess                                 General Comments: Pt mouthing words but difficult to understand        Exercises     Shoulder Instructions       General Comments VSS on vent 40% FiO2, 5 PEEP~ 100% O2 with activity     Pertinent Vitals/ Pain       Pain Assessment: Faces Faces Pain Scale: Hurts little more Pain Descriptors / Indicators: Discomfort;Tender Pain Intervention(s): Monitored during session  Home Living                                          Prior Functioning/Environment              Frequency  Min 2X/week        Progress Toward Goals  OT Goals(current goals can now be found in the care plan section)  Progress towards OT goals: Progressing toward goals  Acute Rehab OT Goals Patient Stated Goal: find another rehab facility OT Goal Formulation: With patient Time For Goal Achievement: 11/06/19 Potential to Achieve Goals: Good ADL Goals Pt Will Perform Grooming: with min guard assist;sitting Pt Will Perform Upper Body Dressing: with min assist;sitting Pt/caregiver will Perform Home Exercise Program: Increased strength;Right Upper extremity;Left upper extremity;With Supervision Additional ADL Goal  #1: Pt will increase to modA for bed mobility as precursor for OOB ADL tasks. Additional ADL Goal #2: Pt will tolerate sitting EOB x10 mins with minguardA overall in prep for OOB ADL.  Plan Discharge plan remains appropriate    Co-evaluation    PT/OT/SLP Co-Evaluation/Treatment: Yes Reason for Co-Treatment: Complexity of the patient's impairments (multi-system involvement) PT goals addressed during session: Mobility/safety with mobility;Balance OT goals addressed during session: Strengthening/ROM      AM-PAC OT "6 Clicks" Daily Activity     Outcome Measure   Help from another person eating meals?: Total Help from another person taking care of personal grooming?: A Lot Help from another person toileting, which includes using toliet, bedpan, or urinal?: Total Help from another person bathing (including washing, rinsing, drying)?: Total Help from another person to put on and taking off regular upper body clothing?: Total Help from another person to put on and taking off regular lower body clothing?: Total 6 Click Score: 7    End of Session Equipment Utilized During Treatment: Oxygen  OT Visit Diagnosis: Muscle weakness (generalized) (M62.81);Pain Pain - Right/Left: Left Pain - part of body: Leg   Activity Tolerance Patient limited by fatigue   Patient Left in bed;with call bell/phone within reach;with bed alarm set   Nurse Communication Mobility status        Time: 9741-6384 OT Time Calculation (min): 26 min  Charges: OT General Charges $OT Visit: 1 Visit OT Treatments $Therapeutic Activity: 8-22 mins  Jefferey Pica, OTR/L Acute Rehabilitation Services Pager: [phone] Office: [phone]    Fabio Wah C 10/31/2019, 4:11 PM

## 2019-11-01 LAB — CBC WITH DIFFERENTIAL/PLATELET
Abs Immature Granulocytes: 0.11 10*3/uL — ABNORMAL HIGH (ref 0.00–0.07)
Basophils Absolute: 0.1 10*3/uL (ref 0.0–0.1)
Basophils Relative: 0 %
Eosinophils Absolute: 0.1 10*3/uL (ref 0.0–0.5)
Eosinophils Relative: 0 %
HCT: 33.9 % — ABNORMAL LOW (ref 39.0–52.0)
Hemoglobin: 11.1 g/dL — ABNORMAL LOW (ref 13.0–17.0)
Immature Granulocytes: 1 %
Lymphocytes Relative: 19 %
Lymphs Abs: 2.9 10*3/uL (ref 0.7–4.0)
MCH: 31.4 pg (ref 26.0–34.0)
MCHC: 32.7 g/dL (ref 30.0–36.0)
MCV: 96 fL (ref 80.0–100.0)
Monocytes Absolute: 0.9 10*3/uL (ref 0.1–1.0)
Monocytes Relative: 6 %
Neutro Abs: 11.1 10*3/uL — ABNORMAL HIGH (ref 1.7–7.7)
Neutrophils Relative %: 74 %
Platelets: 279 10*3/uL (ref 150–400)
RBC: 3.53 MIL/uL — ABNORMAL LOW (ref 4.22–5.81)
RDW: 14.4 % (ref 11.5–15.5)
WBC: 15.2 10*3/uL — ABNORMAL HIGH (ref 4.0–10.5)
nRBC: 0 % (ref 0.0–0.2)

## 2019-11-01 LAB — MAGNESIUM: Magnesium: 1.7 mg/dL (ref 1.7–2.4)

## 2019-11-01 LAB — GLUCOSE, CAPILLARY
Glucose-Capillary: 86 mg/dL (ref 70–99)
Glucose-Capillary: 89 mg/dL (ref 70–99)

## 2019-11-01 LAB — BASIC METABOLIC PANEL
Anion gap: 7 (ref 5–15)
BUN: 14 mg/dL (ref 8–23)
CO2: 29 mmol/L (ref 22–32)
Calcium: 8.5 mg/dL — ABNORMAL LOW (ref 8.9–10.3)
Chloride: 97 mmol/L — ABNORMAL LOW (ref 98–111)
Creatinine, Ser: <0.3 mg/dL — ABNORMAL LOW (ref 0.61–1.24)
Glucose, Bld: 87 mg/dL (ref 70–99)
Potassium: 4.1 mmol/L (ref 3.5–5.1)
Sodium: 133 mmol/L — ABNORMAL LOW (ref 135–145)

## 2019-11-01 MED ORDER — MAGNESIUM SULFATE 4 GM/100ML IV SOLN
4.0000 g | Freq: Once | INTRAVENOUS | Status: AC
  Administered 2019-11-01: 4 g via INTRAVENOUS
  Filled 2019-11-01: qty 100

## 2019-11-01 NOTE — Progress Notes (Signed)
Pt placed back on full support for night resting. Pt on previous settings. Pt respiratory status is stable at this time. RT will continue to monitor.

## 2019-11-01 NOTE — TOC Progression Note (Signed)
Transition of Care [facility]) - Progression Note    Patient Details  Name: Brady [name] MRN: [id number] Date of Birth: 02-16-1956  Transition of Care [facility]) CM/SW Contact  Levada Schilling Phone Number: 11/01/2019, 4:18 PM  Clinical Narrative:    CSW left vm for each SNF facility [facility] and [facility] for bed availability.  CSW will continue to follow for disposition planning.        Expected Discharge Plan and Services                                                 Social Determinants of Health (SDOH) Interventions    Readmission Risk Interventions No flowsheet data found.

## 2019-11-01 NOTE — Progress Notes (Signed)
5mg  PO Oxycodone wasted with , RN, did not document in Pyxis.

## 2019-11-01 NOTE — Progress Notes (Signed)
   Subjective:   Brady [name] was examined and evaluated at bedside this am. He mentions having 'gas' but denies abdominal pain. Resolved abdominal "fullness" feeling after change in TF. Discussed changes to his stool output and maintaining appropriate bowel regimen after discharge. Pt does state his breathing "sucks."  Objective:  Vital signs in last 24 hours: Vitals:   11/01/19 0315 11/01/19 0400 11/01/19 0500 11/01/19 0600  BP:  111/77 97/61 (!) 103/47  Pulse: 98 99 80 87  Resp: (!) 21 19 19  (!) 26  Temp:      TempSrc:      SpO2: 96% 98% 97% 97%  Weight:      Height:       Physical Exam Vitals and nursing note reviewed.  Constitutional:      General: He is not in acute distress.    Appearance: He is ill-appearing (chronically).  Neck:     Comments: Trach in place Pulmonary:     Effort: Pulmonary effort is normal. No respiratory distress.  Abdominal:     General: Abdomen is flat. Bowel sounds are normal.     Palpations: Abdomen is soft.     Tenderness: There is no abdominal tenderness.  Neurological:     Mental Status: He is alert.    Assessment/Plan:  Active Problems:   Hypotension   Lactic acidosis   Small bowel obstruction (HCC)   VAP (ventilator-associated pneumonia) (HCC)   Acute on chronic respiratory failure (HCC)   S/P bronchoscopy   Pressure injury of sacral region, stage 1  65 yo male with PMH of vent dependent respiratory failure 2/2 pneumonia in Feb 2021, HLD, atypical depression, COPD, chronic low back pain, PEG tube placement 10/05/19 presenting from [facility] with SBO and recurrence of serratia pneumonia.   SBO - resolved Hypokalemia Hypomagnesemia Pt with overflow diarrhea after aggressive bowel regimen following resolution of SBO, this continues to improve. 12/05/19 stool output yesterday. - PRNs available for constipation with docusate, Miralax, and senokot  - abdominal symptoms improved after switch to more concentrated tube feed at lower rate -  continue TF per dietitian, currently at goal with rate of 76mL/hr - replete K and Mg as needed  Hypotension Exact etiology unclear, but pressures appear stable though soft. - continue midodrine 5mg  TID - q4h 43m free water bolus  Serratia Pneumonia  Vent Dependent Respiratory Failure 10 day course of broad spectrum antibiotics completed on 3/28. PCCM consulted - not tolerating weaning without clear physiologic signs of intolerance - continue to encourage weaning - trach collar twice daily as tolerated - chest physiotherapy - SLP evaluation as able  New Onset A. Fib - resolved Pt in sinus. CHADsVAsC score 1. Cardiology consulted, recommended low dose beta blocker with no additional work-up. Pt unable to tolerate BB due to hypotension and soft BP's.   Prior to Admission Living Arrangement: [facility] Anticipated Discharge Location: LTAC Barriers to Discharge: placement Dispo: Medically stable/cleared for discharge. Anticipated discharge pending acceptance at [facility] facility.  4/28, MD 11/01/2019, 6:06 AM Pager: [phone]

## 2019-11-01 NOTE — Progress Notes (Signed)
Patient is very adornment that he needs to have his pain medication before being placed on ATC. RN was made aware & pain medication is not due until 09:45. Patient was placed on CPAP / PS 10/5, 40% for wean.

## 2019-11-02 ENCOUNTER — Inpatient Hospital Stay: Payer: Self-pay

## 2019-11-02 LAB — CBC
HCT: 39 % (ref 39.0–52.0)
Hemoglobin: 12.5 g/dL — ABNORMAL LOW (ref 13.0–17.0)
MCH: 31.1 pg (ref 26.0–34.0)
MCHC: 32.1 g/dL (ref 30.0–36.0)
MCV: 97 fL (ref 80.0–100.0)
Platelets: 280 10*3/uL (ref 150–400)
RBC: 4.02 MIL/uL — ABNORMAL LOW (ref 4.22–5.81)
RDW: 14.3 % (ref 11.5–15.5)
WBC: 12.4 10*3/uL — ABNORMAL HIGH (ref 4.0–10.5)
nRBC: 0 % (ref 0.0–0.2)

## 2019-11-02 LAB — BASIC METABOLIC PANEL
Anion gap: 10 (ref 5–15)
BUN: 13 mg/dL (ref 8–23)
CO2: 32 mmol/L (ref 22–32)
Calcium: 9 mg/dL (ref 8.9–10.3)
Chloride: 97 mmol/L — ABNORMAL LOW (ref 98–111)
Creatinine, Ser: <0.3 mg/dL — ABNORMAL LOW (ref 0.61–1.24)
Glucose, Bld: 99 mg/dL (ref 70–99)
Potassium: 4.6 mmol/L (ref 3.5–5.1)
Sodium: 139 mmol/L (ref 135–145)

## 2019-11-02 LAB — GLUCOSE, CAPILLARY
Glucose-Capillary: 107 mg/dL — ABNORMAL HIGH (ref 70–99)
Glucose-Capillary: 96 mg/dL (ref 70–99)

## 2019-11-02 LAB — MAGNESIUM: Magnesium: 1.9 mg/dL (ref 1.7–2.4)

## 2019-11-02 MED ORDER — SODIUM CHLORIDE 0.9% FLUSH
10.0000 mL | Freq: Two times a day (BID) | INTRAVENOUS | Status: DC
  Administered 2019-11-02 – 2019-11-09 (×13): 10 mL
  Administered 2019-11-09 – 2019-11-11 (×4): 20 mL
  Administered 2019-11-11: 22:00:00 40 mL
  Administered 2019-11-12 – 2019-11-13 (×2): 10 mL
  Administered 2019-11-13 – 2019-11-14 (×2): 20 mL
  Administered 2019-11-15 (×2): 10 mL
  Administered 2019-11-16 (×2): 20 mL
  Administered 2019-11-17 – 2019-11-19 (×5): 10 mL

## 2019-11-02 MED ORDER — AMIODARONE HCL IN DEXTROSE 360-4.14 MG/200ML-% IV SOLN
30.0000 mg/h | INTRAVENOUS | Status: DC
  Administered 2019-11-02 – 2019-11-07 (×11): 30 mg/h via INTRAVENOUS
  Filled 2019-11-02 (×11): qty 200

## 2019-11-02 MED ORDER — AMIODARONE LOAD VIA INFUSION
150.0000 mg | Freq: Once | INTRAVENOUS | Status: AC
  Administered 2019-11-02: 03:00:00 150 mg via INTRAVENOUS
  Filled 2019-11-02: qty 83.34

## 2019-11-02 MED ORDER — AMIODARONE HCL IN DEXTROSE 360-4.14 MG/200ML-% IV SOLN
INTRAVENOUS | Status: AC
  Filled 2019-11-02: qty 200

## 2019-11-02 MED ORDER — AMIODARONE HCL IN DEXTROSE 360-4.14 MG/200ML-% IV SOLN
60.0000 mg/h | INTRAVENOUS | Status: AC
  Administered 2019-11-02: 60 mg/h via INTRAVENOUS

## 2019-11-02 MED ORDER — SODIUM CHLORIDE 0.9% FLUSH
10.0000 mL | INTRAVENOUS | Status: DC | PRN
  Administered 2019-11-09: 10 mL

## 2019-11-02 NOTE — Progress Notes (Signed)
Patient refuses to be placed on ATC at this time. Patient states "I cannot breathe". Patient does not appear to be in any distress at this time. Will make CCM aware.

## 2019-11-02 NOTE — Progress Notes (Signed)
Peripherally Inserted Central Catheter Placement  The IV Nurse has discussed with the patient and/or persons authorized to consent for the patient, the purpose of this procedure and the potential benefits and risks involved with this procedure.  The benefits include less needle sticks, lab draws from the catheter, and the patient may be discharged home with the catheter. Risks include, but not limited to, infection, bleeding, blood clot (thrombus formation), and puncture of an artery; nerve damage and irregular heartbeat and possibility to perform a PICC exchange if needed/ordered by physician.  Alternatives to this procedure were also discussed.  Bard Power PICC patient education guide, fact sheet on infection prevention and patient information card has been provided to patient /or left at bedside.    PICC Placement Documentation  PICC Single Lumen 11/02/19 PICC Left Basilic 52 cm 0 cm (Active)  Indication for Insertion or Continuance of Line Prolonged intravenous therapies 11/02/19 1329  Exposed Catheter (cm) 0 cm 11/02/19 1329  Site Assessment Clean;Dry;Intact 11/02/19 1329  Line Status Flushed;Saline locked;Blood return noted 11/02/19 1329  Dressing Type Transparent 11/02/19 1329  Dressing Status Clean;Dry;Antimicrobial disc in place;Intact 11/02/19 1329  Dressing Change Due 11/09/19 11/02/19 1329       Che, Rachal 11/02/2019, 1:31 PM

## 2019-11-02 NOTE — Progress Notes (Signed)
  Subjective:   Brady [name] was examined and evaluated at bedside this am. He was observed to be having difficulty with communication but is able to express that he attempted vent wean yesterday but 'became tired' after couple hours and mentions that repeated trials of weaning from the vent tires him out. Discussed plan for continuing to look for LTAC.  Objective:   Vital Signs (last 24 hours): Vitals:   11/02/19 0400 11/02/19 0500 11/02/19 0600 11/02/19 0700  BP: (!) 93/55 (!) 88/54 107/63 (!) 83/59  Pulse: (!) 103 94 96 (!) 101  Resp: (!) 28 17 (!) 24 17  Temp: 97.7 F (36.5 C)     TempSrc: Oral     SpO2: 97% 98% 90% 92%  Weight:  133.4 kg    Height:       Physical Exam: General Alert and responds to questions, no acute distress  Cardiac Regular rate and rhythm, no murmurs, rubs, or gallops  Pulmonary Trach in place, no distress, bilateral air movement  Abdominal Soft, non-tender, without distention  Extremities No peripheral edema   CBC Latest Ref Rng & Units 11/02/2019 11/01/2019 10/31/2019  WBC 4.0 - 10.5 K/uL 12.4(H) 15.2(H) 16.4(H)  Hemoglobin 13.0 - 17.0 g/dL 12.5(L) 11.1(L) 11.0(L)  Hematocrit 39.0 - 52.0 % 39.0 33.9(L) 34.8(L)  Platelets 150 - 400 K/uL 280 279 270   BMP Latest Ref Rng & Units 11/02/2019 11/01/2019 10/31/2019  Glucose 70 - 99 mg/dL 99 87 90  BUN 8 - 23 mg/dL 13 14 18   Creatinine 0.61 - 1.24 mg/dL ) <7.02(O) <3.78(H)  Sodium 135 - 145 mmol/L 139 133(L) 138  Potassium 3.5 - 5.1 mmol/L 4.6 4.1 4.2  Chloride 98 - 111 mmol/L 97(L) 97(L) 102  CO2 22 - 32 mmol/L 32 29 29  Calcium 8.9 - 10.3 mg/dL 9.0 8.85(O) 2.7(X)    Assessment/Plan:   Active Problems:   Hypotension   Lactic acidosis   Small bowel obstruction (HCC)   VAP (ventilator-associated pneumonia) (HCC)   Acute on chronic respiratory failure (HCC)   S/P bronchoscopy   Pressure injury of sacral region, stage 50  64 year old [name] with past medical history of vent dependent respiratory failure  secondary to pneumonia in February 2021, hyperlipidemia, atypical depression, COPD, chronic lower back pain, PEG tube placement on 34/11/2019 presented from [facility] with SBO and recurrence of Serratia pneumonia.  # SBO - resolved # Hypokalemic Hypomagnesemia Patient with overflow diarrhea after aggressive bowel regimen following resolution of SBO, this continues to improve *PRNs available for constipation with docusate, miralax, and senokot *Abdominal symptoms improved after switch to more concentrated tube feed at lower rate *Continue TF per dietician, currently at goal *Repelete K and Mag as needed  # Hypotension: Unclear etiology, pressures low but stable *Continue midodrine 5 mg TID  # Serratia Pneumonia # Vent dependent respiratory failure 10-day course of broad-spectrum antibiotics completed on 3/28. *Midline removed, IV meds via PIV *Patient not tolerating weaning of vent *Chest physiotherapy *SLP evaluation as able *Attempt wean to trach collar twice daily as tolerated  # New Onset A. Fib - resolved Patient in sinus rhythm, chads vas score 1. Cardiology consulted, recommended low dose beta-blocker with no additional workup. Patient unable to tolerate BB due to hyptension  PT/OT: Consulted, recommend SNF/LTAC Diet: NPO, feeds per G-tube DVT Ppx: Lovenox 60 mg daily Admit Status: Inpatient Dispo: Anticipated discharge pending placement  4/28, MD 11/02/2019, 7:31 AM

## 2019-11-02 NOTE — Progress Notes (Signed)
eLink Physician-Brief Progress Note Patient Name: Brady [name] DOB: [birth year] MRN: [id number]   Date of Service  11/02/2019  HPI/Events of Note  12 Lead EKG reveals AFIB with RVR and PVC's. BP = 99/65. Patient on Midodrine.   eICU Interventions  Will order: 1. Amiodarone IV load and infusion.      Intervention Category Major Interventions: Arrhythmia - evaluation and management  Alexavier Tsutsui Eugene 11/02/2019, 2:54 AM

## 2019-11-02 NOTE — TOC Progression Note (Signed)
Transition of Care [facility]) - Progression Note    Patient Details  Name: Wenzel Backlund MRN: [id number] Date of Birth: 06/02/1956  Transition of Care [facility]) CM/SW Contact  Janae Bridgeman, RN Phone Number: 11/02/2019, 8:32 AM  Clinical Narrative:     [place]e [place], Liaison [phone]) with [facility] returned a call after additional clinicals were sent for the patient.  Patient will need to have long-term medicaid in process along with rectal CRE swab and recent COVID within 24-48 hours of transfer to the facility.  Budd Palmer was updated to assist with progression for discharge needs.  Will follow up with CSW.      Expected Discharge Plan and Services                                                 Social Determinants of Health (SDOH) Interventions    Readmission Risk Interventions No flowsheet data found.

## 2019-11-02 NOTE — Progress Notes (Signed)
Patient HR in the 150s in an atrial fibrillation like morphology. [facility] RN notified and MD notified. Patient alert, oriented, and has good pulses. Rate returned to the 110s with no intervention. Will continue to monitor.

## 2019-11-02 NOTE — Progress Notes (Signed)
eLink Physician-Brief Progress Note Patient Name: Brady [name] DOB: 1955/09/13 MRN: [id number]   Date of Service  11/02/2019  HPI/Events of Note  Elevated HR - HR = 141. Looks like AFIB on monitor, however, having many PVC's which make interpretation more difficult.   eICU Interventions  Will order: 1. 12 Lead EKG STAT. 2. Please send AM BMP and Mg++ level now.      Intervention Category Major Interventions: Arrhythmia - evaluation and management  Sommer,Steven Eugene 11/02/2019, 2:37 AM

## 2019-11-02 NOTE — Progress Notes (Signed)
Patient was transported to 4N26 without any complications. Report was given to 4N RT.

## 2019-11-02 NOTE — Progress Notes (Signed)
Responded to PIV consult. Midline assessed and leaking at site. Pt states midline is about 47 month old. Pt requests to consider placing PICC and states he had 2 in the past due to poor vascular access. Discontinued midline and obtained PIV to continue current IV medication. Discussed pt's request with RN. RN confirms will be addressed during MD rounding.

## 2019-11-02 DEATH — deceased

## 2019-11-03 DIAGNOSIS — J9621 Acute and chronic respiratory failure with hypoxia: Secondary | ICD-10-CM

## 2019-11-03 DIAGNOSIS — Z930 Tracheostomy status: Secondary | ICD-10-CM

## 2019-11-03 LAB — CBC
HCT: 33.2 % — ABNORMAL LOW (ref 39.0–52.0)
Hemoglobin: 10.7 g/dL — ABNORMAL LOW (ref 13.0–17.0)
MCH: 31.1 pg (ref 26.0–34.0)
MCHC: 32.2 g/dL (ref 30.0–36.0)
MCV: 96.5 fL (ref 80.0–100.0)
Platelets: 309 10*3/uL (ref 150–400)
RBC: 3.44 MIL/uL — ABNORMAL LOW (ref 4.22–5.81)
RDW: 14.5 % (ref 11.5–15.5)
WBC: 13.7 10*3/uL — ABNORMAL HIGH (ref 4.0–10.5)
nRBC: 0 % (ref 0.0–0.2)

## 2019-11-03 LAB — BASIC METABOLIC PANEL
Anion gap: 11 (ref 5–15)
BUN: 15 mg/dL (ref 8–23)
CO2: 30 mmol/L (ref 22–32)
Calcium: 8.7 mg/dL — ABNORMAL LOW (ref 8.9–10.3)
Chloride: 94 mmol/L — ABNORMAL LOW (ref 98–111)
Creatinine, Ser: <0.3 mg/dL — ABNORMAL LOW (ref 0.61–1.24)
Glucose, Bld: 93 mg/dL (ref 70–99)
Potassium: 3.5 mmol/L (ref 3.5–5.1)
Sodium: 135 mmol/L (ref 135–145)

## 2019-11-03 LAB — GLUCOSE, CAPILLARY
Glucose-Capillary: 91 mg/dL (ref 70–99)
Glucose-Capillary: 86 mg/dL (ref 70–99)
Glucose-Capillary: 89 mg/dL (ref 70–99)

## 2019-11-03 LAB — MAGNESIUM: Magnesium: 1.5 mg/dL — ABNORMAL LOW (ref 1.7–2.4)

## 2019-11-03 MED ORDER — MAGNESIUM SULFATE 4 GM/100ML IV SOLN
4.0000 g | Freq: Once | INTRAVENOUS | Status: AC
  Administered 2019-11-03: 10:00:00 4 g via INTRAVENOUS
  Filled 2019-11-03: qty 100

## 2019-11-03 MED ORDER — POTASSIUM CHLORIDE 20 MEQ/15ML (10%) PO SOLN
40.0000 meq | Freq: Once | ORAL | Status: AC
  Administered 2019-11-03: 40 meq
  Filled 2019-11-03: qty 30

## 2019-11-03 MED ORDER — ADULT MULTIVITAMIN LIQUID CH
15.0000 mL | Freq: Every day | ORAL | Status: DC
  Administered 2019-11-03 – 2019-11-18 (×16): 15 mL
  Filled 2019-11-03 (×16): qty 15

## 2019-11-03 MED ORDER — POTASSIUM CHLORIDE CRYS ER 20 MEQ PO TBCR: 40.0000 meq | EXTENDED_RELEASE_TABLET | Freq: Once | ORAL | Status: DC

## 2019-11-03 MED ORDER — FREE WATER
200.0000 mL | Status: DC
  Administered 2019-11-03 – 2019-11-13 (×60): 200 mL

## 2019-11-03 MED ORDER — SODIUM CHLORIDE 3 % IN NEBU
4.0000 mL | INHALATION_SOLUTION | Freq: Two times a day (BID) | RESPIRATORY_TRACT | Status: AC
  Administered 2019-11-03 – 2019-11-05 (×6): 4 mL via RESPIRATORY_TRACT
  Filled 2019-11-03 (×6): qty 4

## 2019-11-03 NOTE — Progress Notes (Signed)
PT Cancellation Note  Patient Details Name: Ryoma Nofziger MRN: [id number] DOB: Jul 09, 1956   Cancelled Treatment:    Reason Eval/Treat Not Completed: Fatigue/lethargy limiting ability to participate;Patient declined, no reason specified.  PT woke pt from sleeping, was weaning for a short time earlier this AM and RN reported he was very anxious, resting comfortably on full vent support now.  He said "maybe tomorrow" mouthing words to PT.  PT informed him that we would not be back until Monday and he mouthed, "Monday then" closed his eyes to return to sleeping.  PT will check back Monday.   Thanks,  Corinna Capra, PT, DPT  Acute Rehabilitation [phone] pager #[phone] office       Lurena Joiner B Lillion Elbert 11/03/2019, 11:45 AM

## 2019-11-03 NOTE — Progress Notes (Signed)
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  This improved with IV fluids.  Imaging right lower lobe consolidation.  Admit to progressive care with PCCM consult for vent management  Past Medical History  Spinal stenosis, hepatitis C, hypertension, chronic respiratory failure trach and vent dependent  Significant Hospital Events   3/19-admit to progressive care 4/1 A. fib RVR, transferred to 4 N.  Consults:  PCCM General surgery  Procedures:   Bronchoscopy 3/20 >> therapeutic aspiration of thick, purulent secretions and 28mm x 45mm chipped tooth suctioned from RLL  Significant Diagnostic Tests:  3/19 CT abdomen pelvis>>Mildly dilated fluid-filled loops of proximal and mid small bowel with transition to decompressed distal and terminal ileum suggestive of low-grade bowel obstruction 3/19 CT chest>>Complete collapse of the RLL secondary to aspirated debris within the RLL bronchus. Consolidation in the posterior segments of the RUL concerning for aspiration.Cholelithiasis with questionable gallbladder wall thickening. 3/19-3/20: significant stool burden on CT and KUB (personal interpretation) 3/23> Echo: LVEF 55-60% without RWMA and with trivial mitral valve regurgitation  Micro Data:  3/19 SARS-Covid-2>> negative 3/19 urine culture>> E. Faecalis  3/19 blood cultures x2>> negative x5 days 3/20 respiratory culture >> Serratia marcescens, R-cefazolin 3/29 blood cultures x2 >>ng  Antimicrobials:  Cefepime 3/19>>3/26 Flagyl 3/19 Vancomycin 3/19>3/21 Bactrim 3/26>>3/28  Interim history/subjective:    Transferred to ICU due to A. fib RVR Complains of "difficult to breathe" Afebrile, no chest pain  Objective   Blood pressure 122/66, pulse 88, temperature 99.7 F (37.6 C), temperature source Oral, resp. rate (!) 30, height 5\' 8"  (1.727 m), weight 133.7 kg, SpO2 95 %.    Vent Mode: PSV;CPAP FiO2 (%):  [40 %] 40 % Set Rate:  [12 bmp] 12 bmp Vt Set:  [500 mL] 500 mL PEEP:  [5 cmH20] 5 cmH20 Pressure Support:  [10 cmH20] 10 cmH20 Plateau Pressure:  [15 cmH20-28 cmH20] 15 cmH20   Intake/Output Summary (Last 24 hours) at 11/03/2019 0853 Last data filed at 11/03/2019 0800 Gross per 24 hour  Intake 2299.95 ml  Output 1450 ml  Net 849.95 ml   Filed Weights   11/01/19 0100 11/02/19 0500 11/03/19 0500  Weight: 134 kg 133.4 kg 133.7 kg   General: Chronically ill-appearing male in no acute distress, on vent HEENT: MM pink/moist, trach in place. Poor dentition.  Neuro: Awake and interactive, mouths words, weak both lower extremities 2/5 CV: s1s2 present, RRR, no m/r/g PULM: Decreased breath sounds bilateral, faint scattered rhonchi GI: Mild distended, nontender, soft, bsx4 active.  Extremities: no edema appreciated; warm, dry Skin: chronic venous stasis changes of bilateral lower extremities  Chest x-ray 3/28 personally reviewed shows elevated right hemidiaphragm/right lower lobe atelectasis, patchy infiltrate on left  Resolved Hospital Problem list   SBO Assessment & Plan:  Right lower lobe collapse, chronic trach and vent dependent respiratory failure Serratia PNA Likely multifactorial in setting of chronic ventilation, mucous plugging and aspiration event-tooth suctioned out on bronchoscopy 3/20, history of Serratia and Pseudomonas pneumonia.He has been treated with 10 days of broad spectrum antibiotics for serratia pneumonia. Was tolerating trach collar trials until 3/29 - Chest physiotherapy with vest and add hypertonic saline nebs -Continue spontaneous breathing  trials, he feels  he is not getting enough air on pressure support 10/5 so increased to 15/5 -Repeat chest x-ray, if right lower lobe remains collapsed and may need repeat bronchoscopy  Ileus secondary to severe constipation with overflow diarrhea  Patient admitted for SBO, treated conservatively - Continuing with bowel regimen prn -Tolerating tube feeds -Hypokalemia and hypomagnesemia being repleted   Atrial fibrillation/RVR -on IV amiodarone, has reverted to sinus rhythm Metoprolol limited by soft blood pressure  Chronic hypotension Unclear etiology; however may be secondary to ongoing GI losses.  - Continue midodrine 5mg  tid  On Seroquel and BuSpar for anxiety  PCCM will see again Monday  Tuesday MD. [facility]  If no response to pager , please call [phone]    11/03/2019, 8:53 AM

## 2019-11-03 NOTE — Progress Notes (Signed)
  Subjective:  Patient seen at bedside. Patient reports some mild pain in his left hip, no other complaints. Patient counseled on plan of care.  Objective:    Vital Signs (last 24 hours): Vitals:   11/03/19 0323 11/03/19 0400 11/03/19 0500 11/03/19 0600  BP: 116/66 109/60 110/67 112/70  Pulse: 88 83 82 76  Resp: 17 17 15 17   Temp:  98.7 F (37.1 C)    TempSrc:  Oral    SpO2: 96% 97% 96% 96%  Weight:   133.7 kg   Height:        Physical Exam: General Alert and answers questions appropriately, no acute distress  Cardiac Regular rate and rhythm, no murmurs, rubs, or gallops  Pulmonary Trach in place, no distress, bilateral air movement  Abdominal Soft, non-tender, without distension  Extremities No peripheral edema   CBC Latest Ref Rng & Units 11/03/2019 11/02/2019 11/01/2019  WBC 4.0 - 10.5 K/uL 13.7(H) 12.4(H) 15.2(H)  Hemoglobin 13.0 - 17.0 g/dL 10.7(L) 12.5(L) 11.1(L)  Hematocrit 39.0 - 52.0 % 33.2(L) 39.0 33.9(L)  Platelets 150 - 400 K/uL 309 280 279   BMP Latest Ref Rng & Units 11/03/2019 11/02/2019 11/01/2019  Glucose 70 - 99 mg/dL 93 99 87  BUN 8 - 23 mg/dL 15 13 14   Creatinine 0.61 - 1.24 mg/dL 11/03/2019) ) <7.25(D)  Sodium 135 - 145 mmol/L 135 139 133(L)  Potassium 3.5 - 5.1 mmol/L 3.5 4.6 4.1  Chloride 98 - 111 mmol/L 94(L) 97(L) 97(L)  CO2 22 - 32 mmol/L 30 32 29  Calcium 8.9 - 10.3 mg/dL <6.64(Q) 9.0 <0.34(V)   Mag: 1.5  Assessment/Plan:   Active Problems:   Hypotension   Lactic acidosis   Small bowel obstruction (HCC)   VAP (ventilator-associated pneumonia) (HCC)   Acute on chronic respiratory failure (HCC)   S/P bronchoscopy   Pressure injury of sacral region, stage 1  Patient is a 64 year old male with past medical history of vent dependent respiratory failure secondary to pneumonia in February 2021, hyperlipidemia, atypical depression, COPD, chronic lower back pain, PEG tube placement on 10/05/19 presenting from [facility] with SBO and recurrence of serratia  pneumonia.  # SBO - resolved # Hypokalemic hypomagnesemia Patient with overflow diarrhea after aggressive bowel regimen following resolution of SBO, this continues to improve. *As needed's available for constipation with docusate, MiraLAX, and Senokot *Abdominal symptoms improved after switch to more concentrated tube feed at lower rate *Continue tube feeds per dietitian, currently at goal with rate of 40 mL/h *Replete potassium and magnesium as needed  # Hypotension Exact etiology unclear, but pressures appear stable though soft *Continue midodrine 5 mg TID  # Serratia Pneumonia # Vent dependent respiratory failure 10-day course of broad-spectrum antibiotics completed on 3/28. *Midline removed, IV meds via PIV *Patient not tolerating weaning of vent *Chest physiotherapy *SLP evaluation as able *Attempt wean to trach collar twice daily as tolerated  # New Onset A. Fib - resolved Patient in sinus rhythm, chads vas score 1. Cardiology consulted, recommended low dose beta-blocker with no additional workup. Patient unable to tolerate BB due to hyptension  PT/OT: Recommend SNF/LTAC, TOC working on placement. Will need CRE rectal swab and COVID 24-48 hours prior to departure Diet: NPO, feeds per G-tube DVT Ppx: Lovenox 60 mg daily Admit Status: Inpatient Dispo: Medical cleared for discharge, pending placement  12/05/19, MD 11/03/2019, 7:03 AM

## 2019-11-03 NOTE — Hospital Course (Addendum)
Patient is a 64 year old male with past medical history significant for ventilatory dependent respiratory failure secondary pneumonia in February 2021, hyperlipidemia, atypical depression, COPD, chronic lower back pain, PEG tube placement on 10/05/2019 who presented from [facility] on 10/16/2019 with small bowel obstruction and recurrence of Serratia pneumonia.  # Small bowel obstruction: On presentation, CT abdomen/pelvis showed findings of low-grade bowel obstruction. NG tube was placed, patient was made NPO and provided supportive care. General surgery consulted and recommended continued medical management. Patient was placed on aggressive bowel regimen with miralax, dulcolax, and colace. NG tube was removed on 10/22/19. Patient had improvement, tolerated feeds and had bowel movements. Electrolytes were repleted as needed  # Serratia Pneumonia: # Ventilatory Dependent Respiratory Failure: Patient underwent bronchoscopy and lavage on 10/25/2019 and respiratory culture revealed rare Serratia marcescens.  Patient was treated with 10-day course of broad-spectrum antibiotics with 7 days of cefepime, 3 days of Bactrim. Patient underwent and failed multiple weaning trials of the ventilator. Patient underwent repeat bronchoscopy on 11/04/2019 demonstrated copious mucopurulent secretions with 150 cc removed. Sputum analysis demonstrated few serratia marcescens and patient was restarted on Bactrim on 4/8 and received a 4 day course. CXR on 11/10/19 demonstrated worsening of right-sided atelectasis/consolidation and repeat bronchoscopy performed on 11/10/19 with removal of thick purulent mucus in the right mainstem bronchus down into lower lobes. With continued thick secretions, bronchoscopy performed again on 11/11/2019 with thick mucopurulent secretions suctioned from the RLL after lavage. On 11/18/19, he developed acutely worsening hypoxic respiratory failure with evidence of mucus plug with right lung collapse. He expressed  desire to transition to comfort care after prolonged ventilation support. Family was called to bedside and one-way extubation to comfort was done.  # Hypotension: On evening 3/25-3/26, patient was noted to be hypotensive. Metoprolol was discontinued and patient given fluids and vasopressor (phenylephrine) support to maintain MAP>65. Patient was started on midodrine (2.5 mg twice daily) and free water tube-feed flushes (200 mL Q4HRs). Phenylephrine was discontinued on 3/27 and midodrine increased to 5 mg twice daily on 3/28.  # Atrial Fibrillation Patient with new diagnosis of atrial fibrillation on presentation. CHA2DS2-VASc2 score of 1. TTE on 10/23/19 was without any regional wall motion abnormality or apparent valvular abnormalities. Patient was initially placed on metoprolol 12.5 mg twice daily which was discontinued due to low blood pressure.   # Hypovolemic Hyponatremia: Sodium of 125 on admission. Symptoms and urine studies consistent with hypovolemic hyponatremia. Improved with normal saline

## 2019-11-03 NOTE — Progress Notes (Signed)
Nutrition Follow-up  DOCUMENTATION CODES:   Morbid obesity  INTERVENTION:   Tube Feeding via PEG: Vital 1.5 at 40 ml/hr Pro-Stat 30 mL 5 times daily Provides 1940 kcals, 140 g of protein and 730 mL of free water  Free water to 200 mL q 4 hours Total free water from TF and flushes: 1930 mL   MVI with Minerals  NUTRITION DIAGNOSIS:   Inadequate oral intake related to inability to eat as evidenced by NPO status.  Being addressed via TF   GOAL:   Patient will meet greater than or equal to 90% of their needs  Meeting with TF   MONITOR:   TF tolerance, Diet advancement, Labs, Weight trends, Skin  REASON FOR ASSESSMENT:   Consult Enteral/tube feeding initiation and management  ASSESSMENT:   64 yo male admitted with RLL collapse with aspiration and mucous plugging, ileus secondary to severe constipation. PMH includes spinal stenosis, hepatitis C, HTN, chronic respiratory failure with trach on vent support with PEG tube since 09/2019  3/19 Admitted, CT abdomen suggestive of low grade bowel obstruction 3/20 Bronch: chipped tooth (3 mm x 5 mm) removed from RLL, thick, purulent secretions removed from RML  Pt discussed during ICU rounds and with RN.  Per RN pt is tolerating TF and weaning on the vent.   Labs: reviewed magnesium 1.5 (L) Meds: reviewed magsulfate x 1    Diet Order:   Diet Order            Diet NPO time specified  Diet effective now              EDUCATION NEEDS:   Not appropriate for education at this time  Skin:  Skin Assessment: Skin Integrity Issues: Skin Integrity Issues:: Stage I Stage I: sacrum Other: MASD to bilateral buttock  Last BM:  300 ml via rectal tube  Height:   Ht Readings from Last 1 Encounters:  11-16-2019 5\' 8"  (1.727 m)    Weight:   Wt Readings from Last 1 Encounters:  11/03/19 133.7 kg    Ideal Body Weight:  70 kg  BMI:  Body mass index is 44.82 kg/m.  Estimated Nutritional Needs:   Kcal:  1550-1750  kcals  Protein:  140-175  Fluid:  >/= 2 L  Peterson Mathey P., RD, LDN, CNSC See AMiON for contact information

## 2019-11-04 ENCOUNTER — Inpatient Hospital Stay (HOSPITAL_COMMUNITY): Payer: Medicare Other

## 2019-11-04 DIAGNOSIS — J9809 Other diseases of bronchus, not elsewhere classified: Secondary | ICD-10-CM

## 2019-11-04 LAB — CBC
HCT: 34 % — ABNORMAL LOW (ref 39.0–52.0)
Hemoglobin: 11 g/dL — ABNORMAL LOW (ref 13.0–17.0)
MCH: 31.5 pg (ref 26.0–34.0)
MCHC: 32.4 g/dL (ref 30.0–36.0)
MCV: 97.4 fL (ref 80.0–100.0)
Platelets: 312 10*3/uL (ref 150–400)
RBC: 3.49 MIL/uL — ABNORMAL LOW (ref 4.22–5.81)
RDW: 14.8 % (ref 11.5–15.5)
WBC: 12.6 10*3/uL — ABNORMAL HIGH (ref 4.0–10.5)
nRBC: 0 % (ref 0.0–0.2)

## 2019-11-04 LAB — GLUCOSE, CAPILLARY
Glucose-Capillary: 85 mg/dL (ref 70–99)
Glucose-Capillary: 70 mg/dL (ref 70–99)
Glucose-Capillary: 95 mg/dL (ref 70–99)
Glucose-Capillary: 108 mg/dL — ABNORMAL HIGH (ref 70–99)
Glucose-Capillary: 93 mg/dL (ref 70–99)
Glucose-Capillary: 106 mg/dL — ABNORMAL HIGH (ref 70–99)

## 2019-11-04 LAB — BASIC METABOLIC PANEL
Anion gap: 10 (ref 5–15)
BUN: 13 mg/dL (ref 8–23)
CO2: 32 mmol/L (ref 22–32)
Calcium: 8.6 mg/dL — ABNORMAL LOW (ref 8.9–10.3)
Chloride: 94 mmol/L — ABNORMAL LOW (ref 98–111)
Creatinine, Ser: <0.3 mg/dL — ABNORMAL LOW (ref 0.61–1.24)
Glucose, Bld: 100 mg/dL — ABNORMAL HIGH (ref 70–99)
Potassium: 4.1 mmol/L (ref 3.5–5.1)
Sodium: 136 mmol/L (ref 135–145)

## 2019-11-04 LAB — MAGNESIUM: Magnesium: 1.7 mg/dL (ref 1.7–2.4)

## 2019-11-04 MED ORDER — MIDAZOLAM HCL 2 MG/2ML IJ SOLN
INTRAMUSCULAR | Status: AC
  Filled 2019-11-04: qty 2

## 2019-11-04 MED ORDER — FENTANYL CITRATE (PF) 100 MCG/2ML IJ SOLN
INTRAMUSCULAR | Status: AC
  Filled 2019-11-04: qty 2

## 2019-11-04 MED ORDER — FENTANYL CITRATE (PF) 100 MCG/2ML IJ SOLN
100.0000 ug | Freq: Once | INTRAMUSCULAR | Status: AC
  Administered 2019-11-04: 50 ug via INTRAVENOUS

## 2019-11-04 MED ORDER — MAGNESIUM SULFATE 4 GM/100ML IV SOLN
4.0000 g | Freq: Once | INTRAVENOUS | Status: AC
  Administered 2019-11-04: 4 g via INTRAVENOUS
  Filled 2019-11-04: qty 100

## 2019-11-04 MED ORDER — ENOXAPARIN SODIUM 80 MG/0.8ML ~~LOC~~ SOLN
65.0000 mg | SUBCUTANEOUS | Status: DC
  Administered 2019-11-04 – 2019-11-12 (×9): 65 mg via SUBCUTANEOUS
  Filled 2019-11-04 (×9): qty 0.8

## 2019-11-04 MED ORDER — MIDAZOLAM HCL 2 MG/2ML IJ SOLN
2.0000 mg | Freq: Once | INTRAMUSCULAR | Status: AC
  Administered 2019-11-04: 1 mg via INTRAVENOUS

## 2019-11-04 NOTE — Progress Notes (Signed)
  Subjective:  Mr. Brady [name] was examined and evaluated at bedside this am. Discussed current plan of looking for LTAC and following up with pulm regarding chest X-ray findings. Mr. Brady [name] expressed understanding.   Objective:    Vital Signs (last 24 hours): Vitals:   11/03/19 2321 11/04/19 0000 11/04/19 0334 11/04/19 0400  BP:      Pulse:      Resp:      Temp:  98.5 F (36.9 C)  98.4 F (36.9 C)  TempSrc:  Oral  Oral  SpO2: 99%  100%   Weight:      Height:       Physical Exam: General Alert and answers questions appropriately, no acute distress  Cardiac Regular rate and rhythm, no murmurs, rubs, or gallops  Pulmonary Trach in place, no distress, bilateral air movement   CBC Latest Ref Rng & Units 11/04/2019 11/03/2019 11/02/2019  WBC 4.0 - 10.5 K/uL 12.6(H) 13.7(H) 12.4(H)  Hemoglobin 13.0 - 17.0 g/dL 11.0(L) 10.7(L) 12.5(L)  Hematocrit 39.0 - 52.0 % 34.0(L) 33.2(L) 39.0  Platelets 150 - 400 K/uL 312 309 280   BMP Latest Ref Rng & Units 11/04/2019 11/03/2019 11/02/2019  Glucose 70 - 99 mg/dL 735(H) 93 99  BUN 8 - 23 mg/dL 13 15 13   Creatinine 0.61 - 1.24 mg/dL ) <2.99(M) <4.26(S)  Sodium 135 - 145 mmol/L 136 135 139  Potassium 3.5 - 5.1 mmol/L 4.1 3.5 4.6  Chloride 98 - 111 mmol/L 94(L) 94(L) 97(L)  CO2 22 - 32 mmol/L 32 30 32  Calcium 8.9 - 10.3 mg/dL <3.41(D) 6.2(I) 9.0   Magnesium: 1.7  Assessment/Plan:   Active Problems:   Hypotension   Lactic acidosis   Small bowel obstruction (HCC)   VAP (ventilator-associated pneumonia) (HCC)   Acute on chronic respiratory failure (HCC)   S/P bronchoscopy   Pressure injury of sacral region, stage 1   Patient is a 64 year old male with past medical history of ventilatory dependent respiratory failure secondary to pneumonia in February 2021, hyperlipidemia, atypical depression, COPD, chronic lower back pain, PEG tube placement on 10/05/2019 presenting from [facility] with SBO and recurrence of Serratia pneumonia.  # SBO - resolved #  Hypokalemic Hypomagnesemia Patient with resolution of SBO. *As needed MiraLAX, Senokot, and docusate available for constipation *Abdominal symptoms improved after switch to more concentrated tube feed is at a lower rate *Continue tube feeds per dietitian, currently at goal *Replete potassium, magnesium as needed  # Hypotension Exact etiology unclear, but pressures appear stable though soft *Continue midodrine 5 mg TID  # Serratia Pneumonia # Vent dependent respiratory failure 10-day course of broad-spectrum antibiotics completed on 3/28. *Midline removed, IV meds via PIV *Patient not tolerating weaning of vent *Chest physiotherapy *SLP evaluation as able *Attempt wean to trach collar twice daily as tolerated *Will followup with PCCM on need for repeat bronchoscopy  # New Onset A. Fib - resolved Patient in sinus rhythm, CHA2DS2-VASc2 score 1.Cardiology consulted, recommended low dose beta-blocker with no additional workup. Patient unable to tolerate BB due to hyptension  PT/OT: Recommend SNF/LTAC, TOC working on placement, will need to CRE rectal swab and COVID-19 test 24-48 hours prior to departure Diet: NPO, feeds per G-tube DVT Ppx: Lovenox 60 mg daily Admit Status: Inpatient Dispo: Medical cleared for discharge, pending placement  4/28, MD 11/04/2019, 6:48 AM

## 2019-11-04 NOTE — Progress Notes (Signed)
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  This improved with IV fluids.  Imaging right lower lobe consolidation.  Admit to progressive care with PCCM consult for vent management  Past Medical History  Spinal stenosis, hepatitis C, hypertension, chronic respiratory failure trach and vent dependent  Significant Hospital Events   3/19-admit to progressive care 4/1 A. fib RVR, transferred to 4 N.  Consults:  PCCM General surgery  Procedures:   Bronchoscopy 3/20 >> therapeutic aspiration of thick, purulent secretions and 72mm x 34mm chipped tooth suctioned from RLL  Significant Diagnostic Tests:  3/19 CT abdomen pelvis>>Mildly dilated fluid-filled loops of proximal and mid small bowel with transition to decompressed distal and terminal ileum suggestive of low-grade bowel obstruction 3/19 CT chest>>Complete collapse of the RLL secondary to aspirated debris within the RLL bronchus. Consolidation in the posterior segments of the RUL concerning for aspiration.Cholelithiasis with questionable gallbladder wall thickening. 3/19-3/20: significant stool burden on CT and KUB (personal interpretation) 3/23> Echo: LVEF 55-60% without RWMA and with trivial mitral valve regurgitation  Micro Data:  3/19 SARS-Covid-2>> negative 3/19 urine culture>> E. Faecalis  3/19 blood cultures x2>> negative x5 days 3/20 respiratory culture >> Serratia marcescens, R-cefazolin 3/29 blood cultures x2 >>ng  Antimicrobials:  Cefepime 3/19>>3/26 Flagyl 3/19 Vancomycin 3/19>3/21 Bactrim 3/26>>3/28  Interim history/subjective:   No  overnight issues. Afebrile.  Objective   Blood pressure 118/76, pulse 82, temperature 98.3 F (36.8 C), temperature source Oral, resp. rate (!) 22, height 5\' 8"  (1.727 m), weight 133.7 kg, SpO2 (!) 84 %.    Vent Mode: PRVC FiO2 (%):  [40 %] 40 % Set Rate:  [12 bmp] 12 bmp Vt Set:  [500 mL] 500 mL PEEP:  [5 cmH20] 5 cmH20 Plateau Pressure:  [16 cmH20-23 cmH20] 23 cmH20   Intake/Output Summary (Last 24 hours) at 11/04/2019 1036 Last data filed at 11/04/2019 0900 Gross per 24 hour  Intake 3304.38 ml  Output 1550 ml  Net 1754.38 ml   Filed Weights   11/01/19 0100 11/02/19 0500 11/03/19 0500  Weight: 134 kg 133.4 kg 133.7 kg   General: Chronically ill-appearing male in no acute distress, on vent HEENT: MM pink/moist, trach in place. Poor dentition.  Neuro: Awake and interactive, mouths words, weak both lower extremities 2/5 CV: s1s2 present, RRR, no m/r/g PULM: Decreased breath sounds bilateral, faint scattered rhonchi GI: Mild distended, nontender, soft, bsx4 active.  Extremities: no edema appreciated; warm, dry Skin: chronic venous stasis changes of bilateral lower extremities  Chest x-ray 4/3 personally reviewed with large right mainstem mucus plug and right lung atelectasis  Resolved Hospital Problem list   SBO Assessment & Plan:  Right lower lobe collapse, chronic trach and vent dependent respiratory failure Serratia PNA Likely multifactorial in setting of chronic ventilation, mucous plugging and aspiration event-tooth suctioned out on bronchoscopy 3/20, history of Serratia and Pseudomonas pneumonia.He has been treated with 10 days of broad spectrum antibiotics for serratia pneumonia. Was tolerating trach collar trials until 3/29 - Chest physiotherapy with vest and add hypertonic saline nebs -Continue spontaneous breathing trials, he feels he is not getting enough air on pressure support 10/5 so increased to 15/5 -Repeat chest x-ray  shows persistent atelectasis of the right  mainstem bronchus secondary to mucus plug - will plan for bronchoscopy this afternoon  Ileus secondary to severe constipation with overflow diarrhea  Patient admitted for SBO, treated conservatively - Continuing with bowel regimen prn -Tolerating tube feeds -Hypokalemia and hypomagnesemia being repleted   Atrial fibrillation/RVR  -on IV amiodarone, has reverted to sinus rhythm   Chronic hypotension Unclear etiology; however may be secondary to ongoing GI losses.  - Continue midodrine 5mg  tid   The patient is critically ill with multiple organ systems failure and requires high complexity decision making for assessment and support, frequent evaluation and titration of therapies, application of advanced monitoring technologies and extensive interpretation of multiple databases.   Critical Care Time devoted to patient care services described in this note is 31 minutes. This time reflects time of care of this [place] . This critical care time does not reflect separately billable procedures or procedure time, teaching time or supervisory time of PA/NP/Med student/Med Resident etc but could involve care discussion time.  [facility] 11/04/2019 10:36 AM  Pager: [phone] After hours pager: [phone]

## 2019-11-04 NOTE — Procedures (Signed)
Bronchoscopy Procedure Note Brady [name] [id number] [birth year]  Procedure: Bronchoscopy Indications: Remove secretions  Procedure Details Consent: Risks of procedure as well as the alternatives and risks of each were explained to the (patient/caregiver).  Consent for procedure obtained. Time Out: Verified patient identification, verified procedure, site/side was marked, verified correct patient position, special equipment/implants available, medications/allergies/relevent history reviewed, required imaging and test results available.  Performed  In preparation for procedure, patient was given 100% FiO2 and bronchoscope lubricated. Sedation: 1mg  versed 50 mcg fentanyl  Airway entered and the following bronchi were examined: The entire tracheoronchial tree was examined without endobronchial lesions. There were copious mucupurulent secretions of the tracheobronchial tree - greater on the right than on the left.  These were suctioned.  Over 150 ml of mucopurulent secretions removed.  Procedures performed: airway examination. Therapeutic aspiration of secretions Bronchoscope removed.    Evaluation Hemodynamic Status: BP stable throughout; O2 sats: stable throughout Patient's Current Condition: stable Specimens:  None Complications: No apparent complications Patient did tolerate procedure well.   11/04/2019

## 2019-11-05 LAB — BASIC METABOLIC PANEL WITH GFR
Anion gap: 10 (ref 5–15)
BUN: 15 mg/dL (ref 8–23)
CO2: 32 mmol/L (ref 22–32)
Calcium: 8.8 mg/dL — ABNORMAL LOW (ref 8.9–10.3)
Chloride: 96 mmol/L — ABNORMAL LOW (ref 98–111)
Creatinine, Ser: <0.3 mg/dL — ABNORMAL LOW (ref 0.61–1.24)
Glucose, Bld: 88 mg/dL (ref 70–99)
Potassium: 4.5 mmol/L (ref 3.5–5.1)
Sodium: 138 mmol/L (ref 135–145)

## 2019-11-05 LAB — CBC
HCT: 37.3 % — ABNORMAL LOW (ref 39.0–52.0)
Hemoglobin: 12.1 g/dL — ABNORMAL LOW (ref 13.0–17.0)
MCH: 31.5 pg (ref 26.0–34.0)
MCHC: 32.4 g/dL (ref 30.0–36.0)
MCV: 97.1 fL (ref 80.0–100.0)
Platelets: 322 K/uL (ref 150–400)
RBC: 3.84 MIL/uL — ABNORMAL LOW (ref 4.22–5.81)
RDW: 15.1 % (ref 11.5–15.5)
WBC: 15.5 K/uL — ABNORMAL HIGH (ref 4.0–10.5)
nRBC: 0 % (ref 0.0–0.2)

## 2019-11-05 LAB — GLUCOSE, CAPILLARY
Glucose-Capillary: 83 mg/dL (ref 70–99)
Glucose-Capillary: 92 mg/dL (ref 70–99)
Glucose-Capillary: 113 mg/dL — ABNORMAL HIGH (ref 70–99)
Glucose-Capillary: 93 mg/dL (ref 70–99)

## 2019-11-05 LAB — MAGNESIUM: Magnesium: 1.8 mg/dL (ref 1.7–2.4)

## 2019-11-05 NOTE — Progress Notes (Addendum)
   Subjective:  Brady [name] is a 64 y.o. with PMH of ventilatory dependent respiratory failure, hyperlipidemia, atypical depression, COPD, chronic lower back pain admit for sbo and pneumonia on hospital day 89  [name] was examined and evaluated at bedside this am. He was observed resting comfortably in bed. He was able to express that he tolerated the bronchoscopy well but indicates that he is continuing to produce sputum. Denies any fevers, chills, chest pain, palpitations.  Objective:  Vital signs in last 24 hours: Vitals:   11/05/19 1300 11/05/19 1400 11/05/19 1500 11/05/19 1546  BP: (!) 149/66 (!) 158/71 131/63 131/63  Pulse: 88 84 87 80  Resp: (!) 22 (!) 27 (!) 21 19  Temp:      TempSrc:      SpO2: 97% 95% 93% 98%  Weight:      Height:       Gen: Chronically ill-appearing, NAD Neck: Trach in place and functioning CV: RRR, S1, S2 normal, No rubs, no murmurs, no gallops Pulm: CTAB, No rales, no wheezes Extm: ROM intact, Peripheral pulses intact, No peripheral edema Skin: Dry, Warm, normal turgor  Assessment/Plan:  Active Problems:   Hypotension   Lactic acidosis   Small bowel obstruction (HCC)   VAP (ventilator-associated pneumonia) (HCC)   Acute on chronic respiratory failure (HCC)   S/P bronchoscopy   Pressure injury of sacral region, stage 1  Brady [name] is a 64 y.o. with PMH of ventilatory dependent respiratory failure, hyperlipidemia, atypical depression, COPD, chronic lower back pain admit for serratia pneumonia.  SBO Presented on admission. Treated w/ enema, laxatives. Currently resolved - C/w tube feeds - C/w PRN miralax, senokot  Chronic hypoxic respiratory failure Vent dependent. Awaiting placement for LTACH. Broncoscopy yesterday revealed 150cc of mucus suctioned out.  - Appreciate PCCM assistance w/ vent management - Chest physiotherapy - Wean collar BID - Discharge once LTAC placement found  Paroxysmal A.Fib Currently in sinus. CHAD-VASC2  score of 1. HR 80. - C/w telemetry  DVT prophx: Lovenox Diet: Tube feeds Bowel: Senokot, MIralax Code: Full  Dispo: Anticipated discharge in approximately 2-3 day(s).   Brady Barrio, MD 11/05/2019, 3:57 PM Pager: [phone]

## 2019-11-06 ENCOUNTER — Inpatient Hospital Stay: Payer: Self-pay

## 2019-11-06 ENCOUNTER — Inpatient Hospital Stay (HOSPITAL_COMMUNITY): Payer: Medicare Other

## 2019-11-06 LAB — CULTURE, BLOOD (ROUTINE X 2)
Culture: NO GROWTH
Culture: NO GROWTH
Special Requests: ADEQUATE
Special Requests: ADEQUATE

## 2019-11-06 LAB — GLUCOSE, CAPILLARY
Glucose-Capillary: 113 mg/dL — ABNORMAL HIGH (ref 70–99)
Glucose-Capillary: 88 mg/dL (ref 70–99)
Glucose-Capillary: 93 mg/dL (ref 70–99)
Glucose-Capillary: 96 mg/dL (ref 70–99)

## 2019-11-06 LAB — MAGNESIUM: Magnesium: 1.5 mg/dL — ABNORMAL LOW (ref 1.7–2.4)

## 2019-11-06 MED ORDER — MAGNESIUM SULFATE 4 GM/100ML IV SOLN
4.0000 g | Freq: Once | INTRAVENOUS | Status: AC
  Administered 2019-11-06: 09:00:00 4 g via INTRAVENOUS
  Filled 2019-11-06: qty 100

## 2019-11-06 MED ORDER — CHLORHEXIDINE GLUCONATE CLOTH 2 % EX PADS
6.0000 | MEDICATED_PAD | Freq: Every day | CUTANEOUS | Status: DC
  Administered 2019-11-07 – 2019-11-17 (×8): 6 via TOPICAL

## 2019-11-06 NOTE — Progress Notes (Signed)
SLP Cancellation Note  Patient Details Name: Brady [name] MRN: [id number] DOB: 03/23/1956   Cancelled treatment:       Reason Eval/Treat Not Completed: Medical issues which prohibited therapy. Pt remains on vent. SLP continues to follow for readiness for PMV and swallow evaluation. If he continues to require ventilation, could consider inline PMV.     Mahala Menghini., M.A. CCC-SLP Acute Rehabilitation Services Pager [phone] Office [phone]  11/06/2019, 7:20 AM

## 2019-11-06 NOTE — Progress Notes (Signed)
  Subjective:  Brady [name] was examined and evaluated at bedside. Discussed continuing to await placement. He mentions not remembering his bronchoscopy from Saturday.  Objective:    Vital Signs (last 24 hours): Vitals:   11/06/19 0300 11/06/19 0309 11/06/19 0400 11/06/19 0500  BP: 128/72  127/68 119/71  Pulse: 81  81 91  Resp: 18  19 (!) 24  Temp:      TempSrc:      SpO2: 100% 100% 97% 99%  Weight:      Height:        Physical Exam: General Alert and answers questions appropriately, no acute distress  Cardiac Regular rate and rhythm, no murmurs, rubs, or gallops  Pulmonary Trach in place, no distress, bilateral air movement   Magnesium: 1.5, repleted   Assessment/Plan:   Active Problems:   Hypotension   Lactic acidosis   Small bowel obstruction (HCC)   VAP (ventilator-associated pneumonia) (HCC)   Acute on chronic respiratory failure (HCC)   S/P bronchoscopy   Pressure injury of sacral region, stage 1  Patient is a 64 year old with past medical history of ventilatory dependent respiratory failure, hyperlipidemia, atypical depression, COPD, chronic lower back pain admitted on 10/05/2019 from [facility] with SBO and recurrence of Serratia pneumonia.  # SBO Present on admission. Treated with enema, laxatives. Resolved. *Continue with tube feeds *Continue with PRN miralax, senokot  # Chronic hypoxic respiratory failure Ventilator dependent, awaiting placement for LTAC, bronchoscopy on 11/04/19 revealed 150 cc of mucus which was suctioned out. *Appreciate PCCM assistance with vent management - rechecking CXR today *Chest physiotherapy *Wean collar BID *Discharge once LTAC placement acquired  Paroxysmal A.Fib Currently in sinus. CHAD-VASC2 score of 1 - C/w telemetry  PT/OT: Following, we appreciate their assistance Diet: Tube feeds DVT Ppx: Lovenox 40 mg daily Admit Status: Inpatient Dispo: Anticipated discharge pending placement  Katherine Roan, MD 11/06/2019, 7:17  AM

## 2019-11-06 NOTE — Progress Notes (Signed)
Physical Therapy Treatment Patient Details Name: Brady [name] MRN: [id number] DOB: 1955-09-22 Today's Date: 11/06/2019    History of Present Illness Pt is a 64 yo male s/p ileus/SBO. Pt vented with trach since Jan 2021. PMHx; pt reports 12 spincal surgeries, Spinal stenosis, hepatitis C, hypertension, chronic respiratory failure trach and vent dependent.    PT Comments    Pt willing to participate today and tolerated bed mobility (dep for hygiene from leaking rectal tube) and OOB transfer to recline using maxi sky lift for the first time since January 2021. Pt's vitals remained stable. Pt also participated in [place] there ex in the bed. Acute PT to cont to follow.    Follow Up Recommendations  SNF;LTACH     Equipment Recommendations  Other (comment)    Recommendations for Other Services       Precautions / Restrictions Precautions Precautions: Fall;Other (comment) Precaution Comments: trach, vent Restrictions Weight Bearing Restrictions: No    Mobility  Bed Mobility Overal bed mobility: Needs Assistance Bed Mobility: Rolling Rolling: Max assist;+2 for physical assistance         General bed mobility comments: pt rolled L/R to place maxi sky lift pad, pt with minimal assist but also didn't resist either  Transfers Overall transfer level: Needs assistance   Transfers: (OOB to chair)           General transfer comment: pt transferred OOB for first time since January of 2021. Pt tolerated transfer well. RN present and respiratory came to suction upon sitting up in chair  Ambulation/Gait                 Stairs             Wheelchair Mobility    Modified Rankin (Stroke Patients Only)       Balance       Sitting balance - Comments: deferred EOB as goal was OOB today                                    Cognition Arousal/Alertness: Awake/alert Behavior During Therapy: WFL for tasks assessed/performed Overall Cognitive  Status: Difficult to assess                                 General Comments: pt mouthing words and understandable majority of time. Pt didn't resist/refuse therapy at all today, compliant and okay with getting OOB      Exercises General Exercises - Lower Extremity Ankle Circles/Pumps: AAROM;Both;10 reps;Supine Quad Sets: AAROM;Both;10 reps;Supine Heel Slides: AAROM;5 reps;Supine;Both    General Comments General comments (skin integrity, edema, etc.): VSS, on vent      Pertinent Vitals/Pain Pain Assessment: Faces Faces Pain Scale: Hurts even more Pain Location: L LE with movement, back Pain Descriptors / Indicators: Discomfort Pain Intervention(s): Monitored during session    Home Living                      Prior Function            PT Goals (current goals can now be found in the care plan section) Acute Rehab PT Goals PT Goal Formulation: With patient Time For Goal Achievement: 11/20/19 Potential to Achieve Goals: Fair Progress towards PT goals: Progressing toward goals    Frequency    Min 2X/week      PT  Plan Current plan remains appropriate    Co-evaluation              AM-PAC PT "6 Clicks" Mobility   Outcome Measure  Help needed turning from your back to your side while in a flat bed without using bedrails?: Total Help needed moving from lying on your back to sitting on the side of a flat bed without using bedrails?: Total Help needed moving to and from a bed to a chair (including a wheelchair)?: Total Help needed standing up from a chair using your arms (e.g., wheelchair or bedside chair)?: Total Help needed to walk in hospital room?: Total Help needed climbing 3-5 steps with a railing? : Total 6 Click Score: 6    End of Session   Activity Tolerance: Patient tolerated treatment well Patient left: in chair;with call bell/phone within reach;with chair alarm set;with nursing/sitter in room(respiratory therapist) Nurse  Communication: Mobility status PT Visit Diagnosis: Other abnormalities of gait and mobility (R26.89);Muscle weakness (generalized) (M62.81)     Time: 1610-9604 PT Time Calculation (min) (ACUTE ONLY): 31 min  Charges:  $Therapeutic Exercise: 8-22 mins $Therapeutic Activity: 8-22 mins                     Lewis Shock, PT, DPT Acute Rehabilitation Services Pager #: [phone] Office #: [phone]    Iona Hansen 11/06/2019, 1:52 PM

## 2019-11-06 NOTE — Progress Notes (Signed)
1115 Up in chair at this time for PIV start. RN to re-evaluate whether a PIV or PICC exchange needed. Thank you.

## 2019-11-06 NOTE — Progress Notes (Signed)
NAME:  Brady [name], MRN:  [id number], DOB:  June 20, 1956, LOS: 57 ADMISSION DATE:  November 16, 2019, CONSULTATION DATE:  11/06/19 REFERRING MD:  Gilford Rile, CHIEF COMPLAINT:   Afib, hypotension  Brief History   64 year old male with past medical history of Pseudomonas/Serratia pneumonia and chronic hypercarbic respiratory failure requiring trach and vent dependent who was sent from [facility] for atrial fibrillation and hypotension.  This improved with IV fluids.  Imaging right lower lobe consolidation.  Admit to progressive care with PCCM consult for vent management  Past Medical History  Spinal stenosis, hepatitis C, hypertension, chronic respiratory failure trach and vent dependent  Significant Hospital Events   3/19-admit to progressive care 4/1-  A. fib RVR, transferred to 4 N.   Consults:  PCCM General surgery  Procedures:   Bronchoscopy 3/20 >> therapeutic aspiration of thick, purulent secretions and 1mm x 64mm chipped tooth suctioned from RLL  Bronchoscopy 4/3 >> therapeutic aspiration of secretions right greater than left  Significant Diagnostic Tests:  3/19 CT abdomen pelvis>>Mildly dilated fluid-filled loops of proximal and mid small bowel with transition to decompressed distal and terminal ileum suggestive of low-grade bowel obstruction 3/19 CT chest>>Complete collapse of the RLL secondary to aspirated debris within the RLL bronchus. Consolidation in the posterior segments of the RUL concerning for aspiration.Cholelithiasis with questionable gallbladder wall thickening. 3/19-3/20: significant stool burden on CT and KUB (personal interpretation) 3/23> Echo: LVEF 55-60% without RWMA and with trivial mitral valve regurgitation  Micro Data:  3/19 SARS-Covid-2>> negative 3/19 urine culture>> E. Faecalis  3/19 blood cultures x2>> negative x5 days 3/20 respiratory culture >> Serratia marcescens, R-cefazolin 3/29 blood cultures x2 >>ng  Antimicrobials:  Cefepime 3/19>>3/26 Flagyl  3/19 Vancomycin 3/19>3/21 Bactrim 3/26>>3/28  Interim history/subjective:   No overnight issues.  Has low-grade fevers with continuing copious secretions.  On pressure support currently.  Objective   Blood pressure 118/70, pulse 88, temperature (!) 100.8 F (38.2 C), temperature source Oral, resp. rate (!) 22, height 5\' 8"  (1.727 m), weight 135 kg, SpO2 99 %.    Vent Mode: PRVC FiO2 (%):  [40 %-50 %] 50 % Set Rate:  [12 bmp] 12 bmp Vt Set:  [500 mL] 500 mL PEEP:  [5 cmH20] 5 cmH20 Plateau Pressure:  [15 cmH20-20 cmH20] 16 cmH20   Intake/Output Summary (Last 24 hours) at 11/06/2019 0858 Last data filed at 11/06/2019 0800 Gross per 24 hour  Intake 1959.67 ml  Output 1750 ml  Net 209.67 ml   Filed Weights   11/02/19 0500 11/03/19 0500 11/05/19 0500  Weight: 133.4 kg 133.7 kg 135 kg   Gen:      No acute distress HEENT:  EOMI, sclera anicteric Neck:     No masses; no thyromegaly, trach Lungs:    Clear to auscultation bilaterally; normal respiratory effort CV:         Regular rate and rhythm; no murmurs Abd:      + bowel sounds; soft, non-tender; no palpable masses, no distension Ext:    No edema; adequate peripheral perfusion Skin:      Warm and dry; no rash Neuro: Awake, responds to questions   Resolved Hospital Problem list   SBO Assessment & Plan:  Right lower lobe collapse, chronic trach and vent dependent respiratory failure Serratia PNA, history of Pseudomonas pneumonia S/p bronchoscopy x2 Was tolerating trach collar trials until 3/29 Continue chest PT and hypertonic saline neb Repeat sputum cultures as he continues to have secretions with low-grade fevers today Check chest x-ray. Pressure support weans  as tolerated.  Ileus secondary to severe constipation with overflow diarrhea  Improved Continue tube feeds, as needed bowel regimen.  Atrial fibrillation/RVR  Continue IV amnio, telemetry monitoring   Chronic hypotension Unclear etiology; however may be  secondary to ongoing GI losses.  Continue midodrine 5mg  tid  MD [facility] Please see [email or web address] for pager details.  11/06/2019, 8:58 AM

## 2019-11-07 LAB — CBC
HCT: 32.1 % — ABNORMAL LOW (ref 39.0–52.0)
Hemoglobin: 10.2 g/dL — ABNORMAL LOW (ref 13.0–17.0)
MCH: 30.6 pg (ref 26.0–34.0)
MCHC: 31.8 g/dL (ref 30.0–36.0)
MCV: 96.4 fL (ref 80.0–100.0)
Platelets: 334 10*3/uL (ref 150–400)
RBC: 3.33 MIL/uL — ABNORMAL LOW (ref 4.22–5.81)
RDW: 14.9 % (ref 11.5–15.5)
WBC: 10.8 10*3/uL — ABNORMAL HIGH (ref 4.0–10.5)
nRBC: 0 % (ref 0.0–0.2)

## 2019-11-07 LAB — MAGNESIUM: Magnesium: 2 mg/dL (ref 1.7–2.4)

## 2019-11-07 LAB — GLUCOSE, CAPILLARY
Glucose-Capillary: 83 mg/dL (ref 70–99)
Glucose-Capillary: 83 mg/dL (ref 70–99)
Glucose-Capillary: 88 mg/dL (ref 70–99)
Glucose-Capillary: 104 mg/dL — ABNORMAL HIGH (ref 70–99)
Glucose-Capillary: 99 mg/dL (ref 70–99)
Glucose-Capillary: 96 mg/dL (ref 70–99)

## 2019-11-07 LAB — BASIC METABOLIC PANEL
Anion gap: 9 (ref 5–15)
BUN: 18 mg/dL (ref 8–23)
CO2: 31 mmol/L (ref 22–32)
Calcium: 8.5 mg/dL — ABNORMAL LOW (ref 8.9–10.3)
Chloride: 95 mmol/L — ABNORMAL LOW (ref 98–111)
Creatinine, Ser: <0.3 mg/dL — ABNORMAL LOW (ref 0.61–1.24)
Glucose, Bld: 104 mg/dL — ABNORMAL HIGH (ref 70–99)
Potassium: 3.6 mmol/L (ref 3.5–5.1)
Sodium: 135 mmol/L (ref 135–145)

## 2019-11-07 MED ORDER — POTASSIUM CHLORIDE 20 MEQ/15ML (10%) PO SOLN
40.0000 meq | Freq: Once | ORAL | Status: AC
  Administered 2019-11-07: 40 meq
  Filled 2019-11-07: qty 30

## 2019-11-07 MED ORDER — POTASSIUM CHLORIDE CRYS ER 20 MEQ PO TBCR
40.0000 meq | EXTENDED_RELEASE_TABLET | Freq: Once | ORAL | Status: DC
  Filled 2019-11-07: qty 2

## 2019-11-07 MED ORDER — MUSCLE RUB 10-15 % EX CREA
TOPICAL_CREAM | CUTANEOUS | Status: DC | PRN
  Administered 2019-11-07 – 2019-11-16 (×4): 1 via TOPICAL
  Filled 2019-11-07: qty 85

## 2019-11-07 NOTE — Progress Notes (Signed)
NAME:  Brady [name], MRN:  [id number], DOB:  12/08/1955, LOS: 72 ADMISSION DATE:  10-21-19, CONSULTATION DATE:  11/07/19 REFERRING MD:  Gilford Rile, CHIEF COMPLAINT:   Afib, hypotension  Brief History   64 year old male with past medical history of Pseudomonas/Serratia pneumonia and chronic hypercarbic respiratory failure requiring trach and vent dependent who was sent from [facility] for atrial fibrillation and hypotension.  This improved with IV fluids.  Imaging right lower lobe consolidation.  Admit to progressive care with PCCM consult for vent management  Past Medical History  Spinal stenosis, hepatitis C, hypertension, chronic respiratory failure trach and vent dependent  Significant Hospital Events   3/19-admit to progressive care 4/1-  A. fib RVR, transferred to 4 N.  Consults:  PCCM General surgery  Procedures:   Bronchoscopy 3/20 >> therapeutic aspiration of thick, purulent secretions and 52mm x 19mm chipped tooth suctioned from RLL  Bronchoscopy 4/3 >> therapeutic aspiration of secretions right greater than left  Significant Diagnostic Tests:  3/19 CT abdomen pelvis>>Mildly dilated fluid-filled loops of proximal and mid small bowel with transition to decompressed distal and terminal ileum suggestive of low-grade bowel obstruction 3/19 CT chest>>Complete collapse of the RLL secondary to aspirated debris within the RLL bronchus. Consolidation in the posterior segments of the RUL concerning for aspiration.Cholelithiasis with questionable gallbladder wall thickening. 3/19-3/20: significant stool burden on CT and KUB (personal interpretation) 3/23> Echo: LVEF 55-60% without RWMA and with trivial mitral valve regurgitation  Micro Data:  3/19 SARS-Covid-2>> negative 3/19 urine culture>> E. Faecalis  3/19 blood cultures x2>> negative x5 days 3/20 respiratory culture >> Serratia marcescens, R-cefazolin 3/29 blood cultures x2 >>ng  Antimicrobials:  Cefepime 3/19>>3/26 Flagyl  3/19 Vancomycin 3/19>3/21 Bactrim 3/26>>3/28  Interim history/subjective:   No overnight issues.  Has low-grade fevers with continuing copious secretions.  On pressure support currently.  Objective   Blood pressure 98/84, pulse 73, temperature 97.9 F (36.6 C), temperature source Oral, resp. rate 20, height 5\' 8"  (1.727 m), weight 134 kg, SpO2 98 %.    Vent Mode: CPAP;PSV FiO2 (%):  [50 %] 50 % Set Rate:  [12 bmp] 12 bmp Vt Set:  [500 mL] 500 mL PEEP:  [5 cmH20] 5 cmH20 Pressure Support:  [12 cmH20-15 cmH20] 15 cmH20 Plateau Pressure:  [13 cmH20-15 cmH20] 15 cmH20   Intake/Output Summary (Last 24 hours) at 11/07/2019 0837 Last data filed at 11/07/2019 0800 Gross per 24 hour  Intake 2381.61 ml  Output 2350 ml  Net 31.61 ml   Filed Weights   11/03/19 0500 11/05/19 0500 11/07/19 0500  Weight: 133.7 kg 135 kg 134 kg   Gen:      No acute distress HEENT:  EOMI, sclera anicteric Neck:     No masses; no thyromegaly Lungs:    Clear to auscultation bilaterally; normal respiratory effort CV:         Regular rate and rhythm; no murmurs Abd:      + bowel sounds; soft, non-tender; no palpable masses, no distension Ext:    No edema; adequate peripheral perfusion Skin:      Warm and dry; no rash Neuro: alert and oriented x 3 Psych: normal mood and affect  Labs significant for hemoglobin 10.8, Hb 10.2 No new imaging  Resolved Hospital Problem list   SBO Assessment & Plan:  Right lower lobe collapse, chronic trach and vent dependent respiratory failure Serratia PNA, history of Pseudomonas pneumonia S/p bronchoscopy x2 Was tolerating trach collar trials until 3/29 Continue chest PT and hypertonic saline neb  Follow sputum cx and cxr Will observe off antibiotics as he is afebrile and WBc count is better Pressure support weans as tolerated. Attempt trach collar.   Ileus secondary to severe constipation with overflow diarrhea  Improved Continue tube feeds, PRN bowel  regimen.  Transient Atrial fibrillation/RVR > now in sinus Stop amio, telemetry monitoring  Chronic hypotension Unclear etiology; however may be secondary to ongoing GI losses.  Continue midodrine 5mg  tid  MD [facility] Please see [email or web address] for pager details.  11/07/2019, 8:37 AM

## 2019-11-07 NOTE — TOC Progression Note (Addendum)
Transition of Care [facility]) - Progression Note    Patient Details  Name: Webster Patrone MRN: [id number] Date of Birth: [birth year]  [facility] CM/SW Contact  Lawerance Sabal, RN Phone Number: 11/07/2019, 3:31 PM  Clinical Narrative:     11/07/19 Spoke w patient at bedside, he states that he has both Medicare and Medicaid and gave me permission to speak to his wife. He stated again that he does not want to return to [facility].  Spoke w patient's wife on the phone. Discussed dispo plan. She states that patient does not have Medicaid. She states that she has applied several times however they are being denied because he has assets. She states that she does not have access to assets to use them to pay for the next level of care, or to "spend down" to be approved for Medicaid.   She states she lives at [address] and she is not interested in him going anywhere out of state. The Vent SNFs below are the options for [place].   Spoke w Damian Leavell at [facility] [phone], who states that patient would have to pay out of pocket $500/day until Medicaid was approved. She also states that they will not have beds for several days.  LVM w Janine Limbo at [facility] [phone]. They would also require Medicaid application/ Medicaid approval.  Spoke w Carinna at [facility] to evaluate if patient has any LTACH days left, and if they would accept him given the difficult disposition to SNF once he is optimized there. Patient would not have enough days prior to Copay days starting, and disposition after LTACH would be Vent SNF, that would require Medicaid. [facility] declined.   11/08/19 LVM with [name] CM at [facility] requesting callback to verify how many Medicare days patient has left and if [facility] had started [facility] application. [name]  [facility] [phone]         Expected Discharge Plan and Services                                                  Social Determinants of Health (SDOH) Interventions    Readmission Risk Interventions No flowsheet data found.

## 2019-11-07 NOTE — Progress Notes (Addendum)
  Subjective:  Brady [name] was examined and evaluated at bedside this am. He was observed sleeping comfortably in bed. Upon awakening, he mentions being tired. No other complaints at this time.  Objective:    Vital Signs (last 24 hours): Vitals:   11/07/19 0300 11/07/19 0400 11/07/19 0424 11/07/19 0500  BP: (!) 103/56 (!) 120/58 (!) 120/58 (!) 91/53  Pulse: 78 77 73 65  Resp: (!) 23 (!) 28 17 15   Temp:  97.8 F (36.6 C)    TempSrc:  Axillary    SpO2: 100% 100% 100% 100%  Weight:    134 kg  Height:       Physical Exam: General Alert and answers questions appropriately, no acute distress  Cardiac Regular rate and rhythm, no murmurs, rubs, or gallops  Pulmonary Clear to auscultation bilaterally without wheezes, rhonchi, or rales  Abdominal Soft, non-tender, without distention    CBC Latest Ref Rng & Units 11/07/2019 11/05/2019 11/04/2019  WBC 4.0 - 10.5 K/uL 10.8(H) 15.5(H) 12.6(H)  Hemoglobin 13.0 - 17.0 g/dL 10.2(L) 12.1(L) 11.0(L)  Hematocrit 39.0 - 52.0 % 32.1(L) 37.3(L) 34.0(L)  Platelets 150 - 400 K/uL 334 322 312    Assessment/Plan:   Active Problems:   Hypotension   Lactic acidosis   Small bowel obstruction (HCC)   VAP (ventilator-associated pneumonia) (HCC)   Acute on chronic respiratory failure (HCC)   S/P bronchoscopy   Pressure injury of sacral region, stage 1  Patient is a 64 year old male with past medical history of insulin-dependent respiratory failure, hyperlipidemia, atypical depression, COPD, chronic lower back pain admitted on 10/30/2019 from [facility] with SBO and recurrence ulceration pneumonia.  # SBO Present on admission, treated with enema, laxatives, resolved. *Continue tube feeds *Continue with as needed MiraLAX, Senokot  # Chronic hypoxic respiratory failure Ventilator dependent, awaiting placement for LTAC, bronchoscopy on 11/04/2019 revealed 150 cc of mucus which was suctioned out. Febrile to 100.8 x1 yesterday at 8am, received tylenol *Appreciate  PCCM assistance with vent management. Repeat CXR performed yesterday revealing persistent atelectasis/consolidation at right lung base, diminished right effusion.  *Wean collar BID *Discharge once LTAC placement acquired *Follow sputum culture and CXR  Paroxysmal A.Fib Currently in sinus. CHAD-VASC2 score of 1 - C/w telemetry - Amiodarone stopped  PT/OT: Following, we appreciate their assistance Diet: Tube feeds DVT Ppx: Lovenox 40 mg daily Admit Status: Inpatient Dispo: Anticipated discharge pending placement  01/04/2020, MD 11/07/2019, 6:28 AM

## 2019-11-08 LAB — BASIC METABOLIC PANEL
Anion gap: 8 (ref 5–15)
BUN: 17 mg/dL (ref 8–23)
CO2: 32 mmol/L (ref 22–32)
Calcium: 8.8 mg/dL — ABNORMAL LOW (ref 8.9–10.3)
Chloride: 96 mmol/L — ABNORMAL LOW (ref 98–111)
Creatinine, Ser: <0.3 mg/dL — ABNORMAL LOW (ref 0.61–1.24)
Glucose, Bld: 90 mg/dL (ref 70–99)
Potassium: 4.1 mmol/L (ref 3.5–5.1)
Sodium: 136 mmol/L (ref 135–145)

## 2019-11-08 LAB — CULTURE, RESPIRATORY W GRAM STAIN: Special Requests: NORMAL

## 2019-11-08 LAB — CBC
HCT: 32.8 % — ABNORMAL LOW (ref 39.0–52.0)
Hemoglobin: 10.4 g/dL — ABNORMAL LOW (ref 13.0–17.0)
MCH: 30.9 pg (ref 26.0–34.0)
MCHC: 31.7 g/dL (ref 30.0–36.0)
MCV: 97.3 fL (ref 80.0–100.0)
Platelets: 397 10*3/uL (ref 150–400)
RBC: 3.37 MIL/uL — ABNORMAL LOW (ref 4.22–5.81)
RDW: 14.9 % (ref 11.5–15.5)
WBC: 10.8 10*3/uL — ABNORMAL HIGH (ref 4.0–10.5)
nRBC: 0 % (ref 0.0–0.2)

## 2019-11-08 LAB — GLUCOSE, CAPILLARY
Glucose-Capillary: 88 mg/dL (ref 70–99)
Glucose-Capillary: 84 mg/dL (ref 70–99)
Glucose-Capillary: 99 mg/dL (ref 70–99)
Glucose-Capillary: 82 mg/dL (ref 70–99)
Glucose-Capillary: 97 mg/dL (ref 70–99)
Glucose-Capillary: 102 mg/dL — ABNORMAL HIGH (ref 70–99)

## 2019-11-08 LAB — MAGNESIUM: Magnesium: 2.1 mg/dL (ref 1.7–2.4)

## 2019-11-08 MED ORDER — SODIUM CHLORIDE 3 % IN NEBU
4.0000 mL | INHALATION_SOLUTION | Freq: Two times a day (BID) | RESPIRATORY_TRACT | Status: AC
  Administered 2019-11-08 – 2019-11-10 (×6): 4 mL via RESPIRATORY_TRACT
  Filled 2019-11-08 (×6): qty 4

## 2019-11-08 NOTE — Progress Notes (Signed)
  Subjective:  Patient seen at bedside, patient states that he thought he had Medicaid but not sure if it changed.  Patient counseled on plan of care, all questions answered.  Objective:    Vital Signs (last 24 hours): Vitals:   11/08/19 0350 11/08/19 0400 11/08/19 0500 11/08/19 0600  BP:  (!) 99/53 98/89 97/65   Pulse: 80 77 76 78  Resp: 18 17 17 17   Temp:  97.7 F (36.5 C)    TempSrc:  Axillary    SpO2: 94% 95% 98% 94%  Weight:   132 kg   Height:        Physical Exam: General Alert and answers questions appropriately, no acute distress  Cardiac Regular rate and rhythm, no murmurs, rubs, or gallops  Pulmonary Trach in place, no distress, bilateral air movement    BMP Latest Ref Rng & Units 11/08/2019 11/07/2019 11/05/2019  Glucose 70 - 99 mg/dL 90 01/07/2020) 88  BUN 8 - 23 mg/dL 17 18 15   Creatinine 0.61 - 1.24 mg/dL 01/05/2020) 488(Q) )  Sodium 135 - 145 mmol/L 136 135 138  Potassium 3.5 - 5.1 mmol/L 4.1 3.6 4.5  Chloride 98 - 111 mmol/L 96(L) 95(L) 96(L)  CO2 22 - 32 mmol/L 32 31 32  Calcium 8.9 - 10.3 mg/dL <9.16(X) <4.50(T) <8.88(K)   CBC Latest Ref Rng & Units 11/08/2019 11/07/2019 11/05/2019  WBC 4.0 - 10.5 K/uL 10.8(H) 10.8(H) 15.5(H)  Hemoglobin 13.0 - 17.0 g/dL 10.4(L) 10.2(L) 12.1(L)  Hematocrit 39.0 - 52.0 % 32.8(L) 32.1(L) 37.3(L)  Platelets 150 - 400 K/uL 397 334 322   Magnesium: 2.1   Assessment/Plan:   Active Problems:   Hypotension   Lactic acidosis   Small bowel obstruction (HCC)   VAP (ventilator-associated pneumonia) (HCC)   Acute on chronic respiratory failure (HCC)   S/P bronchoscopy   Pressure injury of sacral region, stage 1  Patient is a 64 year old male with past medical history of significant for ventilatory dependent respiratory failure, hyperlipidemia, atypical depression, COPD, chronic lower back pain admitted on 02-Nov-2019 from [facility] with SBO and recurrence of Serratia pneumonia.  # SBO Present on admission, treated with enema, laxatives,  resolved *Continue tube feeds *Continue with as needed MiraLAX, snokot  # Chronic hypoxic respiratory failure Ventilator dependent, awaiting placement for LTAC, bronchoscopy on 12/01/2019 via 1 to 50 cc of mucus which was suctioned out, repeat chest x-ray revealed persistent atelectasis/consolidation at the right lung base.  Patient was febrile to 100.8 in a.m. of 11/07/2019, has remained afebrile since. *Appreciate PCCM assistance with vent management. Repeat CXR performed yesterday revealing persistent atelectasis/consolidation at right lung base, diminished right effusion.  *Sputum culture reveals few serratia marcescens, PCCM advises this is likely 2/2 colonization, no antibiotics unless fevers or has leukocytosis *Wean collar BID *Discharge once LTAC placement acquired  Paroxysmal A.Fib Currently in sinus. CHAD-VASC2 score of 1 - C/w telemetry  PT/OT: Following, we appreciate their assistance Diet: Tube feeds DVT Ppx: Lovenox 65 mg daily Admit Status: Inpatient Dispo: Anticipated discharge pending placement  10/22/2019, MD 11/08/2019, 7:27 AM

## 2019-11-08 NOTE — Progress Notes (Signed)
SLP Cancellation Note  Patient Details Name: Brady [name] MRN: [id number] DOB: Nov 14, 1955   Cancelled treatment:       Reason Eval/Treat Not Completed: Medical issues which prohibited therapy. Attempted to see pt today but per RN he only spent about 30 minutes off the vent. Will continue to follow.     Mahala Menghini., M.A. CCC-SLP Acute Rehabilitation Services Pager [phone] Office [phone]  11/08/2019, 2:50 PM

## 2019-11-08 NOTE — Progress Notes (Signed)
Patient refused CPT with the vest states that he cannot tolerate it. RT will continue to monitor pt.

## 2019-11-08 NOTE — Progress Notes (Signed)
NAME:  Brady [name], MRN:  [id number], DOB:  [birth year], LOS: 68 ADMISSION DATE:  10/05/2019, CONSULTATION DATE:  11/08/19 REFERRING MD:  Gilford Rile, CHIEF COMPLAINT:   Afib, hypotension  Brief History   64 year old male with past medical history of Pseudomonas/Serratia pneumonia and chronic hypercarbic respiratory failure requiring trach and vent dependent who was sent from [facility] for atrial fibrillation and hypotension.  This improved with IV fluids.  Imaging shows right lower lobe consolidation.  Admit to progressive care with PCCM consult for vent management  Past Medical History  Spinal stenosis, hepatitis C, hypertension, chronic respiratory failure trach and vent dependent  Significant Hospital Events   3/19-admit to progressive care 4/1-  A. fib RVR, transferred to 4 N.  Consults:  PCCM General surgery  Procedures:   Bronchoscopy 3/20 >> therapeutic aspiration of thick, purulent secretions and 16mm x 74mm chipped tooth suctioned from RLL  Bronchoscopy 4/3 >> therapeutic aspiration of secretions right greater than left  Significant Diagnostic Tests:  3/19 CT abdomen pelvis>>Mildly dilated fluid-filled loops of proximal and mid small bowel with transition to decompressed distal and terminal ileum suggestive of low-grade bowel obstruction 3/19 CT chest>>Complete collapse of the RLL secondary to aspirated debris within the RLL bronchus. Consolidation in the posterior segments of the RUL concerning for aspiration.Cholelithiasis with questionable gallbladder wall thickening. 3/19-3/20: significant stool burden on CT and KUB (personal interpretation) 3/23> Echo: LVEF 55-60% without RWMA and with trivial mitral valve regurgitation  Micro Data:  3/19 SARS-Covid-2>> negative 3/19 urine culture>> E. Faecalis  3/19 blood cultures x2>> negative x5 days 3/20 respiratory culture >> Serratia marcescens, R-cefazolin 3/29 blood cultures x2 >>ng 4/5 Trach asp >> few  serrratia  Antimicrobials:  Cefepime 3/19>>3/26 Flagyl 3/19 Vancomycin 3/19>3/21 Bactrim 3/26>>3/28  Interim history/subjective:   No overnight issues.  Has low-grade fevers with continuing copious secretions.  On pressure support currently.  Objective   Blood pressure (!) 110/95, pulse 96, temperature 98.4 F (36.9 C), temperature source Oral, resp. rate 20, height 5\' 8"  (1.727 m), weight 132 kg, SpO2 97 %.    Vent Mode: PRVC FiO2 (%):  [50 %-100 %] 50 % Set Rate:  [12 bmp] 12 bmp Vt Set:  [500 mL] 500 mL PEEP:  [5 cmH20] 5 cmH20 Plateau Pressure:  [16 cmH20-20 cmH20] 19 cmH20   Intake/Output Summary (Last 24 hours) at 11/08/2019 0943 Last data filed at 11/08/2019 9242 Gross per 24 hour  Intake 2130 ml  Output 2100 ml  Net 30 ml   Filed Weights   11/05/19 0500 11/07/19 0500 11/08/19 0500  Weight: 135 kg 134 kg 132 kg   Gen:      No acute distress HEENT:  EOMI, sclera anicteric Neck:     No masses; no thyromegaly, trach Lungs:    Clear to auscultation bilaterally; normal respiratory effort CV:         Regular rate and rhythm; no murmurs Abd:      + bowel sounds; soft, non-tender; no palpable masses, no distension Ext:    No edema; adequate peripheral perfusion Skin:      Warm and dry; no rash Neuro: Awake, responds to questions  Labs are stable No new imaging   Resolved Hospital Problem list   SBO Assessment & Plan:  Right lower lobe collapse, chronic trach and vent dependent respiratory failure Serratia PNA, history of Pseudomonas pneumonia S/p bronchoscopy x2 Was tolerating trach collar trials until 3/29.  Will attempt to get him back to trach collar as respiratory status is  improved Continue chest PT and hypertonic saline neb Sputum cultures noted with Serratia.  Is likely colonization at this point.  No antibiotics unless he has fevers, leukocytosis Intermittent chest x-ray  Ileus secondary to severe constipation with overflow diarrhea  Improved Continue  tube feeds, as needed bowel regimen.  Transient Atrial fibrillation/RVR >> in NSR now Off amiodarone Telemetry monitoring  Chronic hypotension Unclear etiology; however may be secondary to ongoing GI losses.  Continue midodrine 5mg  tid  MD [facility] Please see [email or web address] for pager details.  11/08/2019, 9:43 AM

## 2019-11-08 NOTE — Progress Notes (Addendum)
Occupational Therapy Treatment Patient Details Name: Brady [name] MRN: [id number] DOB: 1956/05/01 Today's Date: 11/08/2019    History of present illness Pt is a 64 yo male s/p ileus/SBO. Pt vented with trach since Jan 2021. PMHx; pt reports 12 spincal surgeries, Spinal stenosis, hepatitis C, hypertension, chronic respiratory failure trach and vent dependent.   OT comments  Pt agreeable to working with therapy today. He engaged in bil UE/LE A/AAROM and stretching within tolerance for continued strengthening and endurance as precursor to EOB/OOB tasks. Pt tolerating well with VSS (on vent with PEEP 5, FiO2 50%). Pt mouthing words/needs during session, attempted to have pt write down what he was trying to communicate but pt declining. Pt initially on L side upon arrival, repositioned to R sidelying end of session for continued pressure relief. Will continue per POC at this time.   Follow Up Recommendations  SNF;LTACH    Equipment Recommendations  Other (comment)(TBD)          Precautions / Restrictions Precautions Precautions: Fall;Other (comment) Precaution Comments: trach, vent Restrictions Weight Bearing Restrictions: No       Mobility Bed Mobility Overal bed mobility: Needs Assistance Bed Mobility: Rolling Rolling: Max assist;+2 for physical assistance         General bed mobility comments: pt on L side upon arrival, assisted with rolling/positioning on R side end of session        ADL either performed or assessed with clinical judgement   ADL Overall ADL's : Needs assistance/impaired                                       General ADL Comments: focus on UB/LB ROM, repositioning for pressure relief/comfort during session                       Cognition Arousal/Alertness: Awake/alert Behavior During Therapy: WFL for tasks assessed/performed Overall Cognitive Status: Difficult to assess                                  General Comments: pt mouthing words during session, frustrated when therapist not able to understand pt; pt declined attempts to have him write his needs down         Exercises Exercises: General Lower Extremity;General Upper Extremity;Other exercises General Exercises - Upper Extremity Shoulder Flexion: AROM;AAROM;Both;10 reps(increased assist on RUE) General Exercises - Lower Extremity Ankle Circles/Pumps: AAROM;Both;Supine;5 reps Quad Sets: AAROM;Both;10 reps;Supine Heel Slides: AAROM;Supine;Both;10 reps Hip Flexion/Marching: AAROM;Both;10 reps Other Exercises Other Exercises: chest press x10, bil UE, pt using LUE to assist RUE   Shoulder Instructions       General Comments      Pertinent Vitals/ Pain       Pain Assessment: Faces Faces Pain Scale: Hurts little more Pain Location: L LE with movement, buttocks Pain Descriptors / Indicators: Discomfort;Grimacing Pain Intervention(s): Limited activity within patient's tolerance;Monitored during session;Repositioned  Home Living                                          Prior Functioning/Environment              Frequency  Min 2X/week        Progress Toward Goals  OT Goals(current  goals can now be found in the care plan section)  Progress towards OT goals: Progressing toward goals  Acute Rehab OT Goals Patient Stated Goal: find another rehab facility OT Goal Formulation: With patient Time For Goal Achievement: 11/22/19 Potential to Achieve Goals: Good ADL Goals Pt Will Perform Grooming: with min guard assist;sitting Pt Will Perform Upper Body Dressing: with min assist;sitting Pt/caregiver will Perform Home Exercise Program: Increased strength;Right Upper extremity;Left upper extremity;With Supervision Additional ADL Goal #1: Pt will increase to modA for bed mobility as precursor for OOB ADL tasks. Additional ADL Goal #2: Pt will tolerate sitting EOB x10 mins with minguardA overall in prep  for OOB ADL.  Plan Discharge plan remains appropriate    Co-evaluation                 AM-PAC OT "6 Clicks" Daily Activity     Outcome Measure   Help from another person eating meals?: Total Help from another person taking care of personal grooming?: A Lot Help from another person toileting, which includes using toliet, bedpan, or urinal?: Total Help from another person bathing (including washing, rinsing, drying)?: Total Help from another person to put on and taking off regular upper body clothing?: Total Help from another person to put on and taking off regular lower body clothing?: Total 6 Click Score: 7    End of Session Equipment Utilized During Treatment: Oxygen  OT Visit Diagnosis: Muscle weakness (generalized) (M62.81);Pain Pain - Right/Left: Left Pain - part of body: Leg   Activity Tolerance Patient tolerated treatment well   Patient Left in bed;with call bell/phone within reach;with nursing/sitter in room   Nurse Communication Mobility status        Time: 3762-8315 OT Time Calculation (min): 23 min  Charges: OT General Charges $OT Visit: 1 Visit OT Treatments $Therapeutic Activity: 23-37 mins  Lou Cal, OT Acute Rehabilitation Services Pager [phone] Office [phone]    Raymondo Band 11/08/2019, 5:20 PM

## 2019-11-09 DIAGNOSIS — J95851 Ventilator associated pneumonia: Secondary | ICD-10-CM

## 2019-11-09 LAB — GLUCOSE, CAPILLARY
Glucose-Capillary: 87 mg/dL (ref 70–99)
Glucose-Capillary: 90 mg/dL (ref 70–99)
Glucose-Capillary: 95 mg/dL (ref 70–99)
Glucose-Capillary: 99 mg/dL (ref 70–99)
Glucose-Capillary: 95 mg/dL (ref 70–99)

## 2019-11-09 LAB — CBC
HCT: 34.6 % — ABNORMAL LOW (ref 39.0–52.0)
Hemoglobin: 10.9 g/dL — ABNORMAL LOW (ref 13.0–17.0)
MCH: 30.4 pg (ref 26.0–34.0)
MCHC: 31.5 g/dL (ref 30.0–36.0)
MCV: 96.6 fL (ref 80.0–100.0)
Platelets: 416 K/uL — ABNORMAL HIGH (ref 150–400)
RBC: 3.58 MIL/uL — ABNORMAL LOW (ref 4.22–5.81)
RDW: 14.8 % (ref 11.5–15.5)
WBC: 10.9 K/uL — ABNORMAL HIGH (ref 4.0–10.5)
nRBC: 0 % (ref 0.0–0.2)

## 2019-11-09 LAB — MAGNESIUM: Magnesium: 1.7 mg/dL (ref 1.7–2.4)

## 2019-11-09 MED ORDER — HYDROXYZINE HCL 10 MG PO TABS
10.0000 mg | ORAL_TABLET | Freq: Once | ORAL | Status: AC
  Administered 2019-11-09: 11:00:00 10 mg via ORAL
  Filled 2019-11-09: qty 1

## 2019-11-09 MED ORDER — HYDROXYZINE HCL 10 MG PO TABS
10.0000 mg | ORAL_TABLET | Freq: Three times a day (TID) | ORAL | Status: DC | PRN
  Administered 2019-11-11: 10 mg via ORAL
  Filled 2019-11-09 (×3): qty 1

## 2019-11-09 MED ORDER — SULFAMETHOXAZOLE-TRIMETHOPRIM 200-40 MG/5ML PO SUSP
41.0000 mL | Freq: Four times a day (QID) | ORAL | Status: AC
  Administered 2019-11-09 – 2019-11-14 (×20): 41 mL
  Filled 2019-11-09 (×21): qty 41

## 2019-11-09 MED ORDER — MAGNESIUM SULFATE 2 GM/50ML IV SOLN
2.0000 g | Freq: Once | INTRAVENOUS | Status: AC
  Administered 2019-11-09: 11:00:00 2 g via INTRAVENOUS
  Filled 2019-11-09: qty 50

## 2019-11-09 NOTE — Progress Notes (Signed)
SLP Cancellation Note  Patient Details Name: Cordarious Zeek MRN: [id number] DOB: [birth year]   Cancelled treatment:       Reason Eval/Treat Not Completed: Medical issues which prohibited therapy Pt continues on ventilator support.    Aldon Hengst 11/09/2019, 4:11 PM

## 2019-11-09 NOTE — Progress Notes (Signed)
NAME:  Reshard Guillet, MRN:  [id number], DOB:  28-Jun-1956, LOS: 20 ADMISSION DATE:  10/19/2019, CONSULTATION DATE:  11/09/19 REFERRING MD:  Sande Brothers, CHIEF COMPLAINT:   Afib, hypotension  Brief History   64 year old male with past medical history of Pseudomonas/Serratia pneumonia and chronic hypercarbic respiratory failure requiring trach and vent dependent who was sent from [facility] for atrial fibrillation and hypotension.  This improved with IV fluids.  Imaging shows right lower lobe consolidation.  Admit to progressive care with PCCM consult for vent management  Past Medical History  Spinal stenosis, hepatitis C, hypertension, chronic respiratory failure trach and vent dependent  Significant Hospital Events   3/19-admit to progressive care 4/1-  A. fib RVR, transferred to 4 N.  Consults:  PCCM General surgery  Procedures:   Bronchoscopy 3/20 >> therapeutic aspiration of thick, purulent secretions and 50mm x 31mm chipped tooth suctioned from RLL  Bronchoscopy 4/3 >> therapeutic aspiration of secretions right greater than left  Significant Diagnostic Tests:  3/19 CT abdomen pelvis>>Mildly dilated fluid-filled loops of proximal and mid small bowel with transition to decompressed distal and terminal ileum suggestive of low-grade bowel obstruction 3/19 CT chest>>Complete collapse of the RLL secondary to aspirated debris within the RLL bronchus. Consolidation in the posterior segments of the RUL concerning for aspiration.Cholelithiasis with questionable gallbladder wall thickening. 3/19-3/20: significant stool burden on CT and KUB (personal interpretation) 3/23> Echo: LVEF 55-60% without RWMA and with trivial mitral valve regurgitation  Micro Data:  3/19 SARS-Covid-2>> negative 3/19 urine culture>> E. Faecalis  3/19 blood cultures x2>> negative x5 days 3/20 respiratory culture >> Serratia marcescens, R-cefazolin 3/29 blood cultures x2 >>ng 4/5 Trach asp >> few  serrratia  Antimicrobials:  Cefepime 3/19>>3/26 Flagyl 3/19 Vancomycin 3/19>3/21 Bactrim 3/26>>3/28  Interim history/subjective:   No sig change overnight.  Tol ATC x yesterday.   Objective   Blood pressure 121/77, pulse 89, temperature 98.2 F (36.8 C), temperature source Axillary, resp. rate (!) 21, height 5\' 8"  (1.727 m), weight 132 kg, SpO2 95 %.    Vent Mode: PRVC FiO2 (%):  [50 %-60 %] 50 % Set Rate:  [12 bmp] 12 bmp Vt Set:  [500 mL] 500 mL PEEP:  [5 cmH20] 5 cmH20 Plateau Pressure:  [16 cmH20-24 cmH20] 24 cmH20   Intake/Output Summary (Last 24 hours) at 11/09/2019 0849 Last data filed at 11/09/2019 0700 Gross per 24 hour  Intake 826 ml  Output 3125 ml  Net -2299 ml   Filed Weights   11/05/19 0500 11/07/19 0500 11/08/19 0500  Weight: 135 kg 134 kg 132 kg   Gen:      No acute distress in bed on vent  HEENT:  Mm moist, trach c/d  Neck:     No masses; no thyromegaly, trach Lungs:    Resps even non labored on vent CV:         Regular rate and rhythm; no murmurs Abd:      + bowel sounds; soft, non-tender; no palpable masses, no distension Ext:    No edema; adequate peripheral perfusion Skin:      Warm and dry; no rash Neuro:  Awake, follows commands, responds appropriately    No new imaging   Resolved Hospital Problem list   SBO Assessment & Plan:  Right lower lobe collapse, chronic trach and vent dependent respiratory failure Serratia PNA, history of Pseudomonas pneumonia S/p bronchoscopy x2 Was tolerating trach collar trials until 3/29.  Continue attempts at [facility] - unclear why he is not making  more progress with weaning.  Continue PT efforts.  Continue chest PT and hypertonic saline neb - pt refusing at times  Sputum cultures noted with Serratia.  Is likely colonization at this point.  Hold off on antibiotics unless he has fevers, leukocytosis F/u CXR 4/8  Ileus secondary to severe constipation with overflow diarrhea  Improved Continue tube feeds, as  needed bowel regimen.  Transient Atrial fibrillation/RVR >> in NSR now Off amiodarone Telemetry monitoring  Chronic hypotension Unclear etiology; however may be secondary to ongoing GI losses.  Continue midodrine 5mg  tid  PCCM will f/u 2-3x weekly. Please call sooner if needed.    Nickolas Madrid, NP Pulmonary/Critical Care Medicine  11/09/2019  8:49 AM

## 2019-11-09 NOTE — Progress Notes (Signed)
Nutrition Follow-up  DOCUMENTATION CODES:   Morbid obesity  INTERVENTION:   Tube Feeding via PEG: Vital 1.5 at 40 ml/hr Pro-Stat 30 mL 5 times daily Provides 1940 kcals, 140 g of protein and 730 mL of free water  Free water to 200 mL q 4 hours Total free water from TF and flushes: 1930 mL   MVI with Minerals  NUTRITION DIAGNOSIS:   Inadequate oral intake related to inability to eat as evidenced by NPO status.  Being addressed via TF   GOAL:   Patient will meet greater than or equal to 90% of their needs  Meeting with TF   MONITOR:   TF tolerance, Diet advancement, Labs, Weight trends, Skin  REASON FOR ASSESSMENT:   Consult Enteral/tube feeding initiation and management  ASSESSMENT:   64 yo male admitted with RLL collapse with aspiration and mucous plugging, ileus secondary to severe constipation. PMH includes spinal stenosis, hepatitis C, HTN, chronic respiratory failure with trach on vent support with PEG tube since 09/2019  3/19 Admitted, CT abdomen suggestive of low grade bowel obstruction 3/20 Bronch: chipped tooth (3 mm x 5 mm) removed from RLL, thick, purulent secretions removed from RML  Pt remains on vent support via trach, per CCM pt weaning on TC but very little (30 mintues)    Labs: reviewed  Meds: reviewed MVI   Diet Order:   Diet Order            Diet NPO time specified  Diet effective now              EDUCATION NEEDS:   Not appropriate for education at this time  Skin:  Skin Assessment: Skin Integrity Issues: Skin Integrity Issues:: Stage I Stage I: sacrum Other: MASD to bilateral buttock  Last BM:  50 ml via rectal tube  Height:   Ht Readings from Last 1 Encounters:  10/11/2019 5\' 8"  (1.727 m)    Weight:   Wt Readings from Last 1 Encounters:  11/08/19 132 kg    Ideal Body Weight:  70 kg  BMI:  Body mass index is 44.25 kg/m.  Estimated Nutritional Needs:   Kcal:  1550-1750 kcals  Protein:  140-175  Fluid:  >/=  2 L  Tabatha Razzano P., RD, LDN, CNSC See AMiON for contact information

## 2019-11-09 NOTE — Progress Notes (Signed)
  Subjective:  Mr.Copen was examined and evaluated at bedside this am. He mentions having significant itching of his back. Denies any other complaints.  Objective:    Vital Signs (last 24 hours): Vitals:   11/09/19 0300 11/09/19 0342 11/09/19 0400 11/09/19 0402  BP: 100/75  114/64   Pulse: 84  74 73  Resp: 20 17  17   Temp:   98.2 F (36.8 C)   TempSrc:   Axillary   SpO2: 95% 98%  99%  Weight:      Height:       Physical Exam: General Alert and answers questions appropriately, no acute distress  Cardiac Regular rate and rhythm, no murmurs, rubs, or gallops  Pulmonary Trach in place, no distress, bilateral air movement   Assessment/Plan:   Active Problems:   Hypotension   Lactic acidosis   Small bowel obstruction (HCC)   VAP (ventilator-associated pneumonia) (HCC)   Acute on chronic respiratory failure (HCC)   S/P bronchoscopy   Pressure injury of sacral region, stage 1  Patient is a 64 year old male with past medical history significant for ventilator dependent respiratory failure, hyperlipidemia, atypical depression, COPD, chronic lower back pain admitted on 30-Oct-2019 from [facility] with SBO and recurrence of Serratia pneumonia.  # SBO Present on admission, treated with enema, laxatives, resolved *Continue tube feeds *Continue with as needed MiraLAX, snokot  # Chronic hypoxic respiratory failure Ventilatory dependent, awaiting placement for LTAC, bronchoscopy on 11/04/2019 revealed 150 cc of mucus which was suctioned out.  Patient has remained afebrile since a.m. of 11/06/2019.  Repeat chest x-ray performed on 11/07/2019 revealed persistent atelectasis/consolidation at the right lung base.  *Appreciate PCCM assistance with vent management.  *Sputum culture reveals few serratia marcescens, with continued inability to wean, plan to treat for 5 more days with Bactrim per PCCM *Wean collar BID *Discharge once LTAC placement acquired  Paroxysmal A.Fib Currently in sinus.  CHAD-VASC2 score of 1 - C/w telemetry  PT/OT:Following, we appreciate their assistance Diet:Tube feeds DVT 01/07/2020 65 mg daily Admit Status:Inpatient Dispo: Anticipated dischargepending placement  ZCH:YIFOYDX, MD 11/09/2019, 6:25 AM

## 2019-11-09 NOTE — Progress Notes (Signed)
RT holding CPT at this time due to patient in chair and also working with PT. RT will attempt at next scheduled time.

## 2019-11-09 NOTE — Progress Notes (Signed)
Physical Therapy Treatment Patient Details Name: Brady [name] MRN: [id number] DOB: 1955/10/14 Today's Date: 11/09/2019    History of Present Illness Pt is a 64 yo male admitted from [facility] 10/05/2019 due to ileus/SBO. Pt admitted 08/28/19 to [facility] hospital due to pneumonia with hypoxia; requred ventilator with trach PMHx; ACDF 07/19/2018 for myelopathy, Lt foot drop peroneal nerve injury, pt reports 12 spincal surgeries, Spinal stenosis, hepatitis C, hypertension, chronic respiratory failure trach and vent dependent.    PT Comments    Patient given choice of working on bed mobility and sitting EOB or lifted OOB to chair and work on balance and strengthening in chair. Pt wanted to get OOB. Used maxi-sky with incr time due to multiple lines/tubes. Once seated abe to use bil UEs to work on strengthening and balance with unsupported sitting at edge of chair. PROM/AAROM to bil LEs completed. Patient up in recliner on departure. Discussed plan for back to bed with nursing via Maxi-sky with Brady Shames, RN.     Follow Up Recommendations  SNF;LTACH     Equipment Recommendations  Other (comment)    Recommendations for Other Services       Precautions / Restrictions Precautions Precautions: Fall;Other (comment) Precaution Comments: trach, vent Restrictions Weight Bearing Restrictions: No    Mobility  Bed Mobility Overal bed mobility: Needs Assistance Bed Mobility: Rolling Rolling: Max assist;+2 for physical assistance         General bed mobility comments: pt rolled L/R to place maxi sky lift pad, pt turning head and reaching each arm across for railing to assist  Transfers Overall transfer level: Needs assistance   Transfers: (OOB to chair)           General transfer comment:  Pt tolerated transfer well with sats briefly 88-89% on PRVC 60% fiO2, recovered to 92-93% with rest  Ambulation/Gait                 Stairs             Wheelchair Mobility    Modified  Rankin (Stroke Patients Only)       Balance   Sitting-balance support: Bilateral upper extremity supported;Feet supported(pull to sit upright in chair) Sitting balance-Leahy Scale: Poor Sitting balance - Comments: leans to his right                                    Cognition Arousal/Alertness: Awake/alert Behavior During Therapy: WFL for tasks assessed/performed Overall Cognitive Status: Difficult to assess                                 General Comments: pt mouthing words and understandable majority of time. Pt compliant and okay with getting OOB      Exercises General Exercises - Lower Extremity Ankle Circles/Pumps: PROM;Left;AAROM;Right;10 reps Quad Sets: AAROM;Both;Seated(in recliner, no activation of quads palpable) Other Exercises Other Exercises: from supported sitting in recliner with feet on the floor, pt able to use bil UEs on armrests to pull torso forward into unsupported sitting with moderate assistance x 4 reps during session. Able to maintain forward/upright posture with min assist to allow PT to put lotion on his back Other Exercises: attempted LAQ with no activation of quads noted Other Exercises: in reclined position, worked on hip internal rotation (able to achieve neutral with bil LEs; rolled pads placed to lateral thighs to  maintain neutral and allow knee extensor stretch    General Comments General comments (skin integrity, edema, etc.): VSS throughout session. Pt thankful for getting OOB to chair.       Pertinent Vitals/Pain Pain Assessment: No/denies pain    Home Living                      Prior Function            PT Goals (current goals can now be found in the care plan section) Acute Rehab PT Goals Patient Stated Goal: agrees to OOB to recliner Time For Goal Achievement: 11/20/19 Potential to Achieve Goals: Fair Progress towards PT goals: Progressing toward goals    Frequency    Min  2X/week      PT Plan Current plan remains appropriate    Co-evaluation              AM-PAC PT "6 Clicks" Mobility   Outcome Measure  Help needed turning from your back to your side while in a flat bed without using bedrails?: Total Help needed moving from lying on your back to sitting on the side of a flat bed without using bedrails?: Total Help needed moving to and from a bed to a chair (including a wheelchair)?: Total Help needed standing up from a chair using your arms (e.g., wheelchair or bedside chair)?: Total Help needed to walk in hospital room?: Total Help needed climbing 3-5 steps with a railing? : Total 6 Click Score: 6    End of Session   Activity Tolerance: Patient tolerated treatment well Patient left: in chair;with call bell/phone within reach;with chair alarm set(respiratory therapist) Nurse Communication: Mobility status;Need for lift equipment(with sacral sore, consider only in chair 1-2 hours) PT Visit Diagnosis: Other abnormalities of gait and mobility (R26.89);Muscle weakness (generalized) (M62.81)     Time: 3329-5188 PT Time Calculation (min) (ACUTE ONLY): 46 min  Charges:  $Therapeutic Exercise: 8-22 mins $Therapeutic Activity: 23-37 mins                      Brady [name], PT Pager [phone]    Brady [name] 11/09/2019, 4:21 PM

## 2019-11-10 ENCOUNTER — Inpatient Hospital Stay (HOSPITAL_COMMUNITY): Payer: Medicare Other

## 2019-11-10 LAB — BASIC METABOLIC PANEL
Anion gap: 11 (ref 5–15)
BUN: 19 mg/dL (ref 8–23)
CO2: 31 mmol/L (ref 22–32)
Calcium: 9 mg/dL (ref 8.9–10.3)
Chloride: 93 mmol/L — ABNORMAL LOW (ref 98–111)
Creatinine, Ser: 0.31 mg/dL — ABNORMAL LOW (ref 0.61–1.24)
GFR calc Af Amer: >60 mL/min (ref >60)
GFR calc non Af Amer: >60 mL/min (ref >60)
Glucose, Bld: 106 mg/dL — ABNORMAL HIGH (ref 70–99)
Potassium: 4.6 mmol/L (ref 3.5–5.1)
Sodium: 135 mmol/L (ref 135–145)

## 2019-11-10 LAB — BODY FLUID CELL COUNT WITH DIFFERENTIAL
Eos, Fluid: 0 %
Lymphs, Fluid: 5 %
Monocyte-Macrophage-Serous Fluid: 3 % — ABNORMAL LOW (ref 50–90)
Neutrophil Count, Fluid: 92 % — ABNORMAL HIGH (ref 0–25)
Total Nucleated Cell Count, Fluid: 4400 cu mm — ABNORMAL HIGH (ref 0–1000)

## 2019-11-10 LAB — CBC
HCT: 34.7 % — ABNORMAL LOW (ref 39.0–52.0)
Hemoglobin: 11.2 g/dL — ABNORMAL LOW (ref 13.0–17.0)
MCH: 31.2 pg (ref 26.0–34.0)
MCHC: 32.3 g/dL (ref 30.0–36.0)
MCV: 96.7 fL (ref 80.0–100.0)
Platelets: 439 10*3/uL — ABNORMAL HIGH (ref 150–400)
RBC: 3.59 MIL/uL — ABNORMAL LOW (ref 4.22–5.81)
RDW: 15 % (ref 11.5–15.5)
WBC: 11.4 10*3/uL — ABNORMAL HIGH (ref 4.0–10.5)
nRBC: 0 % (ref 0.0–0.2)

## 2019-11-10 LAB — GLUCOSE, CAPILLARY
Glucose-Capillary: 99 mg/dL (ref 70–99)
Glucose-Capillary: 89 mg/dL (ref 70–99)

## 2019-11-10 LAB — MAGNESIUM: Magnesium: 1.8 mg/dL (ref 1.7–2.4)

## 2019-11-10 MED ORDER — MIDAZOLAM HCL 2 MG/2ML IJ SOLN
INTRAMUSCULAR | Status: AC
  Administered 2019-11-10: 2 mg via INTRAVENOUS
  Filled 2019-11-10: qty 2

## 2019-11-10 MED ORDER — ETOMIDATE 2 MG/ML IV SOLN
INTRAVENOUS | Status: AC
  Administered 2019-11-10: 20 mg via INTRAVENOUS
  Filled 2019-11-10: qty 10

## 2019-11-10 MED ORDER — MIDAZOLAM HCL 2 MG/2ML IJ SOLN: 2.0000 mg | Freq: Once | INTRAMUSCULAR | Status: AC

## 2019-11-10 MED ORDER — ETOMIDATE 2 MG/ML IV SOLN: 20.0000 mg | Freq: Once | INTRAVENOUS | Status: AC

## 2019-11-10 MED ORDER — FENTANYL CITRATE (PF) 100 MCG/2ML IJ SOLN
INTRAMUSCULAR | Status: AC
  Administered 2019-11-10: 50 ug via INTRAVENOUS
  Filled 2019-11-10: qty 2

## 2019-11-10 MED ORDER — FENTANYL CITRATE (PF) 100 MCG/2ML IJ SOLN: 50.0000 ug | Freq: Once | INTRAMUSCULAR | Status: AC

## 2019-11-10 NOTE — Progress Notes (Signed)
Chest pt not done this round due to bronch done with MD.

## 2019-11-10 NOTE — Progress Notes (Signed)
  Subjective:  Brady [name] was examined and evaluated at bedside this AM. He mentions that he feels better but he noticed increasing sputum production requiring more frequent suctioning. Discussed findings of morning X-ray and his current antibiotic therapy. Brady [name] expressed understanding.  Objective:   Vital Signs (last 24 hours): Vitals:   11/10/19 0500 11/10/19 0600 11/10/19 0700 11/10/19 0722  BP: 117/70 113/75 (!) 143/80   Pulse: 79 84 88   Resp: 20 (!) 21 (!) 25   Temp:      TempSrc:      SpO2: 93% 94% 92% 98%  Weight:      Height:        Physical Exam: General Alert and answers questions appropriately, no acute distress  Cardiac Regular rate and rhythm, no murmurs, rubs, or gallops  Pulmonary Clear to auscultation bilaterally without wheezes, rhonchi, or rales   CBC Latest Ref Rng & Units 11/10/2019 11/09/2019 11/08/2019  WBC 4.0 - 10.5 K/uL 11.4(H) 10.9(H) 10.8(H)  Hemoglobin 13.0 - 17.0 g/dL 11.2(L) 10.9(L) 10.4(L)  Hematocrit 39.0 - 52.0 % 34.7(L) 34.6(L) 32.8(L)  Platelets 150 - 400 K/uL 439(H) 416(H) 397   BMP Latest Ref Rng & Units 11/10/2019 11/08/2019 11/07/2019  Glucose 70 - 99 mg/dL 623(J) 90 628(B)  BUN 8 - 23 mg/dL 19 17 18   Creatinine 0.61 - 1.24 mg/dL ) 1.51(V) <6.16(W)  Sodium 135 - 145 mmol/L 135 136 135  Potassium 3.5 - 5.1 mmol/L 4.6 4.1 3.6  Chloride 98 - 111 mmol/L 93(L) 96(L) 95(L)  CO2 22 - 32 mmol/L 31 32 31  Calcium 8.9 - 10.3 mg/dL 9.0 <7.37(T) 0.6(Y)   Assessment/Plan:   Active Problems:   Hypotension   Lactic acidosis   Small bowel obstruction (HCC)   VAP (ventilator-associated pneumonia) (HCC)   Acute on chronic respiratory failure (HCC)   S/P bronchoscopy   Pressure injury of sacral region, stage 1  Patient is a 64 year old male with past medical history significant for ventilatory dependent respiratory failure, hyperlipidemia, atypical depression, COPD, chronic lower back pain admitted on 10/21/2019 from [facility] with SBO and  recurrence of Serratia pneumonia.  # Chronic hypoxic respiratory failure Ventilatory dependent, awaiting placement for LTAC, bronchoscopy on 11/04/2019 revealed 150 cc mucus which was suctioned.  Patient has remained afebrile (Tmax 99.7), WBC stable with mild elevation: 10.8 -> 10.8 -> 11.4.  Repeat chest x-ray performed today demonstrates worsening of right-sided atelectasis/consolidation. Sputum analysis from bronchoscopy demonstrated few serratia marcescens. Given patients continued inability to wean off vent, patient placed on Bactrim per PCCM *Day 2/5 of Bactrim per PCCM *Appreciate PCCMs assistance with patient *Discharge once LTAC placement acquired  # SBO Present on admission, treated with enema, laxatives, resolved *Continue tube feeds *Continue with as needed MiraLAX, Senokot  # Paroxysmal A.Fib Currently in sinus. CHAD-VASC2 score of 1 - C/w telemetry  PT/OT:Following, we appreciate their assistance Diet:Tube feeds DVT Ppx:Lovenox65mg  daily Admit Status:Inpatient Dispo: Anticipated dischargepending placement   01/04/2020, MD 11/10/2019, 7:34 AM

## 2019-11-10 NOTE — Procedures (Signed)
Bronchoscopy Procedure Note Brady [name] [id number] 10/12/1955  Procedure: Bronchoscopy Indications: Diagnostic evaluation of the airways and Obtain specimens for culture and/or other diagnostic studies  Procedure Details Consent: Risks of procedure as well as the alternatives and risks of each were explained to the (patient/caregiver).  Consent for procedure obtained. Time Out: Verified patient identification, verified procedure, site/side was marked, verified correct patient position, special equipment/implants available, medications/allergies/relevent history reviewed, required imaging and test results available.  Performed  In preparation for procedure, patient was given 100% FiO2 and bronchoscope lubricated. Sedation: Benzodiazepines and Etomidate  Airway entered and the following bronchi were examined: RUL, RML, RLL, LUL, LLL and Bronchi.   Thick purulent mucus found occluding the right mainstem bronchus down into the lower lobes.  Airways were cleared with serial aliquots and washings. Lavage performed in the right lower lobes. Airway inspection at the end showed clear airways bilaterally.  Evaluation Hemodynamic Status: BP stable throughout; O2 sats: stable throughout Patient's Current Condition: stable Specimens:  Sent purulent fluid Complications: No apparent complications Patient did tolerate procedure well.  Chilton Greathouse MD [facility] Please see [email or web address] for pager details.  11/10/2019, 4:35 PM

## 2019-11-10 NOTE — Progress Notes (Signed)
NAME:  Brady [name], MRN:  [id number], DOB:  04/23/1956, LOS: 21 ADMISSION DATE:  10/30/2019, CONSULTATION DATE:  11/10/19 REFERRING MD:  Gilford Rile, CHIEF COMPLAINT:   Afib, hypotension  Brief History   63 year old male with past medical history of Pseudomonas/Serratia pneumonia and chronic hypercarbic respiratory failure requiring trach and vent dependent who was sent from [facility] for atrial fibrillation and hypotension.  This improved with IV fluids.  Imaging shows right lower lobe consolidation.  Admit to progressive care with PCCM consult for vent management  Past Medical History  Spinal stenosis, hepatitis C, hypertension, chronic respiratory failure trach and vent dependent  Significant Hospital Events   3/19-admit to progressive care 4/1-  A. fib RVR, transferred to 4 N.  Consults:  PCCM General surgery  Procedures:   Bronchoscopy 3/20 >> therapeutic aspiration of thick, purulent secretions and 27mm x 33mm chipped tooth suctioned from RLL  Bronchoscopy 4/3 >> therapeutic aspiration of secretions right greater than left  Significant Diagnostic Tests:  3/19 CT abdomen pelvis>>Mildly dilated fluid-filled loops of proximal and mid small bowel with transition to decompressed distal and terminal ileum suggestive of low-grade bowel obstruction 3/19 CT chest>>Complete collapse of the RLL secondary to aspirated debris within the RLL bronchus. Consolidation in the posterior segments of the RUL concerning for aspiration.Cholelithiasis with questionable gallbladder wall thickening. 3/19-3/20: significant stool burden on CT and KUB (personal interpretation) 3/23> Echo: LVEF 55-60% without RWMA and with trivial mitral valve regurgitation  Micro Data:  3/19 SARS-Covid-2>> negative 3/19 urine culture>> E. Faecalis  3/19 blood cultures x2>> negative x5 days 3/20 respiratory culture >> Serratia marcescens, R-cefazolin 3/29 blood cultures x2 >>ng 4/5 Trach asp >> few  serrratia  Antimicrobials:  Cefepime 3/19>>3/26 Flagyl 3/19 Vancomycin 3/19>3/21 Bactrim 3/26>>3/28  Interim history/subjective:   Continues on PSV. CXR showing worse opacities on right.  RT reports thick secretions today with high peak pressures  Objective   Blood pressure 132/75, pulse 91, temperature 98.8 F (37.1 C), temperature source Oral, resp. rate (!) 23, height 5\' 8"  (1.727 m), weight 132 kg, SpO2 98 %.    Vent Mode: PRVC FiO2 (%):  [60 %-100 %] 100 % Set Rate:  [12 bmp] 12 bmp Vt Set:  [500 mL] 500 mL PEEP:  [5 cmH20] 5 cmH20 Plateau Pressure:  [18 cmH20-20 cmH20] 20 cmH20   Intake/Output Summary (Last 24 hours) at 11/10/2019 1557 Last data filed at 11/10/2019 1400 Gross per 24 hour  Intake 2315 ml  Output 2195 ml  Net 120 ml   Filed Weights   11/05/19 0500 11/07/19 0500 11/08/19 0500  Weight: 135 kg 134 kg 132 kg   Blood pressure 135/75, pulse 91, temperature 98.8 F (37.1 C), temperature source Oral, resp. rate (!) 23, height 5\' 8"  (1.727 m), weight 132 kg, SpO2 98 %. Gen:      Chronically ill appearing HEENT:  EOMI, sclera anicteric Neck:     No masses; no thyromegaly, trach Lungs:    Clear to auscultation bilaterally; normal respiratory effort CV:         Regular rate and rhythm; no murmurs Abd:      + bowel sounds; soft, non-tender; no palpable masses, no distension Ext:    No edema; adequate peripheral perfusion Skin:      Warm and dry; no rash Neuro: Awake, responsive  CXR 4/9-worsening dense right lower lobe consolidation.   Resolved Hospital Problem list   SBO Assessment & Plan:  Right lower lobe collapse, chronic trach and vent dependent respiratory failure  Serratia PNA, history of Pseudomonas pneumonia S/p bronchoscopy x2 Was tolerating trach collar trials until 3/29.   Continues to have high peak airway pressures with thick mucus, worsening chest x-ray Plan for repeat bronchoscopy today. Bedside ultrasound does not reveal any fluid on the  right Continue chest PT and hypertonic saline neb - pt refusing at times  Continue antibiotics.  Follow cultures.  Ileus secondary to severe constipation with overflow diarrhea  Improved Continue tube feeds, as needed bowel regimen.  Transient Atrial fibrillation/RVR >> in NSR now Off amiodarone Telemetry monitoring  Chronic hypotension Unclear etiology; however may be secondary to ongoing GI losses.  Continue midodrine 5mg  tid  The patient is critically ill with multiple organ system failure and requires high complexity decision making for assessment and support, frequent evaluation and titration of therapies, advanced monitoring, review of radiographic studies and interpretation of complex data.   Critical Care Time devoted to patient care services, exclusive of separately billable procedures, described in this note is 35 minutes.   MD [facility] Please see [email or web address] for pager details.  11/10/2019, 4:32 PM

## 2019-11-10 NOTE — TOC Progression Note (Signed)
Transition of Care [facility]) - Progression Note    Patient Details  Name: Brady [name] MRN: [id number] Date of Birth: [birth year]  [facility] CM/SW Contact  Mearl Latin, LCSW Phone Number: 11/10/2019, 9:10 AM  Clinical Narrative:    LVM with [name] SW at [facility] requesting callback to verify how many Medicare days patient has left and if [facility] had started [facility] application. [name] [facility] [phone]        Expected Discharge Plan and Services                                                 Social Determinants of Health (SDOH) Interventions    Readmission Risk Interventions No flowsheet data found.

## 2019-11-11 LAB — BASIC METABOLIC PANEL
Anion gap: 10 (ref 5–15)
BUN: 20 mg/dL (ref 8–23)
CO2: 32 mmol/L (ref 22–32)
Calcium: 8.9 mg/dL (ref 8.9–10.3)
Chloride: 93 mmol/L — ABNORMAL LOW (ref 98–111)
Creatinine, Ser: 0.36 mg/dL — ABNORMAL LOW (ref 0.61–1.24)
GFR calc Af Amer: >60 mL/min (ref >60)
GFR calc non Af Amer: >60 mL/min (ref >60)
Glucose, Bld: 94 mg/dL (ref 70–99)
Potassium: 5.1 mmol/L (ref 3.5–5.1)
Sodium: 135 mmol/L (ref 135–145)

## 2019-11-11 LAB — CBC
HCT: 33.5 % — ABNORMAL LOW (ref 39.0–52.0)
Hemoglobin: 10.7 g/dL — ABNORMAL LOW (ref 13.0–17.0)
MCH: 31 pg (ref 26.0–34.0)
MCHC: 31.9 g/dL (ref 30.0–36.0)
MCV: 97.1 fL (ref 80.0–100.0)
Platelets: 438 10*3/uL — ABNORMAL HIGH (ref 150–400)
RBC: 3.45 MIL/uL — ABNORMAL LOW (ref 4.22–5.81)
RDW: 15 % (ref 11.5–15.5)
WBC: 10.7 10*3/uL — ABNORMAL HIGH (ref 4.0–10.5)
nRBC: 0 % (ref 0.0–0.2)

## 2019-11-11 LAB — ACID FAST SMEAR (AFB, MYCOBACTERIA): Acid Fast Smear: NEGATIVE

## 2019-11-11 LAB — GLUCOSE, CAPILLARY
Glucose-Capillary: 94 mg/dL (ref 70–99)
Glucose-Capillary: 103 mg/dL — ABNORMAL HIGH (ref 70–99)
Glucose-Capillary: 88 mg/dL (ref 70–99)

## 2019-11-11 MED ORDER — FENTANYL CITRATE (PF) 100 MCG/2ML IJ SOLN: 100.0000 ug | Freq: Once | INTRAMUSCULAR | Status: AC

## 2019-11-11 MED ORDER — SODIUM CHLORIDE 3 % IN NEBU
4.0000 mL | INHALATION_SOLUTION | Freq: Two times a day (BID) | RESPIRATORY_TRACT | Status: AC
  Administered 2019-11-12 – 2019-11-14 (×5): 4 mL via RESPIRATORY_TRACT
  Filled 2019-11-11 (×6): qty 4

## 2019-11-11 MED ORDER — MIDAZOLAM HCL 2 MG/2ML IJ SOLN
INTRAMUSCULAR | Status: AC
  Administered 2019-11-11: 08:00:00 1 mg via INTRAVENOUS
  Filled 2019-11-11: qty 2

## 2019-11-11 MED ORDER — MIDAZOLAM HCL 2 MG/2ML IJ SOLN: 2.0000 mg | Freq: Once | INTRAMUSCULAR | Status: AC

## 2019-11-11 MED ORDER — FENTANYL CITRATE (PF) 100 MCG/2ML IJ SOLN
INTRAMUSCULAR | Status: AC
  Administered 2019-11-11: 08:00:00 100 ug via INTRAVENOUS
  Filled 2019-11-11: qty 2

## 2019-11-11 NOTE — Procedures (Signed)
Bedside Bronchoscopy Procedure Note Xyler Terpening [id number] Dec 22, 1955  Procedure: Bronchoscopy Indications: Remove secretions  Procedure Details: ET Tube Size: ET Tube secured at lip (cm): Bite block in place: No In preparation for procedure, Patient hyper-oxygenated with 100 % FiO2 Airway entered and the following bronchi were examined: RUL, RML, RLL, LUL, LLL and Bronchi.   Patient placed back on 100% FiO2 at conclusion of procedure.    Evaluation BP 118/63   Pulse 84   Temp 98.9 F (37.2 C) (Oral)   Resp 16   Ht 5\' 8"  (1.727 m)   Wt 136 kg   SpO2 97%   BMI 45.59 kg/m  Breath Sounds:Rhonch O2 sats: stable throughout Patient's Current Condition: stable Specimens:  None Complications: No apparent complications Patient did tolerate procedure well.   [facility] 11/11/2019, 8:02 AM

## 2019-11-11 NOTE — Progress Notes (Signed)
NAME:  Brady [name], MRN:  [id number], DOB:  22-Dec-1955, LOS: 22 ADMISSION DATE:  10/10/2019, CONSULTATION DATE:  11/11/19 REFERRING MD:  Sande Brothers, CHIEF COMPLAINT:   Afib, hypotension  Brief History   64 year old male with past medical history of Pseudomonas/Serratia pneumonia and chronic hypercarbic respiratory failure requiring trach and vent dependent who was sent from [facility] for atrial fibrillation and hypotension.  This improved with IV fluids.  Imaging shows right lower lobe consolidation.  Admit to progressive care with PCCM consult for vent management  Past Medical History  Spinal stenosis, hepatitis C, hypertension, chronic respiratory failure trach and vent dependent  Significant Hospital Events   3/19-admit to progressive care 4/1-  A. fib RVR, transferred to 4 N. 4/9 - Diagnostic and therapeutic bronchoscopy for mucous plugging 4/10 - Therapeutic bronchoscopy for mucous plurgging Consults:  PCCM General surgery  Procedures:   Bronchoscopy 3/20 >> therapeutic aspiration of thick, purulent secretions and 88mm x 91mm chipped tooth suctioned from RLL  Bronchoscopy 4/3 >> therapeutic aspiration of secretions right greater than left  4/9 - Diagnostic and therapeutic bronchoscopy for mucous plugging  4/10 - Therapeutic bronchoscopy for mucous plurgging Significant Diagnostic Tests:  3/19 CT abdomen pelvis>>Mildly dilated fluid-filled loops of proximal and mid small bowel with transition to decompressed distal and terminal ileum suggestive of low-grade bowel obstruction 3/19 CT chest>>Complete collapse of the RLL secondary to aspirated debris within the RLL bronchus. Consolidation in the posterior segments of the RUL concerning for aspiration.Cholelithiasis with questionable gallbladder wall thickening. 3/19-3/20: significant stool burden on CT and KUB (personal interpretation) 3/23> Echo: LVEF 55-60% without RWMA and with trivial mitral valve regurgitation  Micro Data:    3/19 SARS-Covid-2>> negative 3/19 urine culture>> E. Faecalis  3/19 blood cultures x2>> negative x5 days 3/20 respiratory culture >> Serratia marcescens, R-cefazolin 3/29 blood cultures x2 >>ng 4/5 Trach asp >> few serrratia  Antimicrobials:  Cefepime 3/19>>3/26 Flagyl 3/19 Vancomycin 3/19>3/21 Bactrim 3/26>>3/28  Interim history/subjective:  Desatted this morning with persistent hypoxemia mid 70s with thick secretions on suction. RT and RN at bedside. Bedside bronch performed for therapeutic aspiration of thick mucopurulent secretions.  Objective   Blood pressure 118/63, pulse 84, temperature 98.9 F (37.2 C), temperature source Oral, resp. rate 16, height 5\' 8"  (1.727 m), weight 136 kg, SpO2 97 %.    Vent Mode: PRVC FiO2 (%):  [60 %-100 %] 100 % Set Rate:  [12 bmp] 12 bmp Vt Set:  [500 mL] 500 mL PEEP:  [5 cmH20] 5 cmH20 Plateau Pressure:  [18 cmH20-20 cmH20] 18 cmH20   Intake/Output Summary (Last 24 hours) at 11/11/2019 0800 Last data filed at 11/11/2019 0700 Gross per 24 hour  Intake 1906 ml  Output 2640 ml  Net -734 ml   Filed Weights   11/07/19 0500 11/08/19 0500 11/11/19 0500  Weight: 134 kg 132 kg 136 kg   Blood pressure 91/62, pulse 80, temperature 98.9 F (37.2 C), temperature source Oral, resp. rate 13, height 5\' 8"  (1.727 m), weight 136 kg, SpO2 100 %.  Physical Exam: General: Chronically ill-appearing, no acute distress HENT: Star, AT, OP clear, MMM Eyes: EOMI, no scleral icterus Neck: Cuffed trach in place, c/d/i Respiratory: Clear to auscultation bilaterally.  No crackles, wheezing or rales Cardiovascular: RRR, -M/R/G, no JVD Extremities:-Edema,-tenderness Neuro: AAO x4, CNII-XII grossly intact Psych: Normal mood, normal affect  Resolved Hospital Problem list   SBO Assessment & Plan:  Right lower lobe collapse, chronic trach and vent dependent respiratory failure Serratia PNA, history of Pseudomonas  pneumonia S/p bronchoscopy multiple bronchs  including 4/9, 4/10 for mucous plugging Was tolerating trach collar trials until 3/29.   Vent settings adjusted: Increased PEEP and FIO2. Wean FIO2 first. Ok to wean PEEP to 8 this afternoon Aggressive pulmonary toilet, continue chest PT and hypertonic saline neb - pt refusing at times  Continue antibiotics.  Follow 4/9 final cultures  Transient Atrial fibrillation/RVR >> in NSR now Off amiodarone Telemetry monitoring  Chronic hypotension Unclear etiology; however may be secondary to ongoing GI losses.  Continue midodrine 5mg  tid  The patient is critically ill with multiple organ systems failure and requires high complexity decision making for assessment and support, frequent evaluation and titration of therapies, application of advanced monitoring technologies and extensive interpretation of multiple databases.   Critical Care Time devoted to patient care services described in this note is 40 Minutes.   Rodman Pickle, M.D. [facility] 11/11/2019 8:00 AM   Please see Amion for pager number to reach on-call Pulmonary and Critical Care Team.

## 2019-11-11 NOTE — Progress Notes (Signed)
  Subjective:  Patient seen at bedside. Patient appears to be in increased distress. Patient counseled on plan of care.  Objective:   Vital Signs (last 24 hours): Vitals:   11/11/19 0406 11/11/19 0500 11/11/19 0600 11/11/19 0700  BP:  (!) 96/51 119/67 (!) 96/56  Pulse:  69 71 71  Resp: 17 (!) 28 15 16   Temp:      TempSrc:      SpO2: 92% 97% 96% 97%  Weight:  136 kg    Height:       Physical Exam: General Alert and responding appropriately, appears to be in moderate distress  Cardiac Regular rate and rhythm, no murmurs, rubs, or gallops  Pulmonary Bilateral air movement, rhonchorous breath sounds  Abdominal Soft, non-tender, without distention   CBC Latest Ref Rng & Units 11/11/2019 11/10/2019 11/09/2019  WBC 4.0 - 10.5 K/uL 10.7(H) 11.4(H) 10.9(H)  Hemoglobin 13.0 - 17.0 g/dL 10.7(L) 11.2(L) 10.9(L)  Hematocrit 39.0 - 52.0 % 33.5(L) 34.7(L) 34.6(L)  Platelets 150 - 400 K/uL 438(H) 439(H) 416(H)   BMP Latest Ref Rng & Units 11/11/2019 11/10/2019 11/08/2019  Glucose 70 - 99 mg/dL 94 01/08/2020) 90  BUN 8 - 23 mg/dL 20 19 17   Creatinine 0.61 - 1.24 mg/dL 381(M) ) 4.03(F)  Sodium 135 - 145 mmol/L 135 135 136  Potassium 3.5 - 5.1 mmol/L 5.1 4.6 4.1  Chloride 98 - 111 mmol/L 93(L) 93(L) 96(L)  CO2 22 - 32 mmol/L 32 31 32  Calcium 8.9 - 10.3 mg/dL 8.9 9.0 5.43(K)     Assessment/Plan:   Active Problems:   Hypotension   Lactic acidosis   Small bowel obstruction (HCC)   VAP (ventilator-associated pneumonia) (HCC)   Acute on chronic respiratory failure (HCC)   S/P bronchoscopy   Pressure injury of sacral region, stage 1  Patient is a 64 year old male with past medical history significant for ventilatory dependent respiratory failure, hyperlipidemia, atypical depression, COPD, chronic lower back pain admitted on 2019-11-08 from [facility] with SBO and recurrence of Serratia pneumonia.  # Chronic hypoxic respiratory failure Ventilatory dependent, awaiting placement for LTAC,  bronchoscopy on 11/04/2019 revealed 150 cc mucus which was suctioned.  Patient has remained afebrile (Tmax 99.7), WBC stable with mild elevation: 10.8 -> 10.8 -> 11.4.  Repeat chest x-ray  demonstrated worsening of right-sided atelectasis/consolidation. Sputum analysis from bronchoscopy demonstrated few serratia marcescens. Given patients continued inability to wean off vent, patient placed on Bactrim per PCCM. CXR on 11/10/19 demonstrated worsening of right-sided atelectasis/consolidation and repeat bronchoscopy performed on 11/10/19 with removal of thick purulent mucus in the right mainstem bronchus down into lower lobes. Repeat bronchoscopy performed today, 11/11/19 with thick mucopurulent secretions removed from RLL. *Day 3 of Bactrim per PCCM *Appreciate PCCMs assistance with patient *Discharge once LTAC placement acquired  # SBO Present on admission, treated with enema, laxatives, resolved *Continue tube feeds *Continue with as needed MiraLAX, Senokot  # Paroxysmal A.Fib Currently in sinus. CHAD-VASC2 score of 1 -C/w telemetry  PT/OT:Following, we appreciate their assistance Diet:Tube feeds DVT Ppx:Lovenox65mg  daily Admit Status:Inpatient Dispo: Anticipated dischargepending clinical improvement  01/10/20, MD 11/11/2019, 7:04 AM

## 2019-11-11 NOTE — Procedures (Signed)
Bronchoscopy Procedure Note Brady [name] [id number] 05/24/1956  Procedure: Bronchoscopy Indications: Remove secretions  Procedure Details Consent: Risks of procedure as well as the alternatives and risks of each were explained to the (patient/caregiver).  Consent for procedure obtained. Time Out: Verified patient identification, verified procedure, site/side was marked, verified correct patient position, special equipment/implants available, medications/allergies/relevent history reviewed, required imaging and test results available.  Performed  In preparation for procedure, patient was given 100% FiO2 and bronchoscope lubricated. Sedation: Fentanyl 100 mcg and Versed 1 mg  Airway entered via tracheostomy and the following bronchi were examined: RUL, RML, RLL, LUL and LLL.   Procedures performed: Thick mucopurulent secretions suctioned and cleared from RLL after lavage. Airways clear on inspection prior to removal of bronchoscope. Bronchoscope removed.    Evaluation Hemodynamic Status: BP stable throughout; O2 sats: stable throughout Patient's Current Condition: stable Specimens:  None Complications: No apparent complications Patient did tolerate procedure well.   Brady [name] 11/11/2019

## 2019-11-12 DIAGNOSIS — J156 Pneumonia due to other aerobic Gram-negative bacteria: Secondary | ICD-10-CM

## 2019-11-12 LAB — GLUCOSE, CAPILLARY
Glucose-Capillary: 117 mg/dL — ABNORMAL HIGH (ref 70–99)
Glucose-Capillary: 91 mg/dL (ref 70–99)
Glucose-Capillary: 97 mg/dL (ref 70–99)

## 2019-11-12 LAB — BASIC METABOLIC PANEL
Anion gap: 10 (ref 5–15)
BUN: 24 mg/dL — ABNORMAL HIGH (ref 8–23)
CO2: 33 mmol/L — ABNORMAL HIGH (ref 22–32)
Calcium: 9.3 mg/dL (ref 8.9–10.3)
Chloride: 90 mmol/L — ABNORMAL LOW (ref 98–111)
Creatinine, Ser: 0.44 mg/dL — ABNORMAL LOW (ref 0.61–1.24)
GFR calc Af Amer: >60 mL/min (ref >60)
GFR calc non Af Amer: >60 mL/min (ref >60)
Glucose, Bld: 101 mg/dL — ABNORMAL HIGH (ref 70–99)
Potassium: 5.4 mmol/L — ABNORMAL HIGH (ref 3.5–5.1)
Sodium: 133 mmol/L — ABNORMAL LOW (ref 135–145)

## 2019-11-12 LAB — CBC
HCT: 33.8 % — ABNORMAL LOW (ref 39.0–52.0)
Hemoglobin: 10.9 g/dL — ABNORMAL LOW (ref 13.0–17.0)
MCH: 31.4 pg (ref 26.0–34.0)
MCHC: 32.2 g/dL (ref 30.0–36.0)
MCV: 97.4 fL (ref 80.0–100.0)
Platelets: 514 10*3/uL — ABNORMAL HIGH (ref 150–400)
RBC: 3.47 MIL/uL — ABNORMAL LOW (ref 4.22–5.81)
RDW: 15.2 % (ref 11.5–15.5)
WBC: 12.5 10*3/uL — ABNORMAL HIGH (ref 4.0–10.5)
nRBC: 0 % (ref 0.0–0.2)

## 2019-11-12 LAB — MAGNESIUM: Magnesium: 1.8 mg/dL (ref 1.7–2.4)

## 2019-11-12 MED ORDER — SODIUM ZIRCONIUM CYCLOSILICATE 10 G PO PACK
10.0000 g | PACK | Freq: Once | ORAL | Status: DC
  Filled 2019-11-12: qty 1

## 2019-11-12 MED ORDER — SODIUM ZIRCONIUM CYCLOSILICATE 10 G PO PACK
10.0000 g | PACK | Freq: Two times a day (BID) | ORAL | Status: DC
  Administered 2019-11-12 – 2019-11-13 (×2): 10 g via ORAL
  Filled 2019-11-12 (×2): qty 1

## 2019-11-12 MED ORDER — HYDROXYZINE HCL 10 MG PO TABS
10.0000 mg | ORAL_TABLET | Freq: Three times a day (TID) | ORAL | Status: DC | PRN
  Filled 2019-11-12 (×2): qty 1

## 2019-11-12 NOTE — Progress Notes (Addendum)
  Subjective:  Patient seen at bedside. Patient states he is having more difficulty breathing. Patient counseled that he will benefit from suction, respiratory therapist present for vent management. Patient counseled on plan of care, all questions answered.  Objective:    Vital Signs (last 24 hours): Vitals:   11/12/19 0300 11/12/19 0400 11/12/19 0434 11/12/19 0500  BP: 111/67 (!) 99/55 (!) 99/55 (!) 102/57  Pulse: 82 80 88 86  Resp: 13 13 15 17   Temp:  98.1 F (36.7 C)    TempSrc:  Oral    SpO2: 98% 99% 98% 99%  Weight:    98 kg  Height:        Physical Exam: General Alert and answers questions appropriately, no acute distress  Cardiac Regular rate and rhythm, no murmurs, rubs, or gallops  Pulmonary Bilateral air movement, rhoncorous breath sounds (R>L)   CBC Latest Ref Rng & Units 11/12/2019 11/11/2019 11/10/2019  WBC 4.0 - 10.5 K/uL 12.5(H) 10.7(H) 11.4(H)  Hemoglobin 13.0 - 17.0 g/dL 10.9(L) 10.7(L) 11.2(L)  Hematocrit 39.0 - 52.0 % 33.8(L) 33.5(L) 34.7(L)  Platelets 150 - 400 K/uL 514(H) 438(H) 439(H)   BMP Latest Ref Rng & Units 11/12/2019 11/11/2019 11/10/2019  Glucose 70 - 99 mg/dL 01/10/2020) 94 342(A)  BUN 8 - 23 mg/dL 768(T) 20 19  Creatinine 0.61 - 1.24 mg/dL 15(B) 2.62(M) 3.55(H)  Sodium 135 - 145 mmol/L 133(L) 135 135  Potassium 3.5 - 5.1 mmol/L 5.4(H) 5.1 4.6  Chloride 98 - 111 mmol/L 90(L) 93(L) 93(L)  CO2 22 - 32 mmol/L 33(H) 32 31  Calcium 8.9 - 10.3 mg/dL 9.3 8.9 9.0   Magnesium: 1.8   Assessment/Plan:   Active Problems:   Hypotension   Lactic acidosis   Small bowel obstruction (HCC)   VAP (ventilator-associated pneumonia) (HCC)   Acute on chronic respiratory failure (HCC)   S/P bronchoscopy   Pressure injury of sacral region, stage 1  Patient is a 64 year old male with past medical history significant for ventilatory dependent respiratory failure, hyperlipidemia, atypical depression, COPD, chronic lower back pain admitted on 11/01/19 from [facility] with  SBO and recurrence of Serratia pneumonia.  # Chronic hypoxic respiratory failure Ventilatory dependent, awaiting placement for LTAC, patient with multiple reprieve bronchoscopies due to increased, thick mucus production.  Patient remains on Bactrim per PCCM for probable Serratia pneumonia. *Day 4 of Bactrim per PCCM *Appreciate PCCMs assistance with patient  # SBO Present on admission, treated with enema, laxatives, resolved *Continue tube feeds *Continue with as needed MiraLAX,Senokot  # Hyperkalemia: Potassium 5.4 this AM from 5.1 yesterday. Patient previously hypokalemic. Not currently on potassium supplementation.  *EKG, if without changes will monitor on AM labs  #Paroxysmal A.Fib Currently in sinus. CHAD-VASC2 score of 1 -C/w telemetry  PT/OT:Following, we appreciate their assistance Diet:Tube feeds DVT Ppx:Lovenox65mg  daily Admit Status:Inpatient Dispo: Anticipated dischargepending clinical improvement  10/22/2019, MD 11/12/2019, 6:37 AM

## 2019-11-12 NOTE — Progress Notes (Signed)
NAME:  Brady [name], MRN:  [id number], DOB:  08-Oct-1955, LOS: 23 ADMISSION DATE:  11/16/2019, CONSULTATION DATE:  11/12/19 REFERRING MD:  Gilford Rile, CHIEF COMPLAINT:   Afib, hypotension  Brief History   64 year old male with past medical history of Pseudomonas/Serratia pneumonia and chronic hypercarbic respiratory failure requiring trach and vent dependent who was sent from [facility] for atrial fibrillation and hypotension.  This improved with IV fluids.  Imaging shows right lower lobe consolidation.  Admit to progressive care with PCCM consult for vent management  Past Medical History  Spinal stenosis, hepatitis C, hypertension, chronic respiratory failure trach and vent dependent  Significant Hospital Events   3/19-admit to progressive care 4/1-  A. fib RVR, transferred to 4 N. 4/9 - Diagnostic and therapeutic bronchoscopy for mucous plugging 4/10 - Therapeutic bronchoscopy for mucous plurgging Consults:  PCCM General surgery  Procedures:   Bronchoscopy 3/20 >> therapeutic aspiration of thick, purulent secretions and 65mm x 62mm chipped tooth suctioned from RLL  Bronchoscopy 4/3 >> therapeutic aspiration of secretions right greater than left  4/9 - Diagnostic and therapeutic bronchoscopy for mucous plugging  4/10 - Therapeutic bronchoscopy for mucous plurgging Significant Diagnostic Tests:  3/19 CT abdomen pelvis>>Mildly dilated fluid-filled loops of proximal and mid small bowel with transition to decompressed distal and terminal ileum suggestive of low-grade bowel obstruction 3/19 CT chest>>Complete collapse of the RLL secondary to aspirated debris within the RLL bronchus. Consolidation in the posterior segments of the RUL concerning for aspiration.Cholelithiasis with questionable gallbladder wall thickening. 3/19-3/20: significant stool burden on CT and KUB (personal interpretation) 3/23> Echo: LVEF 55-60% without RWMA and with trivial mitral valve regurgitation  Micro Data:   3/19 SARS-Covid-2>> negative 3/19 urine culture>> E. Faecalis  3/19 blood cultures x2>> negative x5 days 3/20 respiratory culture >> Serratia marcescens, R-cefazolin 3/29 blood cultures x2 >>ng 4/5 Trach asp >> few serrratia  Antimicrobials:  Cefepime 3/19>>3/26 Flagyl 3/19 Vancomycin 3/19>3/21 Bactrim 3/26>>3/28  Interim history/subjective:  FIO2 weaned to 40%. Appears comfortable and oxygenating well.  Objective   Blood pressure (!) 115/59, pulse 79, temperature (!) 97.5 F (36.4 C), temperature source Oral, resp. rate 14, height 5\' 8"  (1.727 m), weight 98 kg, SpO2 99 %.    Vent Mode: PRVC FiO2 (%):  [40 %-70 %] 40 % Set Rate:  [12 bmp] 12 bmp Vt Set:  [500 mL] 500 mL PEEP:  [8 cmH20-10 cmH20] 8 cmH20 Plateau Pressure:  [14 cmH20-16 cmH20] 15 cmH20   Intake/Output Summary (Last 24 hours) at 11/12/2019 0936 Last data filed at 11/12/2019 0700 Gross per 24 hour  Intake 1885.6 ml  Output 2550 ml  Net -664.4 ml   Filed Weights   11/11/19 0500 11/11/19 0800 11/12/19 0500  Weight: 136 kg 45.6 kg 98 kg   Blood pressure (!) 115/59, pulse 79, temperature (!) 97.5 F (36.4 C), temperature source Oral, resp. rate 14, height 5\' 8"  (1.727 m), weight 98 kg, SpO2 99 %.  Physical Exam: General: Chronically ill-appearing, no acute distress HENT: [place], AT, OP clear, MMM Eyes: EOMI, no scleral icterus Neck: Cuffed trach in place, c/d/i Respiratory: Clear to auscultation bilaterally.  No crackles, wheezing or rales Cardiovascular: RRR, -M/R/G, no JVD Extremities:-Edema,-tenderness Neuro: Awake and alert, CNII-XII grossly intact  Resolved Hospital Problem list   SBO Assessment & Plan:  Right lower lobe collapse, chronic trach and vent dependent respiratory failure Serratia PNA, history of Pseudomonas pneumonia S/p bronchoscopy multiple bronchs including 4/9, 4/10 for mucous plugging Was tolerating trach collar trials until 3/29.  Vent settings adjusted: Decreased PEEP to 8  Aggressive pulmonary toilet, continue chest PT and hypertonic saline neb - pt refusing at times  Continue antibiotics for Serratia pneumonia  Transient Atrial fibrillation/RVR >> in NSR now Off amiodarone Telemetry monitoring  Chronic hypotension Unclear etiology; however may be secondary to ongoing GI losses.  Continue midodrine 5mg  tid  The patient is critically ill with multiple organ systems failure and requires high complexity decision making for assessment and support, frequent evaluation and titration of therapies, application of advanced monitoring technologies and extensive interpretation of multiple databases.   Critical Care Time devoted to patient care services described in this note is 31 Minutes.   , M.D. [facility] Pulmonary/Critical Care Medicine 11/12/2019 9:36 AM   Please see Amion for pager number to reach on-call Pulmonary and Critical Care Team.

## 2019-11-13 LAB — BASIC METABOLIC PANEL
Anion gap: 9 (ref 5–15)
BUN: 25 mg/dL — ABNORMAL HIGH (ref 8–23)
CO2: 34 mmol/L — ABNORMAL HIGH (ref 22–32)
Calcium: 9.2 mg/dL (ref 8.9–10.3)
Chloride: 88 mmol/L — ABNORMAL LOW (ref 98–111)
Creatinine, Ser: 0.45 mg/dL — ABNORMAL LOW (ref 0.61–1.24)
GFR calc Af Amer: >60 mL/min (ref >60)
GFR calc non Af Amer: >60 mL/min (ref >60)
Glucose, Bld: 115 mg/dL — ABNORMAL HIGH (ref 70–99)
Potassium: 4.8 mmol/L (ref 3.5–5.1)
Sodium: 131 mmol/L — ABNORMAL LOW (ref 135–145)

## 2019-11-13 LAB — CBC
HCT: 33.4 % — ABNORMAL LOW (ref 39.0–52.0)
Hemoglobin: 10.5 g/dL — ABNORMAL LOW (ref 13.0–17.0)
MCH: 30.4 pg (ref 26.0–34.0)
MCHC: 31.4 g/dL (ref 30.0–36.0)
MCV: 96.8 fL (ref 80.0–100.0)
Platelets: 451 10*3/uL — ABNORMAL HIGH (ref 150–400)
RBC: 3.45 MIL/uL — ABNORMAL LOW (ref 4.22–5.81)
RDW: 15.1 % (ref 11.5–15.5)
WBC: 10.2 10*3/uL (ref 4.0–10.5)
nRBC: 0 % (ref 0.0–0.2)

## 2019-11-13 LAB — GLUCOSE, CAPILLARY
Glucose-Capillary: 100 mg/dL — ABNORMAL HIGH (ref 70–99)
Glucose-Capillary: 89 mg/dL (ref 70–99)

## 2019-11-13 LAB — MAGNESIUM: Magnesium: 1.7 mg/dL (ref 1.7–2.4)

## 2019-11-13 LAB — CULTURE, RESPIRATORY W GRAM STAIN

## 2019-11-13 MED ORDER — ENOXAPARIN SODIUM 60 MG/0.6ML ~~LOC~~ SOLN
50.0000 mg | SUBCUTANEOUS | Status: DC
  Administered 2019-11-13 – 2019-11-17 (×5): 50 mg via SUBCUTANEOUS
  Filled 2019-11-13 (×6): qty 0.5

## 2019-11-13 MED ORDER — SODIUM ZIRCONIUM CYCLOSILICATE 10 G PO PACK
10.0000 g | PACK | Freq: Two times a day (BID) | ORAL | Status: AC
  Administered 2019-11-13: 10 g via ORAL
  Filled 2019-11-13: qty 1

## 2019-11-13 MED ORDER — IBUPROFEN 100 MG/5ML PO SUSP
400.0000 mg | Freq: Four times a day (QID) | ORAL | Status: DC | PRN
  Administered 2019-11-13 – 2019-11-16 (×3): 400 mg
  Filled 2019-11-13 (×3): qty 20

## 2019-11-13 MED ORDER — LIDOCAINE 5 % EX PTCH
1.0000 | MEDICATED_PATCH | Freq: Every day | CUTANEOUS | Status: DC | PRN
  Administered 2019-11-13 – 2019-11-18 (×3): 1 via TRANSDERMAL
  Filled 2019-11-13 (×4): qty 1

## 2019-11-13 MED ORDER — FREE WATER
200.0000 mL | Freq: Three times a day (TID) | Status: DC
  Administered 2019-11-13 – 2019-11-14 (×4): 200 mL

## 2019-11-13 NOTE — Progress Notes (Signed)
Occupational Therapy Treatment Patient Details Name: Brady [name] MRN: [id number] DOB: 12-09-1955 Today's Date: 11/13/2019    History of present illness Pt is a 64 yo male admitted from [facility] 10/28/2019 due to ileus/SBO. Pt admitted 08/28/19 to [facility] hospital due to pneumonia with hypoxia; requred ventilator with trach PMHx; ACDF 07/19/2018 for myelopathy, Lt foot drop peroneal nerve injury, pt reports 12 spincal surgeries, Spinal stenosis, hepatitis C, hypertension, chronic respiratory failure trach and vent dependent.   OT comments  Pt continues to present with decreased activity tolerance, strength, ROM, and occupational performance and participation. Pt presenting with significant RUE pain limiting his ROM and functional use of RUE. Pt with limited R shoulder ROM and presenting with increased scapular abduction. Pt reporting pain radiating down to his right hand. Facilitating gentle PROM of RUE within tolerance. Performing PROM of BLEs (L>R painful). Pt applying lotion to hands with Min cues at bed level. Applying Prevalon boots for external rotation. Continue to recommend dc to post-acute rehab and will continue to follow acutely as admitted.    Follow Up Recommendations  SNF;LTACH    Equipment Recommendations  Other (comment)(TBD)    Recommendations for Other Services      Precautions / Restrictions Precautions Precautions: Fall;Other (comment) Precaution Comments: trach, vent Restrictions Weight Bearing Restrictions: No       Mobility Bed Mobility Overal bed mobility: Needs Assistance             General bed mobility comments: Max A for repositioning towards HOB and then positioning bed in chair position  Transfers                      Balance                                           ADL either performed or assessed with clinical judgement   ADL Overall ADL's : Needs assistance/impaired     Grooming: Minimal assistance;Bed  level(Applying lotion) Grooming Details (indicate cue type and reason): Bed in chair position. Pt mainly using LUE to manage and manipulate lotion bottle. Pt holding lotion bottle in R hand and then twisting off top with L hand. Pt then pouring lotion into left hand with cues that this would be more effects since he has decreased ROM at RUE for then applying lotion. Pt applying lotion to bil hands moving both UEs (limiting ROM of RUE grossly).      Lower Body Bathing: Total assistance;Bed level Lower Body Bathing Details (indicate cue type and reason): Total A for applying lotion to distal LE and feet                       General ADL Comments: Focused session on BUE/BLE ROM, trunk control, and cervical ROM.      Vision   Vision Assessment?: No apparent visual deficits   Perception     Praxis      Cognition Arousal/Alertness: Awake/alert Behavior During Therapy: WFL for tasks assessed/performed Overall Cognitive Status: Difficult to assess                                 General Comments: Pt following commands and willing to participate in therapy. Pt mouthing words and demonstrating increased problem solving and slow down and change "mouthing" technique when  his message is not being understood.         Exercises Exercises: Other exercises;General Upper Extremity;General Lower Extremity General Exercises - Upper Extremity Shoulder Flexion: AROM;AAROM;Both;10 reps(increased assist on RUE) Shoulder Extension: AROM;AAROM;Right;10 reps;Supine Elbow Flexion: AROM;AAROM;10 reps;Right;Left;Supine Elbow Extension: AROM;AAROM;Right;Left;10 reps;Supine Digit Composite Flexion: AROM;5 reps;Both;Supine General Exercises - Lower Extremity Ankle Circles/Pumps: PROM;Left;Right;10 reps;Supine Quad Sets: (in recliner, no activation of quads palpable) Hip Flexion/Marching: AAROM;Both;10 reps;Supine Other Exercises Other Exercises: BLE internal rotation; 5 reps;  supine Other Exercises: Scapular abduction/adduction Other Exercises: cervical ROM   Shoulder Instructions       General Comments VSS throughout on vent via trach collar.    Pertinent Vitals/ Pain       Pain Assessment: Faces Faces Pain Scale: Hurts whole lot Pain Location: RUE Pain Descriptors / Indicators: Discomfort;Grimacing;Constant;Moaning  Home Living                                          Prior Functioning/Environment              Frequency  Min 2X/week        Progress Toward Goals  OT Goals(current goals can now be found in the care plan section)  Progress towards OT goals: Progressing toward goals  Acute Rehab OT Goals Patient Stated Goal: agrees to OOB to recliner OT Goal Formulation: With patient Time For Goal Achievement: 11/22/19 Potential to Achieve Goals: Good ADL Goals Pt Will Perform Grooming: with min guard assist;sitting Pt Will Perform Upper Body Dressing: with min assist;sitting Pt/caregiver will Perform Home Exercise Program: Increased strength;Right Upper extremity;Left upper extremity;With Supervision Additional ADL Goal #1: Pt will increase to modA for bed mobility as precursor for OOB ADL tasks. Additional ADL Goal #2: Pt will tolerate sitting EOB x10 mins with minguardA overall in prep for OOB ADL.  Plan Discharge plan remains appropriate    Co-evaluation                 AM-PAC OT "6 Clicks" Daily Activity     Outcome Measure   Help from another person eating meals?: Total Help from another person taking care of personal grooming?: A Lot Help from another person toileting, which includes using toliet, bedpan, or urinal?: Total Help from another person bathing (including washing, rinsing, drying)?: Total Help from another person to put on and taking off regular upper body clothing?: Total Help from another person to put on and taking off regular lower body clothing?: Total 6 Click Score: 7    End of  Session Equipment Utilized During Treatment: Oxygen(Vent via trach)  OT Visit Diagnosis: Muscle weakness (generalized) (M62.81);Pain Pain - Right/Left: Left Pain - part of body: Leg   Activity Tolerance Patient tolerated treatment well   Patient Left in bed;with call bell/phone within reach;with nursing/sitter in room   Nurse Communication Mobility status        Time: 6295-2841 OT Time Calculation (min): 33 min  Charges: OT General Charges $OT Visit: 1 Visit OT Treatments $Self Care/Home Management : 8-22 mins $Therapeutic Activity: 8-22 mins  Docia Klar MSOT, OTR/L Acute Rehab Pager: [phone] Office: [phone]   Theodoro Grist Keanon Bevins 11/13/2019, 2:53 PM

## 2019-11-13 NOTE — Progress Notes (Signed)
  Subjective:  Discussed importance of respiratory therapy. Patient says he is looking forward to physical therapy and would like sit up in the chair today. We will talk to PCCM and make a plan for antibiotic course  Objective:   Vital Signs (last 24 hours): Vitals:   11/13/19 0300 11/13/19 0400 11/13/19 0500 11/13/19 0600  BP: 129/70 (!) 102/52 117/81 103/61  Pulse: 90 82 83 77  Resp: (!) 26 18 17 15   Temp:  98.1 F (36.7 C)    TempSrc:  Oral    SpO2: 99% 97% 98% 97%  Weight:      Height:        Physical Exam: General Alert and answers questions appropriately, no acute distress  Cardiac Regular rate and rhythm, no murmurs, rubs, or gallops  Pulmonary Bilateral air movement, no distress, mildly rhonchorous breath sounds, R > L   CBC Latest Ref Rng & Units 11/13/2019 11/12/2019 11/11/2019  WBC 4.0 - 10.5 K/uL 10.2 12.5(H) 10.7(H)  Hemoglobin 13.0 - 17.0 g/dL 10.5(L) 10.9(L) 10.7(L)  Hematocrit 39.0 - 52.0 % 33.4(L) 33.8(L) 33.5(L)  Platelets 150 - 400 K/uL 451(H) 514(H) 438(H)   BMP Latest Ref Rng & Units 11/13/2019 11/12/2019 11/11/2019  Glucose 70 - 99 mg/dL 01/11/2020) 237(S) 94  BUN 8 - 23 mg/dL 283(T) 51(V) 20  Creatinine 0.61 - 1.24 mg/dL 61(Y) 0.73(X) 1.06(Y)  Sodium 135 - 145 mmol/L 131(L) 133(L) 135  Potassium 3.5 - 5.1 mmol/L 4.8 5.4(H) 5.1  Chloride 98 - 111 mmol/L 88(L) 90(L) 93(L)  CO2 22 - 32 mmol/L 34(H) 33(H) 32  Calcium 8.9 - 10.3 mg/dL 9.2 9.3 8.9   Magnesium: 1.7  Assessment/Plan:   Active Problems:   Hypotension   Lactic acidosis   Small bowel obstruction (HCC)   VAP (ventilator-associated pneumonia) (HCC)   Acute on chronic respiratory failure (HCC)   S/P bronchoscopy   Pressure injury of sacral region, stage 1  Patient is a 64 year old male with past medical history significant for ventilatory dependent respiratory failure, hyperlipidemia, atypical depression, COPD, chronic lower back pain admitted on 2019/11/15 from [facility] with SBO and recurrence of  Serratia pneumonia.  # Chronic hypoxic respiratory failure Ventilatory dependent, awaiting placement for LTAC, patient with multiple repeat bronchoscopies due to increased, thick mucus production. Day 4 of bactrim today, last day per PCCM.  # SBO Present on admission, treated with enema, laxatives, resolved *Continue tube feeds *Continue with as needed MiraLAX, Senokot  # Hyperkalemia: Potassium 5.4 yesterday in AM. EKG without hyperkalemic changes. Patient started on Lokelma BID and this AM potassium of 4.8.  *Lokelma to be discontinued after today  #Paroxysmal A.Fib Currently in sinus. CHAD-VASC2 score of 1 -C/w telemetry  PT/OT:Following, we appreciate their assistance Diet:Tube feeds DVT Ppx:Lovenox65mg  daily Admit Status:Inpatient Dispo: Anticipated dischargepending clinical improvement  10/22/2019, MD 11/13/2019, 6:45 AM

## 2019-11-13 NOTE — Progress Notes (Signed)
SLP Cancellation Note  Patient Details Name: Brady [name] MRN: [id number] DOB: 07-13-1956   Cancelled treatment:       Reason Eval/Treat Not Completed: Medical issues which prohibited therapy. Attempted to see pt while he was still on TC but he has returned to vent support. Will f/u as able.    Mahala Menghini., M.A. CCC-SLP Acute Rehabilitation Services Pager [phone] Office [phone]  11/13/2019, 1:03 PM

## 2019-11-13 NOTE — Progress Notes (Signed)
Occupational Therapy Treatment Patient Details Name: Brady [name] MRN: [id number] DOB: Aug 07, 1955 Today's Date: 11/13/2019    History of present illness Pt is a 64 yo male admitted from [place] 10/24/2019 due to ileus/SBO. Pt admitted 08/28/19 to [facility] hospital due to pneumonia with hypoxia; requred ventilator with trach PMHx; ACDF 07/19/2018 for myelopathy, Lt foot drop peroneal nerve injury, pt reports 12 spincal surgeries, Spinal stenosis, hepatitis C, hypertension, chronic respiratory failure trach and vent dependent.   OT comments  Pt seen for second visit to assess and address pain at RUE. Pt continues to report significant pain at RUE; pain with touch at shoulder, scapular, and radiating down to hand. Very tender at bicep tendon and very tight throughout muscle. Attempting myofacial release at R scap, bicep, and shoulder; however, pt with significant pain. Pt tolerating myofascial pulling of LUE/LLE, scapular adduction, and cervical ROM. Continue to recommend dc to post-acute rehab and will continue to follow acutely as admitted.    Follow Up Recommendations  SNF;LTACH    Equipment Recommendations  Other (comment)(TBD)    Recommendations for Other Services      Precautions / Restrictions Precautions Precautions: Fall;Other (comment) Precaution Comments: trach, vent       Mobility Bed Mobility                  Transfers                      Balance                                           ADL either performed or assessed with clinical judgement   ADL Overall ADL's : Needs assistance/impaired                                       General ADL Comments: Focused on myofascial release and ROM to reduce pain     Vision       Perception     Praxis      Cognition Arousal/Alertness: Awake/alert Behavior During Therapy: WFL for tasks assessed/performed Overall Cognitive Status: Difficult to assess                                  General Comments: Pt following commands and willing to participate in therapy. Pt mouthing words and demonstrating increased problem solving and slow down and change "mouthing" technique when his message is not being understood.         Exercises Exercises: Other exercises Other Exercises Other Exercises: Myofascial release at RUE, shoulder, scap - pain significant and very limited to soft touch. Other Exercises: Myofascial pull ar LUE and LLE Other Exercises: Cervical rotation with support.    Shoulder Instructions       General Comments VSS throughout on vent via trach collar.    Pertinent Vitals/ Pain       Pain Assessment: Faces Faces Pain Scale: Hurts whole lot Pain Location: RUE Pain Descriptors / Indicators: Discomfort;Grimacing;Constant;Moaning Pain Intervention(s): Monitored during session;Limited activity within patient's tolerance;Repositioned  Home Living  Prior Functioning/Environment              Frequency  Min 2X/week        Progress Toward Goals  OT Goals(current goals can now be found in the care plan section)  Progress towards OT goals: Progressing toward goals  Acute Rehab OT Goals Patient Stated Goal: agrees to OOB to recliner OT Goal Formulation: With patient Time For Goal Achievement: 11/22/19 Potential to Achieve Goals: Good ADL Goals Pt Will Perform Grooming: with min guard assist;sitting Pt Will Perform Upper Body Dressing: with min assist;sitting Pt/caregiver will Perform Home Exercise Program: Increased strength;Right Upper extremity;Left upper extremity;With Supervision Additional ADL Goal #1: Pt will increase to modA for bed mobility as precursor for OOB ADL tasks. Additional ADL Goal #2: Pt will tolerate sitting EOB x10 mins with minguardA overall in prep for OOB ADL.  Plan Discharge plan remains appropriate    Co-evaluation                  AM-PAC OT "6 Clicks" Daily Activity     Outcome Measure   Help from another person eating meals?: Total Help from another person taking care of personal grooming?: A Lot Help from another person toileting, which includes using toliet, bedpan, or urinal?: Total Help from another person bathing (including washing, rinsing, drying)?: Total Help from another person to put on and taking off regular upper body clothing?: Total Help from another person to put on and taking off regular lower body clothing?: Total 6 Click Score: 7    End of Session Equipment Utilized During Treatment: Oxygen(Vent via trach)  OT Visit Diagnosis: Muscle weakness (generalized) (M62.81);Pain Pain - Right/Left: Left Pain - part of body: Leg   Activity Tolerance Patient tolerated treatment well   Patient Left in bed;with call bell/phone within reach;with nursing/sitter in room   Nurse Communication Mobility status        Time: 8889-1694 OT Time Calculation (min): 46 min  Charges: OT General Charges $OT Visit: 1 Visit OT Treatments $Therapeutic Activity: 38-52 mins  Winferd Wease MSOT, OTR/L Acute Rehab Pager: [phone] Office: [phone]   Theodoro Grist My [place] 11/13/2019, 5:25 PM

## 2019-11-13 NOTE — TOC Progression Note (Signed)
Transition of Care [facility]) - Progression Note    Patient Details  Name: Brady [name] MRN: [id number] Date of Birth: 05-16-1956  Transition of Care [facility]) CM/SW Contact  Glennon Mac, RN Phone Number: 11/13/2019, 3:31 PM  Clinical Narrative:    LVM with [name] SW at [facility] requesting callback to verify how many Medicare days patient has left and if [facility] had started [facility] application. [name] [facility] [phone]                                                      Social Determinants of Health (SDOH) Interventions    Readmission Risk Interventions No flowsheet data found.  Quintella Baton, RN, BSN  Trauma/Neuro ICU Case Manager [phone]

## 2019-11-13 NOTE — Progress Notes (Signed)
NAME:  Brady [name], MRN:  [id number], DOB:  1956/03/01, LOS: 24 ADMISSION DATE:  22-Oct-2019, CONSULTATION DATE:  11/13/19 REFERRING MD:  Sande Brothers, CHIEF COMPLAINT:   Afib, hypotension  Brief History   64 year old male with past medical history of Pseudomonas/Serratia pneumonia and chronic hypercarbic respiratory failure requiring trach and vent dependent who was sent from [facility] for atrial fibrillation and hypotension.  This improved with IV fluids.  Imaging shows right lower lobe consolidation.  Admit to progressive care with PCCM consult for vent management  Past Medical History  Spinal stenosis, hepatitis C, hypertension, chronic respiratory failure trach and vent dependent  Significant Hospital Events   3/19-admit to progressive care 4/1-  A. fib RVR, transferred to 4 N. 4/9 - Diagnostic and therapeutic bronchoscopy for mucous plugging 4/10 - Therapeutic bronchoscopy for mucous plurgging Consults:  PCCM General surgery  Procedures:   Bronchoscopy 3/20 >> therapeutic aspiration of thick, purulent secretions and 29mm x 31mm chipped tooth suctioned from RLL  Bronchoscopy 4/3 >> therapeutic aspiration of secretions right greater than left  4/9 - Diagnostic and therapeutic bronchoscopy for mucous plugging  4/10 - Therapeutic bronchoscopy for mucous plurgging Significant Diagnostic Tests:  3/19 CT abdomen pelvis>>Mildly dilated fluid-filled loops of proximal and mid small bowel with transition to decompressed distal and terminal ileum suggestive of low-grade bowel obstruction 3/19 CT chest>>Complete collapse of the RLL secondary to aspirated debris within the RLL bronchus. Consolidation in the posterior segments of the RUL concerning for aspiration.Cholelithiasis with questionable gallbladder wall thickening. 3/19-3/20: significant stool burden on CT and KUB (personal interpretation) 3/23> Echo: LVEF 55-60% without RWMA and with trivial mitral valve regurgitation  Micro Data:   3/19 SARS-Covid-2>> negative 3/19 urine culture>> E. Faecalis  3/19 blood cultures x2>> negative x5 days 3/20 respiratory culture >> Serratia marcescens, R-cefazolin 3/29 blood cultures x2 >>ng 4/5 Trach asp >> few serrratia  Antimicrobials:  Cefepime 3/19>>3/26 Flagyl 3/19 Vancomycin 3/19>3/21 Bactrim 3/26>>3/28 Bactrim 4/8 >> 4/12  Interim history/subjective:  Currently on ATC   Objective   Blood pressure (!) 116/56, pulse 81, temperature 98.1 F (36.7 C), temperature source Oral, resp. rate 20, height 5\' 8"  (1.727 m), weight 98 kg, SpO2 98 %.    Vent Mode: CPAP;PSV FiO2 (%):  [40 %] 40 % Set Rate:  [12 bmp] 12 bmp Vt Set:  [500 mL] 500 mL PEEP:  [8 cmH20] 8 cmH20 Pressure Support:  [8 cmH20] 8 cmH20 Plateau Pressure:  [12 cmH20-18 cmH20] 15 cmH20   Intake/Output Summary (Last 24 hours) at 11/13/2019 0834 Last data filed at 11/13/2019 0600 Gross per 24 hour  Intake 880 ml  Output 1250 ml  Net -370 ml   Filed Weights   11/11/19 0500 11/11/19 0800 11/12/19 0500  Weight: 136 kg 45.6 kg 98 kg   Blood pressure (!) 116/56, pulse 81, temperature 98.1 F (36.7 C), temperature source Oral, resp. rate 20, height 5\' 8"  (1.727 m), weight 98 kg, SpO2 98 %.  Physical Exam: General: Ill-appearing man, no distress on ATC HENT: Trach in good position, stoma looks good, strong cough, no secretions Respiratory: Coarse bilaterally no wheeze, decreased right base Cardiovascular: Regular, distant, no murmur Extremities: No edema Neuro: Awake, follows commands, attempts to phonate and communicate  Resolved Hospital Problem list   SBO  Assessment & Plan:  Right lower lobe collapse, chronic trach and vent dependent respiratory failure Serratia PNA, history of Pseudomonas pneumonia Difficulty with pulmonary hygiene, suspect at least in part due to his dependence on positive pressure. Try to  work towards ATC as he can tolerate.  Will likely be a very slow process of vent  liberalization (if even possible).  Back to MV for most of the day, and overnight. Aggressive pulmonary toilet, chest PT, nebs Second course of empiric Bactrim to end today 4/12.  Suspect that he is colonized with Serratia  Transient Atrial fibrillation/RVR >> in NSR now Follow telemetry Amiodarone discontinued/completed  Chronic hypotension Continue midodrine as ordered  Hyperkalemia, question due to Bactrim.  Intact renal function Lokelma ordered, likely DC 4/12  The patient is critically ill with multiple organ systems failure and requires high complexity decision making for assessment and support, frequent evaluation and titration of therapies, application of advanced monitoring technologies and extensive interpretation of multiple databases.   Independent CC time 31 minutes  Baltazar Apo, MD, PhD 11/13/2019, 8:40 AM [facility] [phone] or if no answer [phone]

## 2019-11-14 ENCOUNTER — Inpatient Hospital Stay (HOSPITAL_COMMUNITY): Payer: Medicare Other

## 2019-11-14 LAB — BASIC METABOLIC PANEL
Anion gap: 11 (ref 5–15)
BUN: 33 mg/dL — ABNORMAL HIGH (ref 8–23)
CO2: 31 mmol/L (ref 22–32)
Calcium: 9.2 mg/dL (ref 8.9–10.3)
Chloride: 88 mmol/L — ABNORMAL LOW (ref 98–111)
Creatinine, Ser: 0.58 mg/dL — ABNORMAL LOW (ref 0.61–1.24)
GFR calc Af Amer: >60 mL/min (ref >60)
GFR calc non Af Amer: >60 mL/min (ref >60)
Glucose, Bld: 107 mg/dL — ABNORMAL HIGH (ref 70–99)
Potassium: 4.4 mmol/L (ref 3.5–5.1)
Sodium: 130 mmol/L — ABNORMAL LOW (ref 135–145)

## 2019-11-14 LAB — CBC
HCT: 32.3 % — ABNORMAL LOW (ref 39.0–52.0)
Hemoglobin: 10.3 g/dL — ABNORMAL LOW (ref 13.0–17.0)
MCH: 30.6 pg (ref 26.0–34.0)
MCHC: 31.9 g/dL (ref 30.0–36.0)
MCV: 95.8 fL (ref 80.0–100.0)
Platelets: 436 10*3/uL — ABNORMAL HIGH (ref 150–400)
RBC: 3.37 MIL/uL — ABNORMAL LOW (ref 4.22–5.81)
RDW: 15.3 % (ref 11.5–15.5)
WBC: 8.7 10*3/uL (ref 4.0–10.5)
nRBC: 0 % (ref 0.0–0.2)

## 2019-11-14 LAB — GLUCOSE, CAPILLARY
Glucose-Capillary: 112 mg/dL — ABNORMAL HIGH (ref 70–99)
Glucose-Capillary: 89 mg/dL (ref 70–99)
Glucose-Capillary: 97 mg/dL (ref 70–99)

## 2019-11-14 LAB — MAGNESIUM: Magnesium: 1.7 mg/dL (ref 1.7–2.4)

## 2019-11-14 MED ORDER — MAGNESIUM SULFATE 2 GM/50ML IV SOLN
2.0000 g | Freq: Once | INTRAVENOUS | Status: AC
  Administered 2019-11-14: 09:00:00 2 g via INTRAVENOUS
  Filled 2019-11-14: qty 50

## 2019-11-14 NOTE — Progress Notes (Signed)
SLP Cancellation Note  Patient Details Name: Brady [name] MRN: [id number] DOB: 1955-10-04   Cancelled treatment:       Reason Eval/Treat Not Completed: Medical issues which prohibited therapy. Pt returned to vent after very brief trial of TC this morning. SLP continues to follow - note that he is alert and making attempts at communication. May want to consider inline PMV if pt may need more prolonged time for weaning.     Mahala Menghini., M.A. CCC-SLP Acute Rehabilitation Services Pager [phone] Office [phone]  11/14/2019, 12:03 PM

## 2019-11-14 NOTE — Progress Notes (Signed)
Attempted ATC 60% patient lasted 10 minutes due to desat to 81%, color became dusky, excessory muscle use to breathe.  Placed back on full support.

## 2019-11-14 NOTE — TOC Initial Note (Addendum)
Transition of Care [facility]) - Initial/Assessment Note    Patient Details  Name: Brady [name] MRN: [id number] Date of Birth: 1956-04-19  Transition of Care [facility]) CM/SW Contact:    Ella Bodo, RN Phone Number: 11/14/2019, 1:46 PM  Clinical Narrative:  Pt is a 64 yo male admitted from [place] 10/19/2019 due to ileus/SBO. Pt admitted 08/28/19 to [facility] hospital due to pneumonia with hypoxia; requred ventilator with trach PMHx; ACDF 07/19/2018 for myelopathy, Lt foot drop peroneal nerve injury, pt reports 12 spincal surgeries, Spinal stenosis, hepatitis C, hypertension, chronic respiratory failure trach and vent dependent. Met with pt to discuss possible return to [facility] for vent SNF; pt adamantly states that he does not want to go back to [facility] for care.  He states that "[facility] is like a third world country, it is nasty and dirty, and I would rather go out of state than go back there."  He gives permission for me to call his wife if needed to discuss discharge disposition.  Per [employer] rep, pt does have a Medicaid application on file currently.  Will send pt information to [place] vent SNFs:  [facility] in [place] and [facility] in [place].  May have to pursue out of state placement if unable to place in [place].                   Expected Discharge Plan: Skilled Nursing Facility Barriers to Discharge: Continued Medical Work up   Patient Goals and CMS Choice Patient states their goals for this hospitalization and ongoing recovery are:: to not go back to [facility]/ go to a better place CMS Medicare.gov Compare Post Acute Care list provided to:: Patient Choice offered to / list presented to : Patient  Expected Discharge Plan and Services Expected Discharge Plan: [place]   Discharge Planning Services: CM Consult Post Acute Care Choice: [place] Living arrangements for the past 2 months: [place]                                       Prior Living Arrangements/Services Living arrangements for the past 2 months: [place] Lives with:: Facility Resident Patient language and need for interpreter reviewed:: Yes Do you feel safe going back to the place where you live?: No   Pt states he does not want to go back to [facility] SNF  Need for Family Participation in Patient Care: Yes (Comment)     Criminal Activity/Legal Involvement Pertinent to Current Situation/Hospitalization: No - Comment as needed  Activities of Daily Living Home Assistive Devices/Equipment: None ADL Screening (condition at time of admission) Patient's cognitive ability adequate to safely complete daily activities?: Yes Is the patient deaf or have difficulty hearing?: No Does the patient have difficulty seeing, even when wearing glasses/contacts?: No Does the patient have difficulty concentrating, remembering, or making decisions?: No Patient able to express need for assistance with ADLs?: Yes Does the patient have difficulty dressing or bathing?: Yes Independently performs ADLs?: No Communication: Needs assistance Is this a change from baseline?: Pre-admission baseline Dressing (OT): Needs assistance Is this a change from baseline?: Pre-admission baseline Grooming: Needs assistance Is this a change from baseline?: Pre-admission baseline Feeding: Needs assistance Is this a change from baseline?: Pre-admission baseline Bathing: Needs assistance Is this a change from baseline?: Pre-admission baseline Toileting: Needs assistance Is this a  change from baseline?: Pre-admission baseline In/Out Bed: Dependent Is this a change from baseline?: Pre-admission baseline Walks in Home: Dependent Does the patient have difficulty walking or climbing stairs?: Yes Weakness of Legs: Both Weakness of Arms/Hands: Both  Permission Sought/Granted                  Emotional Assessment Appearance::  Appears older than stated age Attitude/Demeanor/Rapport: Engaged Affect (typically observed): Accepting Orientation: : Oriented to Self, Oriented to Place, Oriented to  Time, Oriented to Situation      Admission diagnosis:  Lactic acidosis [E87.2] Small bowel obstruction ([place]) [K56.609] Atrial fibrillation with RVR (HCC) [I48.91] Hypotension [I95.9] S/P bronchoscopy [Z98.890] Patient Active Problem List   Diagnosis Date Noted  . Pressure injury of sacral region, stage 1 10/26/2019  . S/P bronchoscopy   . VAP (ventilator-associated pneumonia) ([place]) 10/21/2019  . Acute on chronic respiratory failure ([place]) 10/21/2019  . Atrial fibrillation with RVR ([place])   . Atelectasis   . Lactic acidosis   . Small bowel obstruction ([place])   . Hypotension 10/05/2019   PCP:  Aaron Edelman, MD Pharmacy:  No Pharmacies Listed    Social Determinants of Health (SDOH) Interventions    Readmission Risk Interventions No flowsheet data found.  Reinaldo Raddle, RN, BSN  Trauma/Neuro ICU Case Manager [phone]

## 2019-11-14 NOTE — Progress Notes (Signed)
Physical Therapy Treatment Patient Details Name: Keyshon Stein MRN: [id number] DOB: 1955-11-18 Today's Date: 11/14/2019    History of Present Illness Pt is a 64 yo male admitted from [facility] 10/04/2019 due to ileus/SBO. Pt admitted 08/28/19 to [facility] hospital due to pneumonia with hypoxia; requred ventilator with trach PMHx; ACDF 07/19/2018 for myelopathy, Lt foot drop peroneal nerve injury, pt reports 12 spincal surgeries, Spinal stenosis, hepatitis C, hypertension, chronic respiratory failure trach and vent dependent.    PT Comments    Session limited by patient's RUE pain ("shocking" pain above lateral elbow). See below for details re: attempts to ease this pain and LE exercises completed. RN aware of pt's pain and reported he had pain medicine recently.     Follow Up Recommendations  SNF;LTACH     Equipment Recommendations  Other (comment)    Recommendations for Other Services       Precautions / Restrictions Precautions Precautions: Fall;Other (comment) Precaution Comments: trach, vent Restrictions Weight Bearing Restrictions: No    Mobility  Bed Mobility                  Transfers Overall transfer level: Needs assistance   Transfers: (OOB to chair)           General transfer comment:  Pt tolerated transfer well with sats briefly 88-89% on PRVC 60% fiO2, recovered to 92-93% with rest  Ambulation/Gait                 Stairs             Wheelchair Mobility    Modified Rankin (Stroke Patients Only)       Balance Overall balance assessment: Needs assistance Sitting-balance support: Bilateral upper extremity supported;Feet supported(pull to sit upright in chair) Sitting balance-Leahy Scale: Poor Sitting balance - Comments: leans to his right Postural control: Posterior lean;Right lateral lean                                  Cognition Arousal/Alertness: Awake/alert Behavior During Therapy: WFL for tasks  assessed/performed Overall Cognitive Status: Difficult to assess                                        Exercises General Exercises - Lower Extremity Ankle Circles/Pumps: PROM;Left;Right;Supine(bil heel cord stretches x 30 seconds) Other Exercises Other Exercises: bil hip internal rotation followed by knee extension. Pt unable to achieve neutral position in either direction with either leg. Tolerated 30 sec stretch x 2 for each leg. Positioned with blanket rolls to lateral thighs to minimize external rotation/abduction at rest. Pillows under calves to "float" heels while stretching his knees/hips    General Comments General comments (skin integrity, edema, etc.): pt very uncomfortable with RUE pain--points to distal humerus, just above elbow lateral side; Reports it is a shocking pain. Pt with known history of cervical myelopathy. Attempted repositioning in more upright sitting and creating an "armrest' to support UE in more relaxed posture. Instructed in cervical ROM exercises with pt reporting increased pain with left lateral flexion and decreased pain with rt lateral flexion (he tends to hold head in rt lateral flexion). Patient reporting no overall improvement in pain by end of session.       Pertinent Vitals/Pain Pain Assessment: Faces Faces Pain Scale: Hurts whole lot Pain Location: RUE Pain Descriptors / Indicators:  Discomfort;Grimacing;Constant;Guarding Pain Intervention(s): Limited activity within patient's tolerance;Monitored during session;Premedicated before session;Repositioned;Patient requesting pain meds-RN notified;Relaxation    Home Living                      Prior Function            PT Goals (current goals can now be found in the care plan section) Acute Rehab PT Goals Patient Stated Goal: to get arm to stop hurting Time For Goal Achievement: 11/20/19 Potential to Achieve Goals: Fair Progress towards PT goals: Not progressing toward goals  - comment(pain limiting participation this date)    Frequency    Min 2X/week      PT Plan Current plan remains appropriate    Co-evaluation              AM-PAC PT "6 Clicks" Mobility   Outcome Measure  Help needed turning from your back to your side while in a flat bed without using bedrails?: Total Help needed moving from lying on your back to sitting on the side of a flat bed without using bedrails?: Total Help needed moving to and from a bed to a chair (including a wheelchair)?: Total Help needed standing up from a chair using your arms (e.g., wheelchair or bedside chair)?: Total Help needed to walk in hospital room?: Total Help needed climbing 3-5 steps with a railing? : Total 6 Click Score: 6    End of Session Equipment Utilized During Treatment: Oxygen(on full vent support) Activity Tolerance: Patient limited by pain Patient left: with call bell/phone within reach;in bed(in semi-chair position) Nurse Communication: Patient requests pain meds PT Visit Diagnosis: Other abnormalities of gait and mobility (R26.89);Muscle weakness (generalized) (M62.81)     Time: 0093-8182 PT Time Calculation (min) (ACUTE ONLY): 28 min  Charges:  $Therapeutic Exercise: 23-37 mins                      Arby Barrette, PT Pager [phone]    Rexanne Mano 11/14/2019, 4:13 PM

## 2019-11-14 NOTE — Progress Notes (Addendum)
  Subjective:  Patient seen at bedside. Patient denies pain. Patient counseled on plan of care, all questions answered.  Objective:   Vital Signs (last 24 hours): Vitals:   11/14/19 0338 11/14/19 0400 11/14/19 0500 11/14/19 0600  BP:  118/68 119/61 (!) 96/55  Pulse:  82 70 82  Resp:  (!) 24 14 18   Temp:  98 F (36.7 C)    TempSrc:  Oral    SpO2: 98% 93% 92% 97%  Weight:      Height:       Physical Exam: General Alert and answers questions appropriately, no acute distress  Cardiac Regular rate and rhythm, no murmurs, rubs, or gallops  Pulmonary Bilateral air movement, mild rhonchorous breath sounds, R > L   CBC Latest Ref Rng & Units 11/14/2019 11/13/2019 11/12/2019  WBC 4.0 - 10.5 K/uL 8.7 10.2 12.5(H)  Hemoglobin 13.0 - 17.0 g/dL 10.3(L) 10.5(L) 10.9(L)  Hematocrit 39.0 - 52.0 % 32.3(L) 33.4(L) 33.8(L)  Platelets 150 - 400 K/uL 436(H) 451(H) 514(H)   BMP Latest Ref Rng & Units 11/14/2019 11/13/2019 11/12/2019  Glucose 70 - 99 mg/dL 01/12/2020) 034(V) 425(Z)  BUN 8 - 23 mg/dL 563(O) 75(I) 43(P)  Creatinine 0.61 - 1.24 mg/dL 29(J) 1.88(C) 1.66(A)  Sodium 135 - 145 mmol/L 130(L) 131(L) 133(L)  Potassium 3.5 - 5.1 mmol/L 4.4 4.8 5.4(H)  Chloride 98 - 111 mmol/L 88(L) 88(L) 90(L)  CO2 22 - 32 mmol/L 31 34(H) 33(H)  Calcium 8.9 - 10.3 mg/dL 9.2 9.2 9.3   Magnesium: 1.7, will replete to goal of 2.0   Assessment/Plan:   Active Problems:   Hypotension   Lactic acidosis   Small bowel obstruction (HCC)   VAP (ventilator-associated pneumonia) (HCC)   Acute on chronic respiratory failure (HCC)   S/P bronchoscopy   Pressure injury of sacral region, stage 1  Patient is a 64 year old male with past medical history significant for ventilatory dependent respiratory failure, hyperlipidemia, atypical depression, COPD, chronic lower back pain admitted on 10/23/2019 from [facility] with SBO and recurrence of Serratia pneumonia.  # Chronic hypoxic respiratory failure Ventilatory dependent,  awaiting placement for LTAC, patient with multiple repeat bronchoscopies due to increased, thick mucus production. Completed repeat course of Bactrim. 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   # SBO Present on admission, treated with enema, laxatives, resolved *Continue tube feeds *Continue with as needed MiraLAX, Senokot  # Hyperkalemia: Potassium was elevated while on Bactrim, treated with Lokelma. Bactrim and Lokelma discontinued yesterday. AM potassium of 4.4 *Lokelma to be discontinued after today  #Paroxysmal A.Fib Currently in sinus. CHAD-VASC2 score of 1 -C/w telemetry  PT/OT:Following, we appreciate their assistance Diet:Tube feeds DVT Ppx:Lovenox65mg  daily Admit Status:Inpatient Dispo: Anticipated dischargepending placement   10/22/2019, MD 11/14/2019, 7:39 AM

## 2019-11-15 DIAGNOSIS — J9621 Acute and chronic respiratory failure with hypoxia: Secondary | ICD-10-CM | POA: Diagnosis not present

## 2019-11-15 LAB — BASIC METABOLIC PANEL
Anion gap: 9 (ref 5–15)
BUN: 31 mg/dL — ABNORMAL HIGH (ref 8–23)
CO2: 32 mmol/L (ref 22–32)
Calcium: 9.3 mg/dL (ref 8.9–10.3)
Chloride: 90 mmol/L — ABNORMAL LOW (ref 98–111)
Creatinine, Ser: 0.44 mg/dL — ABNORMAL LOW (ref 0.61–1.24)
GFR calc Af Amer: >60 mL/min (ref >60)
GFR calc non Af Amer: >60 mL/min (ref >60)
Glucose, Bld: 108 mg/dL — ABNORMAL HIGH (ref 70–99)
Potassium: 4.1 mmol/L (ref 3.5–5.1)
Sodium: 131 mmol/L — ABNORMAL LOW (ref 135–145)

## 2019-11-15 LAB — GLUCOSE, CAPILLARY
Glucose-Capillary: 111 mg/dL — ABNORMAL HIGH (ref 70–99)
Glucose-Capillary: 96 mg/dL (ref 70–99)

## 2019-11-15 NOTE — Progress Notes (Signed)
  Subjective:  Brady [name] was examined and evaluated at bedside this am. He was noted to have new abrasion on his knee but states it doesn't bother him except when he is moved. He requested a bandage be placed over it (RN present to assess/bandage). Discussed plan to await updates for placement as he has declined placement at [facility].   Objective:    Vital Signs (last 24 hours): Vitals:   11/15/19 0321 11/15/19 0400 11/15/19 0500 11/15/19 0600  BP:  117/80 (!) 149/93 124/62  Pulse:  83 85 88  Resp:  15 (!) 21 18  Temp:      TempSrc:      SpO2: 100% 96% 99% 92%  Weight:      Height:        Physical Exam: General Alert and answers questions appropriately, no acute distress  Cardiac Regular rate and rhythm, no murmurs, rubs, or gallops  Pulmonary Clear to auscultation bilaterally without wheezes, rhonchi, or rales  MSK Small, 1.5 cm abrasion over right knee, no drainage, no signs of infection    Assessment/Plan:   Active Problems:   Hypotension   Lactic acidosis   Small bowel obstruction (HCC)   VAP (ventilator-associated pneumonia) (HCC)   Acute on chronic respiratory failure (HCC)   S/P bronchoscopy   Pressure injury of sacral region, stage 1  Patient is a 64 year old male with past medical history significant for ventilatory dependent respiratory failure, hyperlipidemia, atypical depression, COPD, chronic lower back pain admitted on 10/08/2019 from [facility] with SBO and recurrence of Serratia pneumonia.  # Chronic hypoxic respiratory failure Ventilatory dependent, awaiting placement for LTAC, patient with multiple repeat bronchoscopies due to increased, thick mucus production.  Patient has completed repeat course of Bactrim.  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  # SBO Present on admission, treated with enema, laxatives, resolved *Continue tube feeds *Continue with as needed MiraLAX, Senokot  # Hyperkalemia:Potassium was elevated  while on Bactrim, treated with Lokelma. Bactrim and Lokelma discontinued on 11/13/19. Potassium yesterday of 4.4  #Paroxysmal A.Fib Currently in sinus. CHAD-VASC2 score of 1 -C/w telemetry  PT/OT:Following, we appreciate their assistance Diet:Tube feeds DVT Ppx:Lovenox65mg  daily Admit Status:Inpatient Dispo: Anticipated dischargepending placement  [id number]$, MD 11/15/2019, 6:30 AM

## 2019-11-15 NOTE — Clinical Social Work Note (Signed)
Hard faxed SNF referral to [facility] in [place], [place]

## 2019-11-15 NOTE — Progress Notes (Signed)
Pt refused CPT through the bed due to complaining of shoulder pain.

## 2019-11-15 NOTE — Progress Notes (Signed)
NAME:  Janice Seales, MRN:  [id number], DOB:  1956/07/03, LOS: 19 ADMISSION DATE:  10/25/2019, CONSULTATION DATE:  11/15/19 REFERRING MD:  Gilford Rile, CHIEF COMPLAINT:   Afib, hypotension  Brief History   64 year old male with past medical history of Pseudomonas/Serratia pneumonia and chronic hypercarbic respiratory failure requiring trach and vent dependent who was sent from [facility] for atrial fibrillation and hypotension.  This improved with IV fluids.  Imaging shows right lower lobe consolidation.  Admit to progressive care with PCCM consult for vent management  Past Medical History  Spinal stenosis, hepatitis C, hypertension, chronic respiratory failure trach and vent dependent  Significant Hospital Events   3/19-admit to progressive care 4/1-  A. fib RVR, transferred to 4 N. 4/9 - Diagnostic and therapeutic bronchoscopy for mucous plugging 4/10 - Therapeutic bronchoscopy for mucous plurgging Consults:  PCCM General surgery  Procedures:   Bronchoscopy 3/20 >> therapeutic aspiration of thick, purulent secretions and 16mm x 66mm chipped tooth suctioned from RLL  Bronchoscopy 4/3 >> therapeutic aspiration of secretions right greater than left  4/9 - Diagnostic and therapeutic bronchoscopy for mucous plugging  4/10 - Therapeutic bronchoscopy for mucous plurgging Significant Diagnostic Tests:  3/19 CT abdomen pelvis>>Mildly dilated fluid-filled loops of proximal and mid small bowel with transition to decompressed distal and terminal ileum suggestive of low-grade bowel obstruction 3/19 CT chest>>Complete collapse of the RLL secondary to aspirated debris within the RLL bronchus. Consolidation in the posterior segments of the RUL concerning for aspiration.Cholelithiasis with questionable gallbladder wall thickening. 3/19-3/20: significant stool burden on CT and KUB (personal interpretation) 3/23> Echo: LVEF 55-60% without RWMA and with trivial mitral valve regurgitation  Micro Data:   3/19 SARS-Covid-2>> negative 3/19 urine culture>> E. Faecalis  3/19 blood cultures x2>> negative x5 days 3/20 respiratory culture >> Serratia marcescens, R-cefazolin 3/29 blood cultures x2 >>ng 4/5 Trach asp >> few serrratia  Antimicrobials:  Cefepime 3/19>>3/26 Flagyl 3/19 Vancomycin 3/19>3/21 Bactrim 3/26>>3/28 Bactrim 4/8 >> 4/12  Interim history/subjective:  Tried ATC on 4/13 but only lasted 10 minutes were for desaturation Chest x-ray 4/13 reviewed, persistent chronic right basilar volume loss (goes back for over a month)   Objective   Blood pressure 124/62, pulse 88, temperature 99.2 F (37.3 C), temperature source Axillary, resp. rate 18, height 5\' 8"  (1.727 m), weight 98 kg, SpO2 92 %.    Vent Mode: PRVC FiO2 (%):  [40 %] 40 % Set Rate:  [12 bmp] 12 bmp Vt Set:  [500 mL] 500 mL PEEP:  [8 cmH20] 8 cmH20 Plateau Pressure:  [14 cmH20-19 cmH20] 14 cmH20   Intake/Output Summary (Last 24 hours) at 11/15/2019 0833 Last data filed at 11/15/2019 0600 Gross per 24 hour  Intake 770.12 ml  Output 1650 ml  Net -879.88 ml   Filed Weights   11/11/19 0500 11/11/19 0800 11/12/19 0500  Weight: 136 kg 45.6 kg 98 kg   Blood pressure 124/62, pulse 88, temperature 99.2 F (37.3 C), temperature source Axillary, resp. rate 18, height 5\' 8"  (1.727 m), weight 98 kg, SpO2 92 %.  Physical Exam: General: Ill-appearing man, no distress on PRVC HENT: Trach in good position, stoma looks good, strong cough, no secretions Respiratory: Coarse bilaterally no wheeze, decreased right base Cardiovascular: Regular, distant, no murmur Extremities: No edema Neuro: Awake, follows commands, attempts to phonate and communicate  Resolved Hospital Problem list   SBO  Assessment & Plan:  Right lower lobe collapse, chronic trach and vent dependent respiratory failure Serratia PNA, history of Pseudomonas pneumonia Difficulty with  pulmonary hygiene, continue to push as he can tolerate.  Chest PT, nebs  Continue PSV, ATC if he can tolerate but he has performed poorly.  Suspect he is going to be chronically ventilator dependent, very prolonged wean if even possible.  Agree with search for vent SNF Suspect colonized with Serratia, bronchitis/pneumonia treated  Transient Atrial fibrillation/RVR >> in NSR now Continue to follow telemetry Amiodarone discontinued/completed  Chronic hypotension Continue midodrine as ordered  Hyperkalemia, question due to Bactrim.  Intact renal function hold Lokelma stopped 4/12    Levy Pupa, MD, PhD 11/15/2019, 8:33 AM [facility] [phone] or if no answer [phone]

## 2019-11-15 NOTE — Progress Notes (Signed)
Patient refused his last CPT for tonight. Patient states his shoulder is really hurting him.

## 2019-11-16 LAB — BASIC METABOLIC PANEL
Anion gap: 10 (ref 5–15)
BUN: 32 mg/dL — ABNORMAL HIGH (ref 8–23)
CO2: 30 mmol/L (ref 22–32)
Calcium: 9.4 mg/dL (ref 8.9–10.3)
Chloride: 95 mmol/L — ABNORMAL LOW (ref 98–111)
Creatinine, Ser: 0.36 mg/dL — ABNORMAL LOW (ref 0.61–1.24)
GFR calc Af Amer: >60 mL/min (ref >60)
GFR calc non Af Amer: >60 mL/min (ref >60)
Glucose, Bld: 114 mg/dL — ABNORMAL HIGH (ref 70–99)
Potassium: 4 mmol/L (ref 3.5–5.1)
Sodium: 135 mmol/L (ref 135–145)

## 2019-11-16 LAB — GLUCOSE, CAPILLARY
Glucose-Capillary: 97 mg/dL (ref 70–99)
Glucose-Capillary: 112 mg/dL — ABNORMAL HIGH (ref 70–99)
Glucose-Capillary: 99 mg/dL (ref 70–99)
Glucose-Capillary: 106 mg/dL — ABNORMAL HIGH (ref 70–99)

## 2019-11-16 MED ORDER — JUVEN PO PACK
1.0000 | PACK | Freq: Two times a day (BID) | ORAL | Status: DC
  Administered 2019-11-16 – 2019-11-18 (×4): 1
  Filled 2019-11-16 (×5): qty 1

## 2019-11-16 MED ORDER — LACTATED RINGERS IV SOLN: INTRAVENOUS | Status: AC

## 2019-11-16 MED ORDER — FREE WATER
200.0000 mL | Status: DC
  Administered 2019-11-16 – 2019-11-17 (×4): 200 mL

## 2019-11-16 NOTE — Progress Notes (Signed)
  Subjective:  Patient seen at bedside. Patient states he is doing well. Patient counseled on plan of care, all questions answered.  Objective:    Vital Signs (last 24 hours): Vitals:   11/16/19 0400 11/16/19 0500 11/16/19 0600 11/16/19 0700  BP: (!) 84/66 109/72 99/76 (!) 88/65  Pulse: (!) 101 (!) 102 (!) 130 (!) 106  Resp: 16 17 18 17   Temp: 98.3 F (36.8 C)     TempSrc: Axillary     SpO2: 100% 100% 100% 100%  Weight:      Height:       Physical Exam: General Alert and answers questions appropriately, no acute distress  Cardiac Regular rate and rhythm, no murmurs, rubs, or gallops  Pulmonary Bilateral air movement, mild rhonchorous breath sounds, R > L  Extremities No peripheral edema    Assessment/Plan:   Active Problems:   Hypotension   Lactic acidosis   Small bowel obstruction (HCC)   VAP (ventilator-associated pneumonia) (HCC)   Acute on chronic respiratory failure (HCC)   S/P bronchoscopy   Pressure injury of sacral region, stage 1  Patient is a 64 year old male with past medical history significant for ventilatory dependent respiratory failure, hyperlipidemia, atypical depression, COPD, chronic lower back pain admitted on 2019-11-07 from [facility] with SBO and recurrence of Serratia pneumonia.  # Chronic hypoxic respiratory failure Ventilatory dependent, awaiting placement for LTAC, patient with multiple repeat bronchoscopies due to increased, thick mucus production. Patient has completed repeat course of Bactrim. 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  # SBO Present on admission, treated with enema, laxatives, resolved *Continue tube feeds *Continue with as needed MiraLAX, Senokot  # Hyperkalemia:Potassium was elevated while on Bactrim, treated with Lokelma. Bactrim and Lokelma discontinued on 11/13/19. Potassium on 4/13 of 4.4  #Paroxysmal A.Fib Currently in sinus. CHAD-VASC2 score of 1 -C/w  telemetry  PT/OT:Following, we appreciate their assistance Diet:Tube feeds DVT Ppx:Lovenox65mg  daily Admit Status:Inpatient Dispo: Anticipated dischargependingplacement  5/13, MD 11/16/2019, 7:12 AM

## 2019-11-16 NOTE — Progress Notes (Signed)
Nutrition Follow-up  DOCUMENTATION CODES:   Morbid obesity  INTERVENTION:   Tube Feeding via PEG: Vital 1.5 at 40 ml/hr Pro-Stat 30 mL 5 times daily Provides 1940 kcals, 140 g of protein and 730 mL of free water  MVI   -1 packet Juven BID, each packet provides 95 calories, 2.5 grams of protein (collagen), and 9.8 grams of carbohydrate (3 grams sugar); also contains 7 grams of L-arginine and L-glutamine, 300 mg vitamin C, 15 mg vitamin E, 1.2 mcg vitamin B-12, 9.5 mg zinc, 200 mg calcium, and 1.5 g  Calcium Beta-hydroxy-Beta-methylbutyrate to support wound healing  Recommend 200 ml free water every 4 hours - ok per MD Total free water: 1930 ml   NUTRITION DIAGNOSIS:   Inadequate oral intake related to inability to eat as evidenced by NPO status.  Being addressed via TF   GOAL:   Patient will meet greater than or equal to 90% of their needs  Meeting with TF   MONITOR:   TF tolerance, Diet advancement, Labs, Weight trends, Skin  REASON FOR ASSESSMENT:   Consult Enteral/tube feeding initiation and management  ASSESSMENT:   64 yo male admitted with RLL collapse with aspiration and mucous plugging, ileus secondary to severe constipation. PMH includes spinal stenosis, hepatitis C, HTN, chronic respiratory failure with trach on vent support with PEG tube since 09/2019  3/19 Admitted, CT abdomen suggestive of low grade bowel obstruction 3/20 Bronch: chipped tooth (3 mm x 5 mm) removed from RLL, thick, purulent secretions removed from RML  Pt remains on vent support via trach, per CCM pt weaning on TC but very little and will need long term facility for vent support.   Patient is currently intubated on ventilator support MV: 12.8 L/min Temp (24hrs), Avg:98.9 F (37.2 C), Min:98.3 F (36.8 C), Max:100.5 F (38.1 C)   Labs: reviewed  Meds: reviewed   Diet Order:   Diet Order            Diet NPO time specified  Diet effective now              EDUCATION  NEEDS:   Not appropriate for education at this time  Skin:  Skin Assessment: Skin Integrity Issues: Skin Integrity Issues:: Stage II Stage I: NA Stage II: sacrum Other: MASD to bilateral buttock  Last BM:  4/15  Height:   Ht Readings from Last 1 Encounters:  11/11/19 5\' 8"  (1.727 m)    Weight:   Wt Readings from Last 1 Encounters:  11/12/19 98 kg    Ideal Body Weight:  70 kg  BMI:  Body mass index is 32.85 kg/m.  Estimated Nutritional Needs:   Kcal:  1550-1750 kcals  Protein:  140-175  Fluid:  >/= 2 L  Marjoria Mancillas P., RD, LDN, CNSC See AMiON for contact information

## 2019-11-16 NOTE — Progress Notes (Signed)
Occupational Therapy Treatment Patient Details Name: Brady [name] MRN: [id number] DOB: [birth year] Today's Date: 11/16/2019    History of present illness Pt is a 64 yo male admitted from [facility] 10/17/2019 due to ileus/SBO. Pt admitted 08/28/19 to [facility] hospital due to pneumonia with hypoxia; requred ventilator with trach PMHx; ACDF 07/19/2018 for myelopathy, Lt foot drop peroneal nerve injury, pt reports 12 spincal surgeries, Spinal stenosis, hepatitis C, hypertension, chronic respiratory failure trach and vent dependent.   OT comments  Pt with less pain today.  He was able to tolerate myofascial release to Rt UE as well as scapular exercises and neck ROM.  Pt more engaged today and requesting theraband and squeeze ball.   Follow Up Recommendations  SNF;LTACH    Equipment Recommendations  Other (comment)    Recommendations for Other Services      Precautions / Restrictions Precautions Precautions: Fall;Other (comment) Precaution Comments: trach, vent       Mobility Bed Mobility                  Transfers                      Balance                                           ADL either performed or assessed with clinical judgement   ADL Overall ADL's : Needs assistance/impaired                                             Vision       Perception     Praxis      Cognition Arousal/Alertness: Awake/alert Behavior During Therapy: WFL for tasks assessed/performed Overall Cognitive Status: Difficult to assess                                 General Comments: Pt following commands and willing to participate in therapy. Pt mouthing words and demonstrating increased problem solving and slow down and change "mouthing" technique when his message is not being understood.         Exercises Other Exercises Other Exercises: myofascial arm pull Rt UE performed and pt tolerated well  Other Exercises: Pt  performed abduction/adduction as well as PNF diagonals with mild resistance bil. shoulders - tolerated well  Other Exercises: Pt reports pain is now intermittent.  He had complained of pain Rt elbow with PT. Assessed for peripheral nerve entrapment.  Pt with negative tinels signs    Shoulder Instructions       General Comments      Pertinent Vitals/ Pain       Pain Assessment: Faces Faces Pain Scale: Hurts little more Pain Location: RUE Pain Descriptors / Indicators: Discomfort;Grimacing;Constant;Guarding Pain Intervention(s): Monitored during session;Premedicated before session  Home Living                                          Prior Functioning/Environment              Frequency  Min 2X/week  Progress Toward Goals  OT Goals(current goals can now be found in the care plan section)  Progress towards OT goals: Progressing toward goals     Plan Discharge plan remains appropriate    Co-evaluation                 AM-PAC OT "6 Clicks" Daily Activity     Outcome Measure   Help from another person eating meals?: Total Help from another person taking care of personal grooming?: A Lot Help from another person toileting, which includes using toliet, bedpan, or urinal?: Total Help from another person bathing (including washing, rinsing, drying)?: Total Help from another person to put on and taking off regular upper body clothing?: Total Help from another person to put on and taking off regular lower body clothing?: Total 6 Click Score: 7    End of Session Equipment Utilized During Treatment: Oxygen  OT Visit Diagnosis: Muscle weakness (generalized) (M62.81);Pain Pain - Right/Left: Right Pain - part of body: Arm   Activity Tolerance Patient tolerated treatment well   Patient Left in bed;with call bell/phone within reach;with nursing/sitter in room   Nurse Communication Mobility status        Time: 1035-1100 OT Time  Calculation (min): 25 min  Charges: OT General Charges $OT Visit: 1 Visit OT Treatments $Neuromuscular Re-education: 23-37 mins  Eber Jones OTR/L Acute Rehabilitation Services Pager [phone] Office [phone]    Jeani Hawking M 11/16/2019, 6:05 PM

## 2019-11-16 NOTE — Progress Notes (Signed)
PCCM Interval Note  No real change in status.  Does some intermittent PSV but not ATC He refused his chest PT 4/14 due to shoulder pain.  He will require vent facility, will be a long-term vent wean (if at all).   No new recommendations beyond pulm hygiene as aggressively as he will tolerate.   Levy Pupa, MD, PhD 11/16/2019, 8:25 AM [facility] [phone] or if no answer [phone]

## 2019-11-17 DIAGNOSIS — E875 Hyperkalemia: Secondary | ICD-10-CM

## 2019-11-17 DIAGNOSIS — R451 Restlessness and agitation: Secondary | ICD-10-CM

## 2019-11-17 DIAGNOSIS — J9611 Chronic respiratory failure with hypoxia: Secondary | ICD-10-CM

## 2019-11-17 DIAGNOSIS — F419 Anxiety disorder, unspecified: Secondary | ICD-10-CM

## 2019-11-17 LAB — COMPREHENSIVE METABOLIC PANEL
ALT: 15 U/L (ref 0–44)
AST: 22 U/L (ref 15–41)
Albumin: 2.5 g/dL — ABNORMAL LOW (ref 3.5–5.0)
Alkaline Phosphatase: 95 U/L (ref 38–126)
Anion gap: 13 (ref 5–15)
BUN: 37 mg/dL — ABNORMAL HIGH (ref 8–23)
CO2: 28 mmol/L (ref 22–32)
Calcium: 9.4 mg/dL (ref 8.9–10.3)
Chloride: 94 mmol/L — ABNORMAL LOW (ref 98–111)
Creatinine, Ser: 0.36 mg/dL — ABNORMAL LOW (ref 0.61–1.24)
GFR calc Af Amer: >60 mL/min (ref >60)
GFR calc non Af Amer: >60 mL/min (ref >60)
Glucose, Bld: 114 mg/dL — ABNORMAL HIGH (ref 70–99)
Potassium: 3.6 mmol/L (ref 3.5–5.1)
Sodium: 135 mmol/L (ref 135–145)
Total Bilirubin: 0.9 mg/dL (ref 0.3–1.2)
Total Protein: 7.1 g/dL (ref 6.5–8.1)

## 2019-11-17 LAB — GLUCOSE, CAPILLARY
Glucose-Capillary: 102 mg/dL — ABNORMAL HIGH (ref 70–99)
Glucose-Capillary: 103 mg/dL — ABNORMAL HIGH (ref 70–99)
Glucose-Capillary: 117 mg/dL — ABNORMAL HIGH (ref 70–99)

## 2019-11-17 LAB — CBC
HCT: 30.5 % — ABNORMAL LOW (ref 39.0–52.0)
Hemoglobin: 9.7 g/dL — ABNORMAL LOW (ref 13.0–17.0)
MCH: 31 pg (ref 26.0–34.0)
MCHC: 31.8 g/dL (ref 30.0–36.0)
MCV: 97.4 fL (ref 80.0–100.0)
Platelets: 370 10*3/uL (ref 150–400)
RBC: 3.13 MIL/uL — ABNORMAL LOW (ref 4.22–5.81)
RDW: 15.7 % — ABNORMAL HIGH (ref 11.5–15.5)
WBC: 9.7 10*3/uL (ref 4.0–10.5)
nRBC: 0 % (ref 0.0–0.2)

## 2019-11-17 MED ORDER — QUETIAPINE FUMARATE 25 MG PO TABS
50.0000 mg | ORAL_TABLET | Freq: Every day | ORAL | Status: DC
  Administered 2019-11-17 – 2019-11-18 (×2): 50 mg
  Filled 2019-11-17: qty 2

## 2019-11-17 MED ORDER — QUETIAPINE FUMARATE 25 MG PO TABS
50.0000 mg | ORAL_TABLET | Freq: Every day | ORAL | Status: DC
  Filled 2019-11-17: qty 2

## 2019-11-17 MED ORDER — LORAZEPAM 2 MG/ML IJ SOLN
1.0000 mg | Freq: Four times a day (QID) | INTRAMUSCULAR | Status: DC | PRN
  Administered 2019-11-17 – 2019-11-18 (×3): 1 mg via INTRAVENOUS
  Filled 2019-11-17 (×3): qty 1

## 2019-11-17 MED ORDER — METOPROLOL TARTRATE 5 MG/5ML IV SOLN
5.0000 mg | Freq: Once | INTRAVENOUS | Status: AC
  Administered 2019-11-17: 5 mg via INTRAVENOUS
  Filled 2019-11-17: qty 5

## 2019-11-17 MED ORDER — LORAZEPAM 2 MG/ML IJ SOLN
1.0000 mg | Freq: Once | INTRAMUSCULAR | Status: AC
  Administered 2019-11-17: 04:00:00 1 mg via INTRAVENOUS
  Filled 2019-11-17: qty 1

## 2019-11-17 MED ORDER — HYDROXYZINE HCL 10 MG PO TABS
10.0000 mg | ORAL_TABLET | Freq: Three times a day (TID) | ORAL | Status: DC | PRN
  Administered 2019-11-18 (×3): 10 mg
  Filled 2019-11-17 (×4): qty 1

## 2019-11-17 MED ORDER — FREE WATER
100.0000 mL | Status: DC
  Administered 2019-11-17 – 2019-11-18 (×7): 100 mL

## 2019-11-17 MED ORDER — QUETIAPINE FUMARATE 25 MG PO TABS
25.0000 mg | ORAL_TABLET | Freq: Every day | ORAL | Status: DC
  Administered 2019-11-18: 25 mg
  Filled 2019-11-17: qty 1

## 2019-11-17 NOTE — Progress Notes (Signed)
Occupational Therapy Treatment Patient Details Name: Brady [name] MRN: [id number] DOB: Jul 19, 1956 Today's Date: 11/17/2019    History of present illness Pt is a 64 yo male admitted from [facility] 03-Nov-2019 due to ileus/SBO. Pt admitted 08/28/19 to [facility] hospital due to pneumonia with hypoxia; requred ventilator with trach PMHx; ACDF 07/19/2018 for myelopathy, Lt foot drop peroneal nerve injury, pt reports 12 spincal surgeries, Spinal stenosis, hepatitis C, hypertension, chronic respiratory failure trach and vent dependent.   OT comments  Pt provided with level 1 theraband and a squeezeball.  He was instructed in initial HEP, but was very fatigued so limited participation.  He reports improved pain Rt UE.   Follow Up Recommendations  SNF;LTACH    Equipment Recommendations  Other (comment)    Recommendations for Other Services      Precautions / Restrictions Precautions Precautions: Fall;Other (comment) Precaution Comments: trach, vent       Mobility Bed Mobility                  Transfers                      Balance                                           ADL either performed or assessed with clinical judgement   ADL                                               Vision       Perception     Praxis      Cognition Arousal/Alertness: Lethargic Behavior During Therapy: Flat affect;WFL for tasks assessed/performed Overall Cognitive Status: Difficult to assess                                          Exercises Other Exercises Other Exercises: Pt had requested theraband and a squeeze ball.  level 1 theraband was provided to pt and tied to bedrails.  He was able to perform 5 repts shoulder hoizontal abduction and adduction, elbow flexion Rt UE, and 5 reps elbow flexion on the Lt.   Other Exercises: Pt able to use squeeze ball in each in hands Other Exercises: Pt performed 5 repst cervical rotation  Lt and Rt  Other Exercises: Performed 5 reps scapular retraction and protraction as well as shoulder circles.  He requested to stop at this time due to fatigue    Shoulder Instructions       General Comments      Pertinent Vitals/ Pain       Pain Assessment: Faces Faces Pain Scale: Hurts a little bit Pain Location: RUE Pain Descriptors / Indicators: Discomfort;Grimacing;Constant;Guarding Pain Intervention(s): Monitored during session  Home Living                                          Prior Functioning/Environment              Frequency  Min 2X/week        Progress Toward Goals  OT Goals(current goals can now be found in the care plan section)  Progress towards OT goals: Progressing toward goals     Plan Discharge plan remains appropriate    Co-evaluation                 AM-PAC OT "6 Clicks" Daily Activity     Outcome Measure   Help from another person eating meals?: Total Help from another person taking care of personal grooming?: A Lot Help from another person toileting, which includes using toliet, bedpan, or urinal?: Total Help from another person bathing (including washing, rinsing, drying)?: Total Help from another person to put on and taking off regular upper body clothing?: Total Help from another person to put on and taking off regular lower body clothing?: Total 6 Click Score: 7    End of Session Equipment Utilized During Treatment: Oxygen  OT Visit Diagnosis: Muscle weakness (generalized) (M62.81);Pain Pain - Right/Left: Right Pain - part of body: Arm   Activity Tolerance Patient tolerated treatment well   Patient Left in bed;with call bell/phone within reach;with nursing/sitter in room   Nurse Communication Mobility status        Time: 2951-8841 OT Time Calculation (min): 8 min  Charges: OT General Charges $OT Visit: 1 Visit OT Treatments $Therapeutic Exercise: 8-22 mins  Nilsa Nutting., OTR/L Acute  Rehabilitation Services Pager [phone] Office [phone]    Lucille Passy M 11/17/2019, 3:50 PM

## 2019-11-17 NOTE — Progress Notes (Signed)
  Subjective:  Brady [name] was examined and evaluated at bedside this am. He tried to express his concerns and believes he was in a different room overnight. Per staff had agitation overnight, difficulty sleeping.  Objective:    Vital Signs (last 24 hours): Vitals:   11/17/19 0500 11/17/19 0600 11/17/19 0607 11/17/19 0608  BP: 104/61 107/73    Pulse: (!) 127 (!) 125 (!) 127 (!) 125  Resp: 18 (!) 21    Temp:      TempSrc:      SpO2: 99% 96%    Weight:      Height:       Physical Exam: General Appears mildly disoriented, no acute distress  Cardiac Regular rate and rhythm, no murmurs, rubs, or gallops  Pulmonary Bilateral air movement, mild rhonchorous breath sounds bilaterally, R > L  Extremities No peripheral edema   CBC Latest Ref Rng & Units 11/17/2019 11/14/2019 11/13/2019  WBC 4.0 - 10.5 K/uL 9.7 8.7 10.2  Hemoglobin 13.0 - 17.0 g/dL 9.2(E) 10.3(L) 10.5(L)  Hematocrit 39.0 - 52.0 % 30.5(L) 32.3(L) 33.4(L)  Platelets 150 - 400 K/uL 370 436(H) 451(H)   BMP Latest Ref Rng & Units 11/17/2019 11/16/2019 11/15/2019  Glucose 70 - 99 mg/dL 268(T) 419(Q) 222(L)  BUN 8 - 23 mg/dL 79(G) 92(J) 19(E)  Creatinine 0.61 - 1.24 mg/dL 1.74(Y) 8.14(G) 8.18(H)  Sodium 135 - 145 mmol/L 135 135 131(L)  Potassium 3.5 - 5.1 mmol/L 3.6 4.0 4.1  Chloride 98 - 111 mmol/L 94(L) 95(L) 90(L)  CO2 22 - 32 mmol/L 28 30 32  Calcium 8.9 - 10.3 mg/dL 9.4 9.4 9.3     Assessment/Plan:   Active Problems:   Hypotension   Lactic acidosis   Small bowel obstruction (HCC)   VAP (ventilator-associated pneumonia) (HCC)   Acute on chronic respiratory failure (HCC)   S/P bronchoscopy   Pressure injury of sacral region, stage 1  Patient is a 64 year old male with past medical history significant for ventilatory dependent respiratory failure, hyperlipidemia, atypical depression, COPD, chronic lower back pain admitted on 10/12/2019 from [facility] with SBO and recurrence of Serratia pneumonia.  # Chronic hypoxic  respiratory failure Ventilatory dependent, awaiting placement for LTAC, patient with multiple repeat bronchoscopies due to increased, thick mucus production. Patient has completed repeat course of Bactrim. 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 Patient did have low-grade fever overnight with Tmax of 100.5. Will continue to monitor fever curve, WBC, respiratory status.  # SBO Present on admission, treated with enema, laxatives, resolved *Continue tube feeds *Continue with as needed MiraLAX, Senokot *Free water flushes restarted per RD yesterday. Previously was hypernatremic during hospitalization and with 200 ml Q4HR became hyponatremic. Will decrease dosage from 200 ml to 100 ml Q4HR and monitor sodium. Sodium WNL this AM at 135  # Hyperkalemia:Potassium was elevated while on Bactrim, treated with Lokelma. Bactrim and Lokelma discontinuedon 11/13/19.Potassium returned to WNL  #Paroxysmal A.Fib Currently in sinus. CHAD-VASC2 score of 1 -C/w telemetry  PT/OT:Following, we appreciate their assistance Diet:Tube feeds DVT Ppx:Lovenox65mg  daily Admit Status:Inpatient Dispo: Anticipated dischargependingplacement  UDJ:[id number]$, MD 11/17/2019, 6:29 AM

## 2019-11-17 NOTE — Progress Notes (Signed)
eLink Physician-Brief Progress Note Patient Name: Brady [name] DOB: 1956-07-01 MRN: [id number]   Date of Service  11/17/2019  HPI/Events of Note  Afib with HR 110-120 bpm.  eICU Interventions  Metoprolol 5mg  IV x1 dose.     Intervention Category Intermediate Interventions: Arrhythmia - evaluation and management  Alika Saladin Arneta Mahmood 11/17/2019, 5:48 AM

## 2019-11-17 NOTE — Progress Notes (Signed)
  Date: 11/17/2019  Patient name: Brady [name]  Medical record number: [id number]  Date of birth: October 27, 1955        I have seen and evaluated this patient and I have discussed the plan of care with the house staff. Please see Dr. Kemper Durie note for complete details. I concur with his findings and plan.   We discussed this patient at bedside with nursing.  He apparently is having agitation at night, confused.  He is on seroquel, so we will consider increasing that medication.  He also has medication for anxiety with hydroxyzine.  He cannot speak or write due to weakness, so reading his lips today was rather difficult.  It seems he had hallucinations overnight possibly, or merely severe delirium.  I would consider having a GOC discussion with him and/or getting palliative care involved.  In person discussion with Dr. Delton Coombes in PCCM and they feel (I agree) that he will likely continue to be vent dependent and not be able to wean off the ventilator given his secretions and mucus plugging that have not resolved with multiple bronchoscopies.  Placement will need to be an LTAC at this point.   Inez Catalina, MD 11/17/2019, 2:17 PM

## 2019-11-17 NOTE — Progress Notes (Signed)
Contacted E-Link and spoke with RN, who said she will speak with E-Link MD. Patient's heart rate sustaining in the 130s and 140s. Patient is anxious. Awaiting new orders. Will continue to monitor.

## 2019-11-17 NOTE — Progress Notes (Signed)
eLink Physician-Brief Progress Note Patient Name: Brady [name] DOB: [birth year] MRN: [id number]   Date of Service  11/17/2019  HPI/Events of Note  Sinus tachycardia in setting of anxiety/angitation. RN requests dose of Ativan earlier than due.   eICU Interventions  Ordered 1mg  Ativan IV x1 dose.     Intervention Category Minor Interventions: Agitation / anxiety - evaluation and management  [name] 11/17/2019, 3:46 AM

## 2019-11-17 NOTE — Progress Notes (Signed)
This RN spoke to E-Link RN who said she will speak with E-Link MD. Patient's heart rate is sustaining 110s to 120s, and is currently irregular. Awaiting new orders, will continue to monitor.

## 2019-11-18 ENCOUNTER — Inpatient Hospital Stay (HOSPITAL_COMMUNITY): Payer: Medicare Other

## 2019-11-18 DIAGNOSIS — J9621 Acute and chronic respiratory failure with hypoxia: Secondary | ICD-10-CM | POA: Diagnosis not present

## 2019-11-18 DIAGNOSIS — J95851 Ventilator associated pneumonia: Secondary | ICD-10-CM | POA: Diagnosis not present

## 2019-11-18 LAB — BASIC METABOLIC PANEL
Anion gap: 9 (ref 5–15)
BUN: 35 mg/dL — ABNORMAL HIGH (ref 8–23)
CO2: 29 mmol/L (ref 22–32)
Calcium: 9.1 mg/dL (ref 8.9–10.3)
Chloride: 94 mmol/L — ABNORMAL LOW (ref 98–111)
Creatinine, Ser: 0.31 mg/dL — ABNORMAL LOW (ref 0.61–1.24)
GFR calc Af Amer: >60 mL/min (ref >60)
GFR calc non Af Amer: >60 mL/min (ref >60)
Glucose, Bld: 112 mg/dL — ABNORMAL HIGH (ref 70–99)
Potassium: 4.1 mmol/L (ref 3.5–5.1)
Sodium: 132 mmol/L — ABNORMAL LOW (ref 135–145)

## 2019-11-18 LAB — GLUCOSE, CAPILLARY
Glucose-Capillary: 102 mg/dL — ABNORMAL HIGH (ref 70–99)
Glucose-Capillary: 118 mg/dL — ABNORMAL HIGH (ref 70–99)
Glucose-Capillary: 112 mg/dL — ABNORMAL HIGH (ref 70–99)

## 2019-11-18 MED ORDER — MORPHINE 100MG IN NS 100ML (1MG/ML) PREMIX INFUSION
10.0000 mg/h | INTRAVENOUS | Status: DC
  Administered 2019-11-18 – 2019-11-19 (×2): 5 mg/h via INTRAVENOUS
  Filled 2019-11-18: qty 200

## 2019-11-18 MED ORDER — HALOPERIDOL LACTATE 5 MG/ML IJ SOLN
5.0000 mg | INTRAMUSCULAR | Status: DC | PRN
  Administered 2019-11-18 – 2019-11-19 (×2): 5 mg via INTRAVENOUS
  Filled 2019-11-18 (×2): qty 1

## 2019-11-18 MED ORDER — ALBUTEROL SULFATE (2.5 MG/3ML) 0.083% IN NEBU
2.5000 mg | INHALATION_SOLUTION | RESPIRATORY_TRACT | Status: DC
  Administered 2019-11-18 (×2): 2.5 mg via RESPIRATORY_TRACT
  Filled 2019-11-18 (×2): qty 3

## 2019-11-18 MED ORDER — ONDANSETRON HCL 4 MG/2ML IJ SOLN: 4.0000 mg | Freq: Four times a day (QID) | INTRAMUSCULAR | Status: DC | PRN

## 2019-11-18 MED ORDER — GLYCOPYRROLATE 0.2 MG/ML IJ SOLN: 0.2000 mg | INTRAMUSCULAR | Status: DC | PRN

## 2019-11-18 MED ORDER — PROPOFOL 1000 MG/100ML IV EMUL
5.0000 ug/kg/min | INTRAVENOUS | Status: DC
  Administered 2019-11-18: 15:00:00 5 ug/kg/min via INTRAVENOUS
  Filled 2019-11-18: qty 100

## 2019-11-18 MED ORDER — MORPHINE BOLUS VIA INFUSION
4.0000 mg | INTRAVENOUS | Status: DC | PRN
  Administered 2019-11-18 – 2019-11-19 (×5): 4 mg via INTRAVENOUS
  Filled 2019-11-18: qty 4

## 2019-11-18 MED ORDER — ACETYLCYSTEINE 20 % IN SOLN
2.0000 mL | RESPIRATORY_TRACT | Status: DC
  Administered 2019-11-18 (×2): 2 mL via RESPIRATORY_TRACT
  Filled 2019-11-18 (×2): qty 4

## 2019-11-18 MED ORDER — BIOTENE DRY MOUTH MT LIQD: 15.0000 mL | OROMUCOSAL | Status: DC | PRN

## 2019-11-18 MED ORDER — FENTANYL CITRATE (PF) 100 MCG/2ML IJ SOLN
50.0000 ug | INTRAMUSCULAR | Status: DC | PRN
  Administered 2019-11-18 (×2): 50 ug via INTRAVENOUS
  Filled 2019-11-18 (×2): qty 2

## 2019-11-18 MED ORDER — MAGIC MOUTHWASH
15.0000 mL | Freq: Four times a day (QID) | ORAL | Status: DC | PRN
  Filled 2019-11-18: qty 15

## 2019-11-18 MED ORDER — SODIUM CHLORIDE 0.9 % IV SOLN
12.5000 mg | Freq: Four times a day (QID) | INTRAVENOUS | Status: DC | PRN
  Filled 2019-11-18: qty 0.5

## 2019-11-18 MED ORDER — GLYCOPYRROLATE 1 MG PO TABS
1.0000 mg | ORAL_TABLET | ORAL | Status: DC | PRN
  Filled 2019-11-18: qty 1

## 2019-11-18 MED ORDER — POLYVINYL ALCOHOL 1.4 % OP SOLN
1.0000 [drp] | Freq: Four times a day (QID) | OPHTHALMIC | Status: DC | PRN
  Filled 2019-11-18: qty 15

## 2019-11-18 MED ORDER — ONDANSETRON 4 MG PO TBDP: 4.0000 mg | ORAL_TABLET | Freq: Four times a day (QID) | ORAL | Status: DC | PRN

## 2019-11-18 MED ORDER — IPRATROPIUM-ALBUTEROL 0.5-2.5 (3) MG/3ML IN SOLN
3.0000 mL | RESPIRATORY_TRACT | Status: DC
  Administered 2019-11-18: 3 mL via RESPIRATORY_TRACT
  Filled 2019-11-18: qty 3

## 2019-11-18 NOTE — Progress Notes (Addendum)
Had prolonged conversation with [name] at bedside with RN present. Discussed [name]'s expression earlier today that he wishes to proceed to comfort care prior to acute decompensation. Discussed in detail current hospital course and development during the day with worsening respiratory failure and evidence of lung collapse on X-ray. Discussed his Serratia pneumonia with significant mucus production requiring multiple bronchoscopies and assessment by PCCM that he will not be able to wean from mechanical ventilation. Discussed the option of proceeding to comfort care with one way extubation vs continuing current full scope of treatment. [name] again expresses that [name] has emphasized 'quality of life' rather than duration and would like to focus on ensure that he is comfortable. She was given time to grieve. Comfort measure orders placed.

## 2019-11-18 NOTE — Progress Notes (Signed)
Spoke to wife, [place], on the phone while at patient's bedside with [facility] doctor. Patient requested wife to come immediately. This nurse has informed security that wife will not arrive until after 8pm and they should allow her up.

## 2019-11-18 NOTE — Progress Notes (Signed)
Pt with low spo2, increased WOB. CCM MD notified. Pt fio2 increased to 100%, Peep increased according to ARDS protocol (initiated now per CCM MD). RT initiated CPT through bed and lavaged with small amount of white/yellow secretions.

## 2019-11-18 NOTE — Progress Notes (Signed)
Contacted by PCCM regarding worsening respiratory status. Discussed case with Dr.Ellison who noted that Mr.Ahr was in worsening respiratory distress requiring FiO2 of 100%. Examined and evaluated at bedside with RN present. Noted to endorse increased work of breathing with significant rales on L side.   Spoke with Ms.Misty Dallman, [phone], and updated her on Mr.Otting's current status. She states that he has previously expressed to her that he would prioritize 'quality of life.' Discussed recent failed weaning trials and likely lifetime ventilator dependence and repeated episodes of respiratory distress due to mucus production. Also explained the current option of transitioning to comfort care as he is in significant distress. She states that she lives about 4-5 hours away and she will visit him tonight to express her wishes with Mr.Milbourne. Mr.Bunyard was able to express that once he had a chance to see Mrs.Sze, he would ready to transition to comfort care.   Plan for family meeting around 8pm. Comfort care orders to be placed once family is ready. Meanwhile, Mr.Eimers is to be full code, full intervention.

## 2019-11-18 NOTE — Progress Notes (Signed)
   Subjective:  Brady [name] is a 64 y.o. with PMH of ventilatory dependent respiratory failure, hld, atypical depression, COPD and chronic lbp admit for serratia pneumonia on hospital day 71  Brady [name] was examined and evaluated at bedside this am. He was observed to be in distress and able to express that he is not feeling well. He mentions requesting pain meds for his back. When discussing his long term plans, he is able to express that he is 'tired' but unable to clarify his exact long term goals.  Objective:  Vital signs in last 24 hours: Vitals:   11/18/19 0358 11/18/19 0400 11/18/19 0500 11/18/19 0600  BP: 108/72 (!) 93/58 92/68 108/66  Pulse: 92 86 86 86  Resp: (!) 27 (!) 23 20 17   Temp:  99 F (37.2 C)    TempSrc:  Axillary    SpO2: 98% 98% 98% 100%  Weight:      Height:       Gen: Well-developed, Chronically ill-appearing, NAD HEENT: Poor dentition Neck: Trach in place w/ vent functioning CV: Tachycardic, regular rhythm, S1, S2 normal Pulm: Right sided with diminished breath sounds, Left basilar crackles Abd: Soft, BS+, NTND, No rebound, no guarding Extm: ROM intact, Peripheral pulses intact, No peripheral edema Skin: Diaphoretic, warm, knee wound covered with bandaging  Assessment/Plan:  Active Problems:   Hypotension   Lactic acidosis   Small bowel obstruction (HCC)   VAP (ventilator-associated pneumonia) (HCC)   Acute on chronic respiratory failure (HCC)   S/P bronchoscopy   Pressure injury of sacral region, stage 1   Brady [name] is a 64 y.o. with PMH of ventilatory dependent respiratory failure, hld, atypical depression, COPD and chronic lbp admit for serratia pneumonia  Chronic hypoxic respiratory failure S/p tracheosotmy and vent dependent. Currently awaiting LTAC placement. Over the last couple weeks required repeated bronchoscopies due to thick mucus production. On exam decreased breath sounds on right side concerning for mucus plug. Appreciate  PCCM assistance. - PCCM recs: Mucomyst nebulizer, albuterol  - Repeat Chest X-ray - C/w chest physiotherapy  Goals of Care discussion Prior discussion with PCCM revealed that he has failed multiple weaning trials and will likely be long term ventilator dependent. In light of this news, goals of care discussion was initiated today. Unfortunately complicated by difficulty with communication due to Brady [name]'s inability to speak. Will need to find a way for more effective communications to re-assess. - C/w goals of care discussion - Palliative consult  DVT prophx: lovenox Diet: tube feeds Bowel: Miralax Code: Full  Dispo: Anticipated discharge in approximately once placement into 3-4 day(s).   77, MD 11/18/2019, 6:35 AM Pager: [phone]

## 2019-11-18 NOTE — Progress Notes (Signed)
Alerted by RT that patient's O2 sat was dropping along with PVCs and trigeminy. Upon assessment, patient was guppy breathing and had a dusky appearance. Paged TRH 2x with no return call. Paged CCM. Orders given for Fentanyl and 5mg  Haldol for sedation. Passed phone to RT for further orders.  Will continue to monitor.

## 2019-11-18 NOTE — Progress Notes (Signed)
NAME:  Brady [name], MRN:  [id number], DOB:  October 04, 1955, LOS: 17 ADMISSION DATE:  10/09/2019, CONSULTATION DATE:  11/18/19 REFERRING MD:  Gilford Rile, CHIEF COMPLAINT:   Afib, hypotension  Brief History   64 year old male [facility] patient  with past medical history of Pseudomonas/Serratia pneumonia and chronic hypercarbic respiratory failure requiring trach and vent dependent who was sent from [facility] for atrial fibrillation and hypotension.  This improved with IV fluids.  Imaging shows right lower lobe consolidation.  Admit to progressive care with PCCM consult for vent management  Past Medical History  Spinal stenosis, hepatitis C, hypertension, chronic respiratory failure trach and vent dependent  Significant Hospital Events   3/19-admit to progressive care 4/1-  A. fib RVR, transferred to 4 N. 4/9 - Diagnostic and therapeutic bronchoscopy for mucous plugging 4/10 - Therapeutic bronchoscopy for mucous plurgging Consults:  PCCM General surgery  Procedures:   Bronchoscopy 3/20 >> therapeutic aspiration of thick, purulent secretions and 37m x 587mchipped tooth suctioned from RLL  Bronchoscopy 4/3 >> therapeutic aspiration of secretions right greater than left  4/9 - Diagnostic and therapeutic bronchoscopy for mucous plugging  4/10 - Repeat Therapeutic bronchoscopy for mucous plurgging   Significant Diagnostic Tests:  3/19 CT abdomen pelvis>>Mildly dilated fluid-filled loops of proximal and mid small bowel with transition to decompressed distal and terminal ileum suggestive of low-grade bowel obstruction 3/19 CT chest>>Complete collapse of the RLL secondary to aspirated debris within the RLL bronchus. Consolidation in the posterior segments of the RUL concerning for aspiration.Cholelithiasis with questionable gallbladder wall thickening. 3/19-3/20: significant stool burden on CT and KUB (personal interpretation) 3/23> Echo: LVEF 55-60% without RWMA and with trivial mitral valve  regurgitation  Micro Data:  3/19 SARS-Covid-2>> negative 3/19 urine culture>> E. Faecalis  3/19 blood cultures x2>> negative   3/20 respiratory culture >> Serratia marcescens, R-cefazolin 3/29 blood cultures x2 >>ng 4/5 Trach asp >> few serrratia still R to Ancef  4/9 BAL for fungus > neg KOH >>> 4/9 BAL  >  afb neg >>>  4/9 BAL > serratia   Antimicrobials:  Cefepime 3/19>>3/26 Flagyl 3/19 only  Vancomycin 3/19>3/21 Bactrim 3/26>>3/28 Bactrim 4/8 >> 4/12  Interim history/subjective:  Periods of agitation and increased wob on prvc   Objective   Blood pressure 105/80, pulse 99, temperature 99.3 F (37.4 C), temperature source Oral, resp. rate 19, height _0  (1.727 m), weight 99.6 kg, SpO2 97 %.    Vent Mode: PRVC FiO2 (%):  [40 %] 40 % Set Rate:  [12 bmp] 12 bmp Vt Set:  [500 mL] 500 mL PEEP:  [8 cmH20] 8 cmH20 Plateau Pressure:  [17 cmH20-22 cmH20] 22 cmH20   Intake/Output Summary (Last 24 hours) at 11/18/2019 0908 Last data filed at 11/18/2019 0800 Gross per 24 hour  Intake 420 ml  Output 1350 ml  Net -930 ml   Filed Weights   11/11/19 0800 11/12/19 0500 11/17/19 0454  Weight: 45.6 kg 98 kg 99.6 kg   Blood pressure 105/80, pulse 99, temperature 99.3 F (37.4 C), temperature source Oral, resp. rate 19, height _1  (1.727 m), weight 99.6 kg, SpO2 97 %.  Physical Exam: Tmax 99.3  Pt alert, anxious/ sticks out tongue to req  No jvd Oropharynx dry mucosa/ dentition poor  Neck supple Lungs with a  Coarse  exp > insp rhonchi bilaterally RRR no s3 or or sign murmur Abd mod distended, limited excursion  Extr warm with no edema or clubbing noted      pCXR  4/17 pending   Resolved Hospital Problem list   SBO  Assessment & Plan:  Right lower lobe collapse, chronic trach and vent dependent respiratory failure Serratia PNA, history of Pseudomonas pneumonia  >>> very high wob this am with low tidal volumes and air trapping on graphics so will increase Vt but also  needs better control of agitation longterm but for now rec acutely control it rec haldol  5 / fentanyl 50 mg IV q 4 h prn   >>> recheck cxr pending ??  Start rx for HCAP ? ?    Transient Atrial fibrillation/RVR  > resolved  Continue to follow telemetry Amiodarone discontinued   Chronic hypotension Continue midodrine    Hyperkalemia, question due to Bactrim.  Intact renal function   Lokelma stopped 4/12   The patient is critically ill with multiple organ systems failure and requires high complexity decision making for assessment and support, frequent evaluation and titration of therapies, application of advanced monitoring technologies and extensive interpretation of multiple databases. Critical Care Time devoted to patient care services described in this note is 40 minutes.

## 2019-11-18 NOTE — Progress Notes (Signed)
RT extubate/discontinued MV on pt as per extubation/comfort care orders. Pt is a chronic trach/vent, trach still in place, pt placed on ATC 5 Lpm 28% for comfort. RT will continue to monitor.

## 2019-11-24 LAB — FUNGUS CULTURE RESULT

## 2019-11-24 LAB — FUNGAL ORGANISM REFLEX

## 2019-11-24 LAB — FUNGUS CULTURE WITH STAIN

## 2019-12-02 NOTE — Progress Notes (Signed)
Time of Death 1316 Pronounced by Milta Deiters RN and Aris Lot RN. Contacted CDS with TOD, patient is not eligible for donor status. Post-Mortem care provided by Milta Deiters RN, Deland Pretty RN, and Renold Don NT.  50mg  morphine wasted in stericycle; witnessed by RN.

## 2019-12-02 NOTE — Progress Notes (Signed)
Comfort care.  Discussed with attending team.  They will call us if we are needed.  Will DC consult for now.  Please do not hesitate to call with questions or if we can be of assistance.  Norvel Richards, PA-C Palliative Medicine Office:  [phone]

## 2019-12-02 NOTE — Death Summary Note (Signed)
  Name: Brady [name] MRN: [id number] DOB: 07/08/1956 64 y.o.  Date of Admission: 11-13-19  6:32 PM Date of Discharge: Dec 13, 2019 Attending Physician: Erlinda Hong, MD FACP  Discharge Diagnosis: Active Problems:   Hypotension   Lactic acidosis   Small bowel obstruction (HCC)   VAP (ventilator-associated pneumonia) (HCC)   Acute on chronic respiratory failure (HCC)   S/P bronchoscopy   Pressure injury of sacral region, stage 1  Cause of death: Respiratory failure Time of death: 1316 on 2019-12-13  Disposition and follow-up:   Brady [name] was discharged from [facility] in expired condition.    Hospital Course:  # Small bowel obstruction: Patient is a 64 year old male with past medical history significant for ventilatory dependent respiratory failure secondary pneumonia in February 2021, hyperlipidemia, atypical depression, COPD, chronic lower back pain, PEG tube placement on 10/05/2019 who presented from [facility] on 11-13-19 with small bowel obstruction and recurrence of Serratia pneumonia. On presentation, CT abdomen/pelvis showed findings of low-grade bowel obstruction. NG tube was placed, patient was made NPO and provided supportive care. General surgery consulted and recommended continued medical management. Patient was placed on aggressive bowel regimen with miralax, dulcolax, and colace. NG tube was removed on 10/22/19. Patient had improvement, tolerated feeds and had bowel movements. Electrolytes were repleted as needed  # Serratia Pneumonia: # Ventilatory Dependent Respiratory Failure: Patient underwent bronchoscopy and lavage on 10/25/2019 and respiratory culture revealed rare Serratia marcescens.  Patient was treated with 10-day course of antibiotics with 7 days of cefepime, 3 days of Bactrim. Patient underwent and failed multiple weaning trials off the ventilator. Patient underwent repeat bronchoscopy on 11/04/2019 demonstrated copious mucopurulent secretions  with 150 cc removed. Sputum analysis demonstrated few serratia marcescens and patient was restarted on Bactrim on 2022-12-03 and received a 4 day course. CXR on 11/10/19 demonstrated worsening of right-sided atelectasis/consolidation and repeat bronchoscopy performed on 11/10/19 with removal of thick purulent mucus in the right mainstem bronchus down into lower lobes. With continued thick secretions, bronchoscopy performed again on 11/11/2019 with thick mucopurulent secretions suctioned from the RLL after lavage. On 11/18/19, he developed acutely worsening hypoxic respiratory failure with evidence of mucus plug with right lung collapse. Patient and wife expressed desire to transition to comfort care after prolonged ventilation support. Family was called to bedside and one-way extubation to comfort was done. Comfort care measures were implemented and patient died at 70 on Dec 03, 2019.  # Hypotension: On evening 3/25-3/26, patient was noted to be hypotensive. Metoprolol was discontinued and patient given fluids and vasopressor (phenylephrine) support to maintain MAP>65. Patient was started on midodrine (2.5 mg twice daily) and free water tube-feed flushes (200 mL Q4HRs). Phenylephrine was discontinued on 3/27 and midodrine increased to 5 mg twice daily on 3/28. This was discontinued on 11/18/19 as patient was transitioned to comfort care.  # Atrial Fibrillation Patient with new diagnosis of atrial fibrillation on presentation. CHA2DS2-VASc2 score of 1. TTE on 10/23/19 was without any regional wall motion abnormality or apparent valvular abnormalities. Patient was initially placed on metoprolol 12.5 mg twice daily which was discontinued due to low blood pressure.   Signed: Katherine Roan, MD 12-13-2019, 5:13 PM

## 2019-12-02 NOTE — Progress Notes (Signed)
Chaplain responded to call from nurse requesting support. Patient's wife bedside, tearful. Tired. "I've been praying all night."  "He's not religious, but I am."  Wife understands her husband is "ready to go."  "I just want him to be comfortable" she says. They met through a friend, thirteen years ago.  They have not been able to talk for months, since he was intubated, but knows that he wants quality of life.  She says she plans to get his signature tattooed, so that he will always be close to her heart.  Chaplain offered support and prayer.  And given that the patient's wife is driving long distances, offered her to contact the Department of spiritual Care to stay at the [facility] free of charge if she needs a place to stay and it's available.  Chaplain suggested she call today again if she wants another visit. Rev. Tamsen Snider [phone]

## 2019-12-02 NOTE — Progress Notes (Signed)
  Subjective:  Patient seen on bedside. Patient denies any pain difficulty breathing.  Objective:    Vital Signs (last 24 hours): Vitals:   11/18/19 2000 11/18/19 2018 11/18/19 2341 November 22, 2019 0400  BP: 115/74     Pulse: 98 100 99 78  Resp: (!) 25 (!) 22 14 10   Temp: 98 F (36.7 C)     TempSrc: Axillary     SpO2: 100% 100% 91% (!) 88%  Weight: 99.6 kg     Height: 5\' 8"  (1.727 m)       Physical Exam: General Resting in bed, no acute distress  Pulmonary No respiratory distress, normal work of breathing  Neurology Alert and responds to questions appropriately, no gross deficit   Assessment/Plan:   Active Problems:   Hypotension   Lactic acidosis   Small bowel obstruction (HCC)   VAP (ventilator-associated pneumonia) (HCC)   Acute on chronic respiratory failure (HCC)   S/P bronchoscopy   Pressure injury of sacral region, stage 1   Patient is a 64 year old male with past medical history of ventilator dependent respiratory failure, hyperlipidemia, atypical depression, COPD and chronic lower back pain admitted for small bowel obstruction and recurrence of Serratia pneumonia.  # Chronic hypoxic respiratory failure: Status post tracheosteomy and vent dependent. Patient with repeat bronchoscopies during this admission and multiple antibiotic courses for serratia pneumonia. 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 Yesterday patient experienced worsening respiratory status and repeat CXR demonstrated significant worsening of volume loss in right hemithorax. Per patient and wife's wishes, patient was transitioned to comfort care and extubated yesterday evening. Now on trach collar. *Continue comfort measures, including: Morphine 4 mg Q83min PRN for uncontrolled pain, distress, tachypnea + basal dose increase to Morphine 10 mg/hr. Robinul PRN for excessive secretions  PT/OT: Consult cancelled, not indicated at this time Admit Status: Inpatient Dispo: Comfort care, placement not  needed at this time. TOC consult cancelled  77, MD 11-22-2019, 7:08 AM

## 2019-12-02 DEATH — deceased

## 2019-12-06 LAB — ACID FAST CULTURE WITH REFLEXED SENSITIVITIES (MYCOBACTERIA): Acid Fast Culture: NEGATIVE

## 2019-12-11 LAB — FUNGUS CULTURE WITH STAIN

## 2019-12-11 LAB — FUNGUS CULTURE RESULT

## 2019-12-11 LAB — FUNGAL ORGANISM REFLEX

## 2019-12-25 LAB — ACID FAST CULTURE WITH REFLEXED SENSITIVITIES (MYCOBACTERIA): Acid Fast Culture: NEGATIVE

## 2020-10-20 IMAGING — DX DG CHEST 1V PORT
1 series · 1 of 1 positions shown · non-contrast
Comparison: Chest x-rays dated 11/04/2019 and 05/30/2020 and chest
CT dated 10/20/2019

CLINICAL DATA: Acute respiratory failure.

EXAM:
PORTABLE CHEST 1 VIEW

[chest]
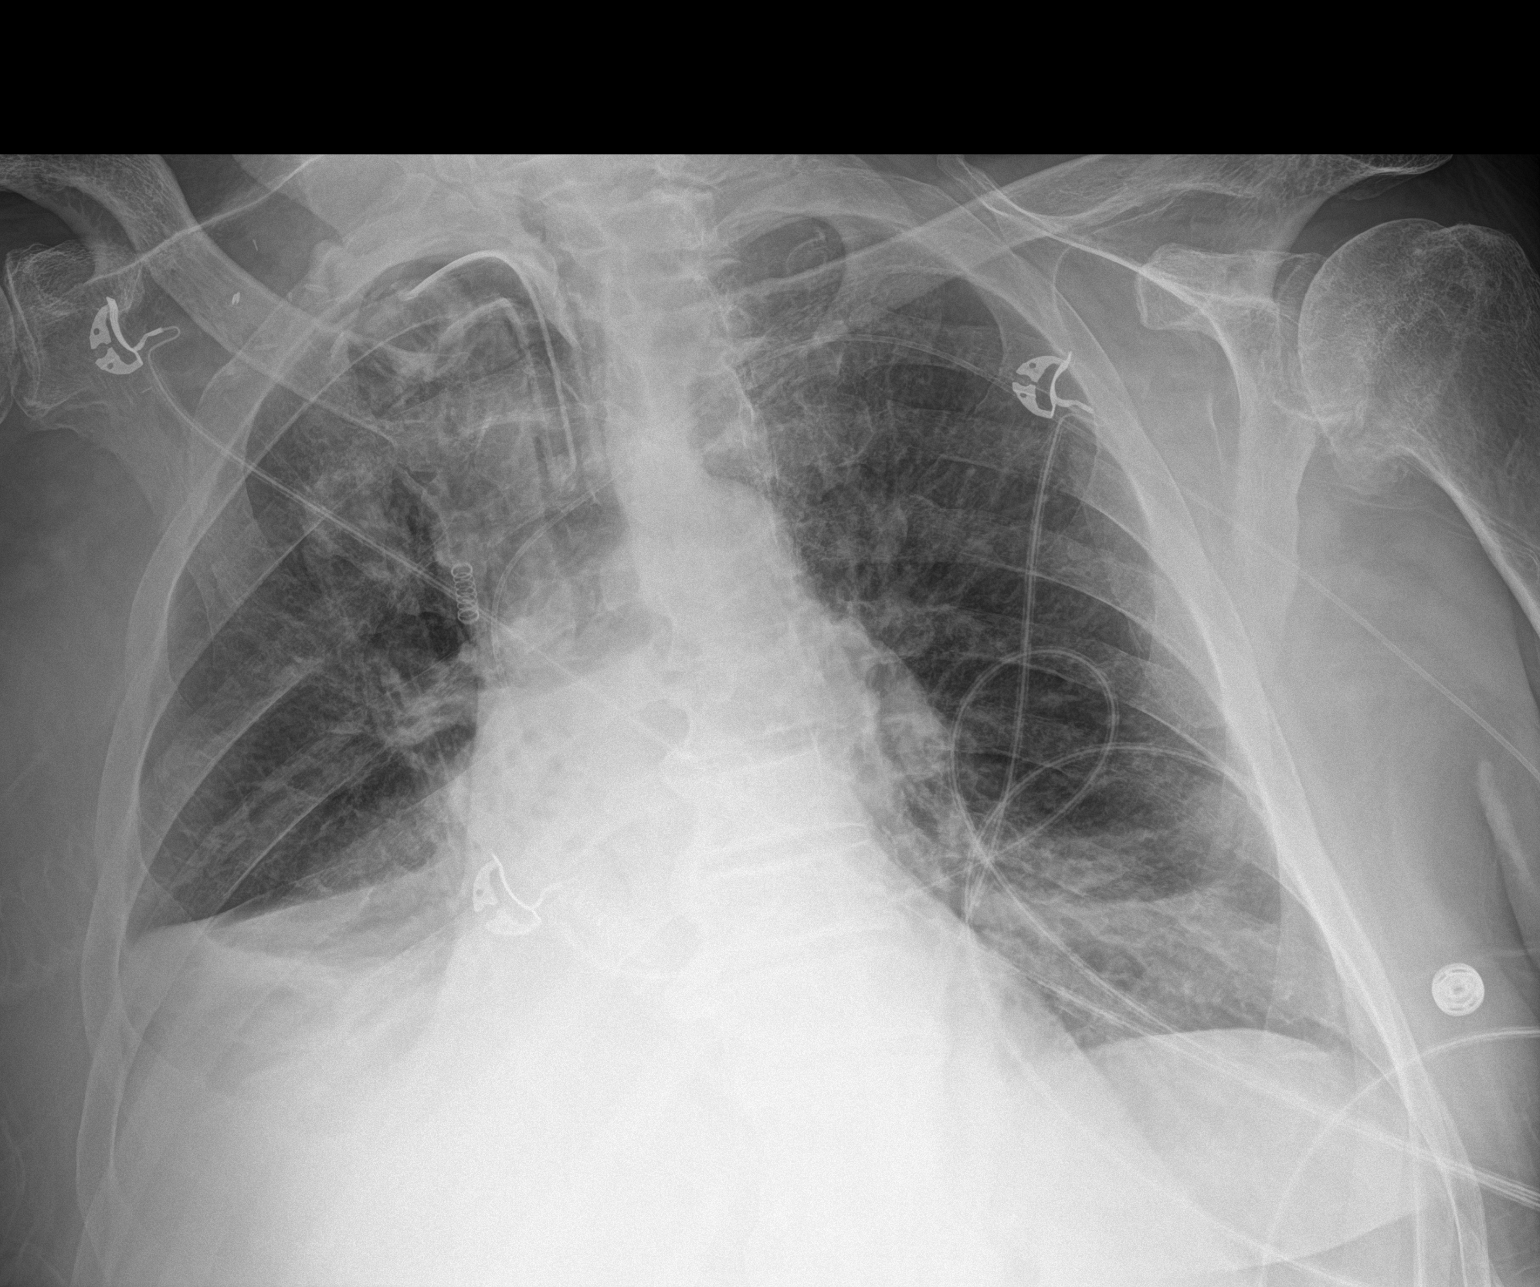

[1 of 1 positions shown; findings below may reference images not displayed]

FINDINGS: Tracheostomy tube in place. Diminished right pleural effusion.
Persistent atelectasis,/consolidation at the right lung base with a
small residual effusion.

New slight linear atelectasis at the left lung base.

The heart size and pulmonary vascularity are normal.

No acute bone abnormalities.
IMPRESSION: Diminished right effusion. Persistent atelectasis/consolidation at
the right lung base. New minimal atelectasis at the left lung base.

## 2020-10-24 IMAGING — DX DG CHEST 1V PORT
1 series · 1 of 1 positions shown · non-contrast
Comparison: 11/10/2019

CLINICAL DATA: Respiratory failure

EXAM:
PORTABLE CHEST 1 VIEW

[chest]
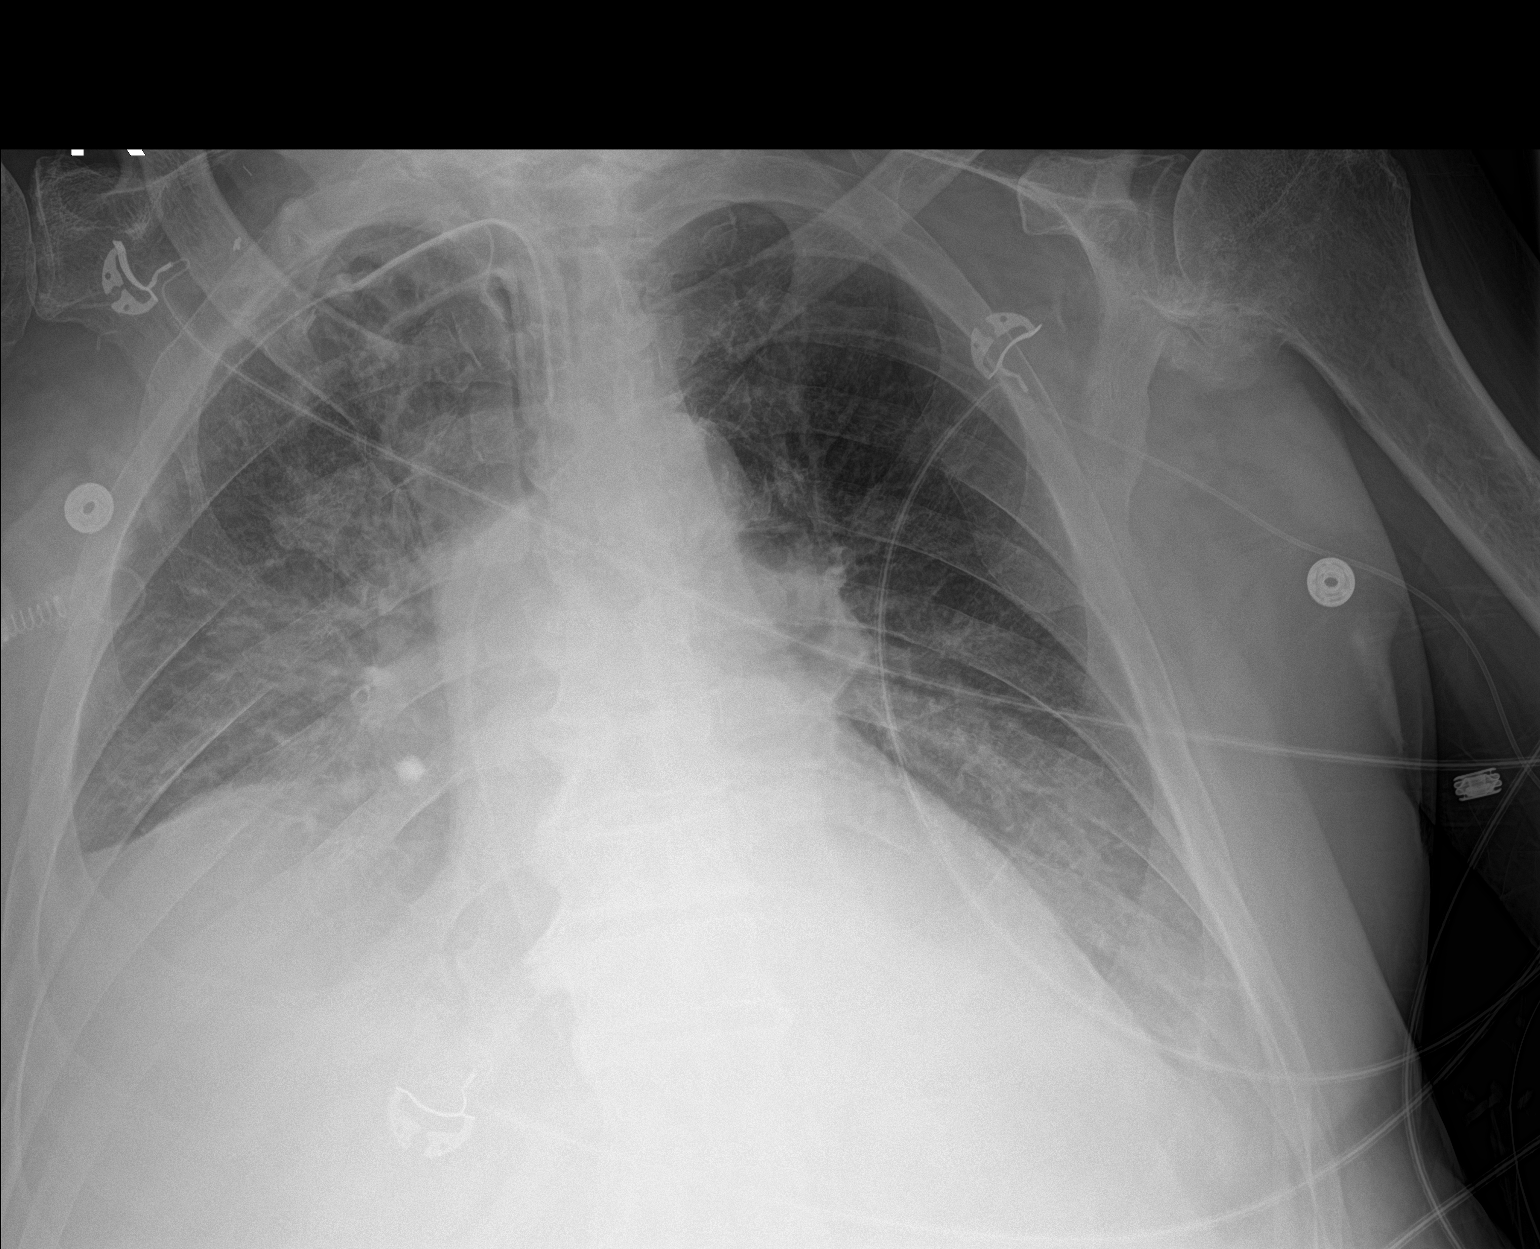

[1 of 1 positions shown; findings below may reference images not displayed]

FINDINGS: Tracheostomy remains in place, unchanged. Cardiomegaly. Bilateral
airspace disease, worsening on the left since prior study. Small
bilateral effusions. No acute bony abnormality.
IMPRESSION: Small bilateral effusions and bilateral airspace disease. Effusion
and airspace opacity worsening on the left since prior study. No
change on the right.

## 2020-10-24 IMAGING — DX DG CHEST 1V PORT
1 series · 1 of 1 positions shown · non-contrast
Comparison: 11/06/2019

CLINICAL DATA: Acute respiratory failure.

EXAM:
PORTABLE CHEST 1 VIEW

[chest ap]
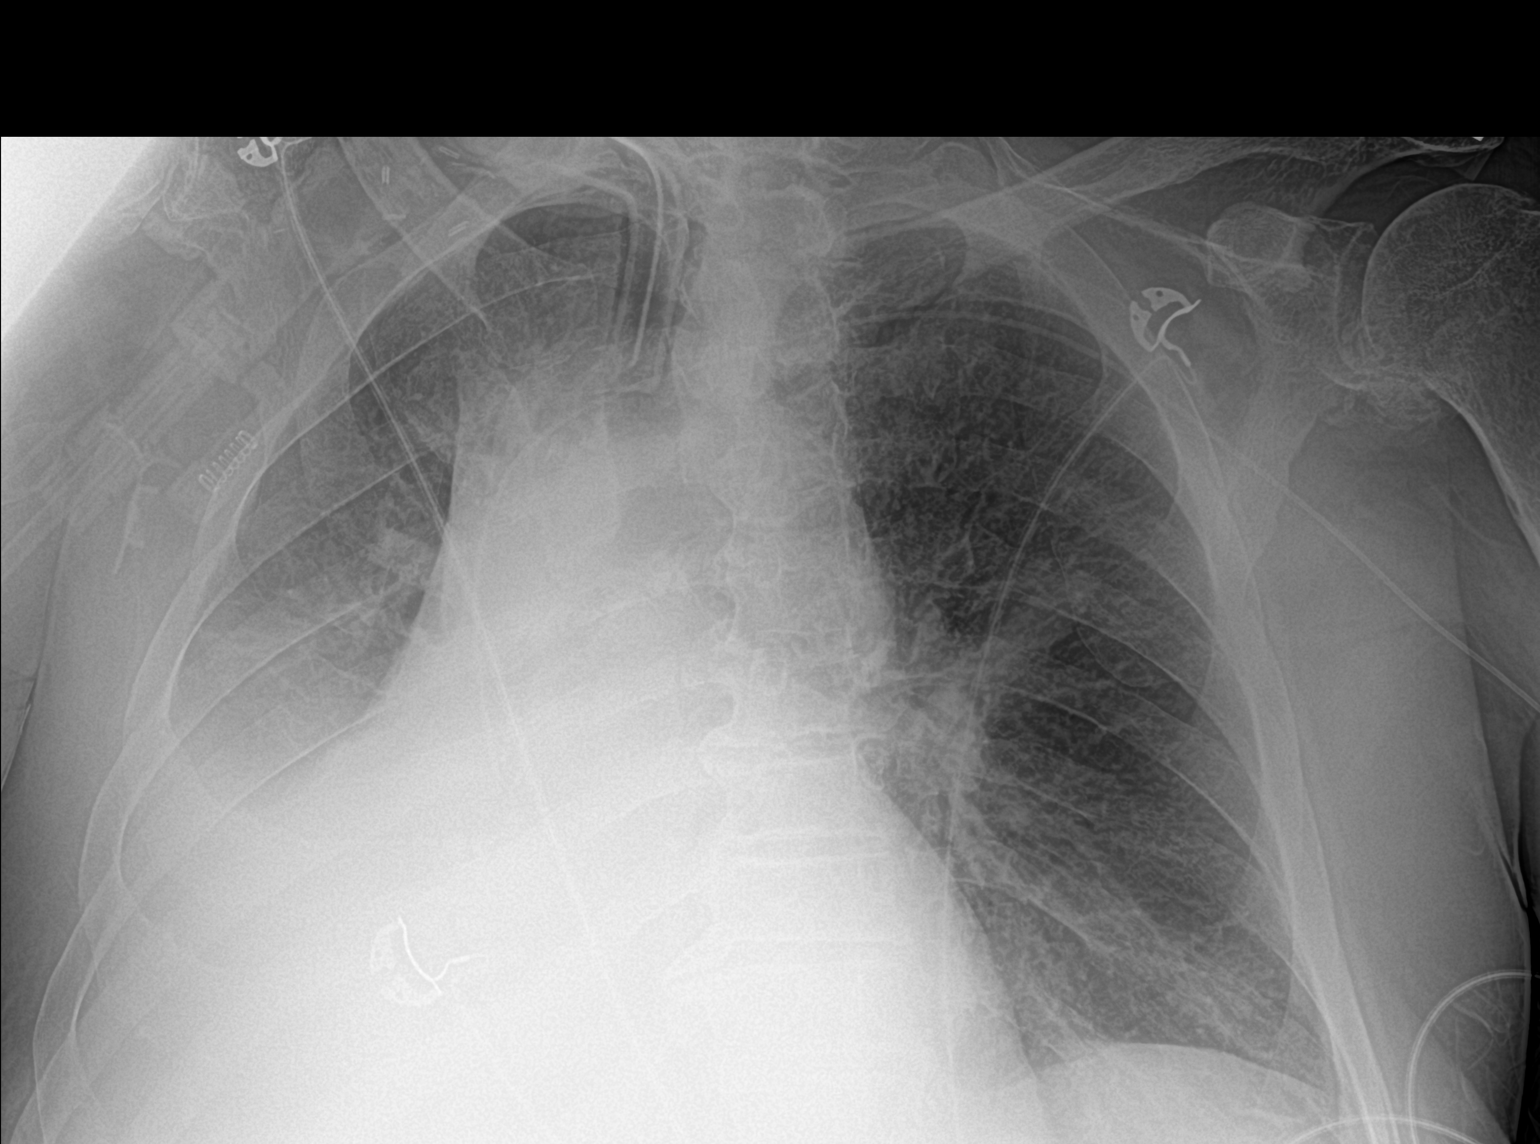

[1 of 1 positions shown; findings below may reference images not displayed]

FINDINGS: The tracheostomy tube is stable.

Persistent dense right lower lobe airspace consolidation and right
pleural effusion. The left lung remains relatively clear.
IMPRESSION: Persistent dense right lower lobe airspace consolidation and right
pleural effusion.
